# Patient Record
Sex: Male | Born: 1960 | Race: Black or African American | Hispanic: No | Marital: Single | Smoking: Never smoker
Health system: Southern US, Community
[De-identification: ages and names within clinical notes are randomized; demographics above are authoritative.]

## PROBLEM LIST (undated history)

## (undated) DIAGNOSIS — F209 Schizophrenia, unspecified: Secondary | ICD-10-CM

## (undated) DIAGNOSIS — E119 Type 2 diabetes mellitus without complications: Secondary | ICD-10-CM

## (undated) DIAGNOSIS — I10 Essential (primary) hypertension: Secondary | ICD-10-CM

## (undated) DIAGNOSIS — F79 Unspecified intellectual disabilities: Secondary | ICD-10-CM

---

## 2003-10-08 ENCOUNTER — Other Ambulatory Visit: Payer: Self-pay

## 2004-06-18 ENCOUNTER — Inpatient Hospital Stay: Payer: Self-pay | Admitting: Internal Medicine

## 2004-06-18 ENCOUNTER — Other Ambulatory Visit: Payer: Self-pay

## 2005-10-27 ENCOUNTER — Emergency Department: Payer: Self-pay | Admitting: Emergency Medicine

## 2005-10-27 ENCOUNTER — Other Ambulatory Visit: Payer: Self-pay

## 2006-01-13 ENCOUNTER — Inpatient Hospital Stay: Payer: Self-pay | Admitting: Psychiatry

## 2006-09-28 ENCOUNTER — Emergency Department: Payer: Self-pay | Admitting: General Practice

## 2006-09-28 ENCOUNTER — Other Ambulatory Visit: Payer: Self-pay

## 2006-10-13 ENCOUNTER — Other Ambulatory Visit: Payer: Self-pay

## 2006-10-13 ENCOUNTER — Emergency Department: Payer: Self-pay | Admitting: Internal Medicine

## 2007-06-23 ENCOUNTER — Other Ambulatory Visit: Payer: Self-pay

## 2007-06-23 ENCOUNTER — Emergency Department: Payer: Self-pay | Admitting: Emergency Medicine

## 2008-01-04 ENCOUNTER — Emergency Department: Payer: Self-pay | Admitting: Emergency Medicine

## 2008-01-04 ENCOUNTER — Other Ambulatory Visit: Payer: Self-pay

## 2008-01-18 ENCOUNTER — Other Ambulatory Visit: Payer: Self-pay

## 2008-01-18 ENCOUNTER — Emergency Department: Payer: Self-pay | Admitting: Emergency Medicine

## 2008-01-19 ENCOUNTER — Other Ambulatory Visit: Payer: Self-pay

## 2008-01-19 ENCOUNTER — Inpatient Hospital Stay: Payer: Self-pay | Admitting: Internal Medicine

## 2009-06-27 ENCOUNTER — Emergency Department: Payer: Self-pay | Admitting: Emergency Medicine

## 2010-01-26 ENCOUNTER — Emergency Department: Payer: Self-pay | Admitting: Emergency Medicine

## 2010-09-10 ENCOUNTER — Emergency Department: Payer: Self-pay | Admitting: Emergency Medicine

## 2011-10-03 ENCOUNTER — Ambulatory Visit: Payer: Self-pay | Admitting: Pain Medicine

## 2011-12-05 ENCOUNTER — Ambulatory Visit: Payer: Self-pay | Admitting: Pain Medicine

## 2012-01-29 ENCOUNTER — Ambulatory Visit: Payer: Self-pay | Admitting: Pain Medicine

## 2012-05-07 ENCOUNTER — Ambulatory Visit: Payer: Self-pay | Admitting: Pain Medicine

## 2012-07-29 ENCOUNTER — Ambulatory Visit: Payer: Self-pay | Admitting: Pain Medicine

## 2012-12-09 ENCOUNTER — Ambulatory Visit: Payer: Self-pay | Admitting: Pain Medicine

## 2012-12-31 ENCOUNTER — Ambulatory Visit: Payer: Self-pay | Admitting: Pain Medicine

## 2014-06-01 ENCOUNTER — Emergency Department: Payer: Self-pay | Admitting: Emergency Medicine

## 2014-06-01 LAB — COMPREHENSIVE METABOLIC PANEL
Albumin: 3.4 g/dL (ref 3.4–5.0)
Alkaline Phosphatase: 111 U/L
Anion Gap: 4 — ABNORMAL LOW (ref 7–16)
BILIRUBIN TOTAL: 0.4 mg/dL (ref 0.2–1.0)
BUN: 22 mg/dL — ABNORMAL HIGH (ref 7–18)
CALCIUM: 8.9 mg/dL (ref 8.5–10.1)
Chloride: 100 mmol/L (ref 98–107)
Co2: 35 mmol/L — ABNORMAL HIGH (ref 21–32)
Creatinine: 1.46 mg/dL — ABNORMAL HIGH (ref 0.60–1.30)
EGFR (African American): >60
GFR CALC NON AF AMER: 54 — AB
Glucose: 188 mg/dL — ABNORMAL HIGH (ref 65–99)
Osmolality: 286 (ref 275–301)
Potassium: 3.6 mmol/L (ref 3.5–5.1)
SGOT(AST): 23 U/L (ref 15–37)
SGPT (ALT): 19 U/L
Sodium: 139 mmol/L (ref 136–145)
Total Protein: 7.8 g/dL (ref 6.4–8.2)

## 2014-06-01 LAB — CBC WITH DIFFERENTIAL/PLATELET
Basophil #: 0 10*3/uL (ref 0.0–0.1)
Basophil %: 0.2 %
EOS PCT: 0.1 %
Eosinophil #: 0 10*3/uL (ref 0.0–0.7)
HCT: 39.2 % — ABNORMAL LOW (ref 40.0–52.0)
HGB: 12.6 g/dL — AB (ref 13.0–18.0)
Lymphocyte #: 1.5 10*3/uL (ref 1.0–3.6)
Lymphocyte %: 11.5 %
MCH: 29.8 pg (ref 26.0–34.0)
MCHC: 32 g/dL (ref 32.0–36.0)
MCV: 93 fL (ref 80–100)
Monocyte #: 1.1 x10 3/mm — ABNORMAL HIGH (ref 0.2–1.0)
Monocyte %: 8.4 %
NEUTROS ABS: 10.2 10*3/uL — AB (ref 1.4–6.5)
Neutrophil %: 79.8 %
Platelet: 191 10*3/uL (ref 150–440)
RBC: 4.22 10*6/uL — AB (ref 4.40–5.90)
RDW: 14.4 % (ref 11.5–14.5)
WBC: 12.8 10*3/uL — ABNORMAL HIGH (ref 3.8–10.6)

## 2014-06-01 LAB — URINALYSIS, COMPLETE
Bacteria: NONE SEEN
Bilirubin,UR: NEGATIVE
Ketone: NEGATIVE
Leukocyte Esterase: NEGATIVE
Nitrite: NEGATIVE
PH: 5 (ref 4.5–8.0)
PROTEIN: NEGATIVE
RBC,UR: <1 /HPF (ref 0–5)
SPECIFIC GRAVITY: 1.006 (ref 1.003–1.030)
SQUAMOUS EPITHELIAL: NONE SEEN
WBC UR: NONE SEEN /HPF (ref 0–5)

## 2017-02-06 ENCOUNTER — Encounter: Payer: Self-pay | Admitting: Emergency Medicine

## 2017-02-06 ENCOUNTER — Emergency Department
Admission: EM | Admit: 2017-02-06 | Discharge: 2017-02-06 | Disposition: A | Discharge status: Home / Self Care | Payer: Medicare Other | Attending: Emergency Medicine | Admitting: Emergency Medicine

## 2017-02-06 DIAGNOSIS — E119 Type 2 diabetes mellitus without complications: Secondary | ICD-10-CM | POA: Diagnosis not present

## 2017-02-06 DIAGNOSIS — Z7982 Long term (current) use of aspirin: Secondary | ICD-10-CM | POA: Insufficient documentation

## 2017-02-06 DIAGNOSIS — Z794 Long term (current) use of insulin: Secondary | ICD-10-CM | POA: Insufficient documentation

## 2017-02-06 DIAGNOSIS — I10 Essential (primary) hypertension: Secondary | ICD-10-CM | POA: Diagnosis not present

## 2017-02-06 DIAGNOSIS — Z79899 Other long term (current) drug therapy: Secondary | ICD-10-CM | POA: Diagnosis not present

## 2017-02-06 DIAGNOSIS — X30XXXA Exposure to excessive natural heat, initial encounter: Secondary | ICD-10-CM | POA: Diagnosis not present

## 2017-02-06 DIAGNOSIS — E860 Dehydration: Secondary | ICD-10-CM | POA: Diagnosis present

## 2017-02-06 DIAGNOSIS — F79 Unspecified intellectual disabilities: Secondary | ICD-10-CM | POA: Insufficient documentation

## 2017-02-06 DIAGNOSIS — T679XXA Effect of heat and light, unspecified, initial encounter: Secondary | ICD-10-CM

## 2017-02-06 HISTORY — DX: Type 2 diabetes mellitus without complications: E11.9

## 2017-02-06 HISTORY — DX: Essential (primary) hypertension: I10

## 2017-02-06 HISTORY — DX: Unspecified intellectual disabilities: F79

## 2017-02-06 LAB — CBC
HEMATOCRIT: 36.6 % — AB (ref 40.0–52.0)
HEMOGLOBIN: 12.3 g/dL — AB (ref 13.0–18.0)
MCH: 30.4 pg (ref 26.0–34.0)
MCHC: 33.4 g/dL (ref 32.0–36.0)
MCV: 90.9 fL (ref 80.0–100.0)
Platelets: 234 10*3/uL (ref 150–440)
RBC: 4.03 MIL/uL — ABNORMAL LOW (ref 4.40–5.90)
RDW: 14.9 % — AB (ref 11.5–14.5)
WBC: 5.3 10*3/uL (ref 3.8–10.6)

## 2017-02-06 LAB — BASIC METABOLIC PANEL
ANION GAP: 6 (ref 5–15)
BUN: 23 mg/dL — AB (ref 6–20)
CALCIUM: 9.4 mg/dL (ref 8.9–10.3)
CO2: 29 mmol/L (ref 22–32)
Chloride: 100 mmol/L — ABNORMAL LOW (ref 101–111)
Creatinine, Ser: 1.26 mg/dL — ABNORMAL HIGH (ref 0.61–1.24)
GFR calc Af Amer: >60 mL/min (ref >60)
Glucose, Bld: 228 mg/dL — ABNORMAL HIGH (ref 65–99)
Potassium: 4.3 mmol/L (ref 3.5–5.1)
Sodium: 135 mmol/L (ref 135–145)

## 2017-02-06 LAB — TROPONIN I: Troponin I: <0.03 ng/mL (ref <0.03)

## 2017-02-06 MED ORDER — ONDANSETRON HCL 4 MG/2ML IJ SOLN
4.0000 mg | Freq: Once | INTRAMUSCULAR | Status: AC
  Administered 2017-02-06: 4 mg via INTRAVENOUS
  Filled 2017-02-06: qty 2

## 2017-02-06 MED ORDER — SODIUM CHLORIDE 0.9 % IV BOLUS (SEPSIS)
1000.0000 mL | Freq: Once | INTRAVENOUS | Status: AC
  Administered 2017-02-06: 1000 mL via INTRAVENOUS

## 2017-02-06 NOTE — ED Notes (Signed)
Pt left with caregiver in wheelchair.

## 2017-02-06 NOTE — Discharge Instructions (Signed)
Stay well-hydrated be very careful in hot environments, and return to the emergency room for any new or worrisome symptoms

## 2017-02-06 NOTE — ED Provider Notes (Signed)
-----------------------------------------   4:27 PM on 02/06/2017 -----------------------------------------  Administration in no acute distress, he probably was outside in the heat. He had no complaints but family is worried that he might be dehydrated. The plan, according to prior provider, is a discharge patient if he is asymptomatic and his blood work is reassuring, blood work is reassuring, he is in no acute distress. He has not given us a urine sample. I have asked the family if they want to stay for one and they do not. They're requesting immediate discharge. Given that he is well-appearing with no further symptoms we will discharge at the family's request with close outpatient follow-up and instructions to continue hydration at home.   Jeanmarie PlantMcShane, Yosef Krogh A, MD 02/06/17 [phone]

## 2017-02-06 NOTE — ED Notes (Signed)
pts caregiver standing in doorway, states "we are ready to go home, he is better", Dr. Alphonzo LemmingsMcShane made aware

## 2017-02-06 NOTE — ED Notes (Signed)
EDP at bedside  

## 2017-02-06 NOTE — ED Provider Notes (Signed)
[facility] Emergency Department Provider Note  ____________________________________________  Time seen: Approximately 2:25 PM  I have reviewed the triage vital signs and the nursing notes.   HISTORY  Chief Complaint Dehydration  Level 5 caveat:  Portions of the history and physical were unable to be obtained due to mental retardation   HPI Larry Cook is a 56 y.o. male with a history of mental disability, diabetes and hypertension who presents with his caregiver/sister for concerns of dehydration. According to the sister patient snuck out of the house today and spent the whole day outside in the 90 heat. She noticed that patient was not acting like himself and seemed a little bit more confused than his baseline which prompted his visit to the emergency room. Patient denies any complaints at this time. He denies headache, chest pain, shortness of breath, abdominal pain, nausea, vomiting, dysuria hematuria. According to the caretaker patient had one episode of vomiting yesterday however that has resolved. He has had normal appetite and has eaten normally today.  Past Medical History:  Diagnosis Date  . Diabetes mellitus without complication (HCC)   . Hypertension   . Mentally disabled     There are no active problems to display for this patient.   History reviewed. No pertinent surgical history.  Prior to Admission medications   Medication Sig Start Date End Date Taking? Authorizing Provider  albuterol (PROVENTIL) (2.5 MG/3ML) 0.083% nebulizer solution Inhale 3 mLs into the lungs every 4 (four) hours as needed for wheezing. 12/12/16  Yes [provider]  ALPRAZolam (XANAX) 1 MG tablet Take 1 mg by mouth 3 (three) times daily as needed for sleep. 12/23/16  Yes [provider]  amLODipine (NORVASC) 5 MG tablet Take 5 mg by mouth daily. 01/30/17  Yes [provider]  aspirin EC 81 MG tablet Take 81 mg by mouth daily.   Yes  [provider]  gabapentin (NEURONTIN) 100 MG capsule Take 100 mg by mouth 2 (two) times daily. 12/12/16  Yes [provider]  HYDROcodone-acetaminophen (NORCO) 10-325 MG tablet Take 1 tablet by mouth every 6 (six) hours as needed for pain. 01/08/17  Yes [provider]  insulin glargine (LANTUS) 100 UNIT/ML injection Inject 20 Units into the skin at bedtime.   Yes [provider]  lovastatin (MEVACOR) 40 MG tablet Take 40 mg by mouth daily with supper. 01/03/17  Yes [provider]  meloxicam (MOBIC) 7.5 MG tablet Take 7.5 mg by mouth at bedtime. 01/30/17  Yes [provider]  metFORMIN (GLUCOPHAGE) 1000 MG tablet Take 1,000 mg by mouth 2 (two) times daily. 01/03/17  Yes [provider]  Olopatadine HCl 0.2 % SOLN Place 1 drop into both eyes daily. 01/28/17  Yes [provider]  omeprazole (PRILOSEC) 20 MG capsule Take 20 mg by mouth 2 (two) times daily. 01/30/17  Yes [provider]  pramipexole (MIRAPEX) 0.25 MG tablet Take 0.25 mg by mouth at bedtime. 05/06/15  Yes [provider]    Allergies Patient has no known allergies.  No family history on file.  Social History Social History  Substance Use Topics  . Smoking status: Never Smoker  . Smokeless tobacco: Never Used  . Alcohol use No    Review of Systems  Constitutional: Negative for fever. + confused Eyes: Negative for visual changes. ENT: Negative for sore throat. Neck: No neck pain  Cardiovascular: Negative for chest pain. Respiratory: Negative for shortness of breath. Gastrointestinal: Negative for abdominal pain,  diarrhea. + vomiting Genitourinary: Negative for dysuria. Musculoskeletal: Negative for back pain. Skin: Negative for rash. Neurological: Negative for headaches, weakness or numbness. Psych: No SI or HI  ____________________________________________   PHYSICAL EXAM:  VITAL SIGNS: ED Triage Vitals [02/06/17 1345]  Enc  Vitals Group     BP (!) 154/89     Pulse Rate 86     Resp 20     Temp 97.7 F (36.5 C)     Temp Source Oral     SpO2 97 %     Weight 206 lb (93.4 kg)     Height 4' (1.219 m)     Head Circumference      Peak Flow      Pain Score      Pain Loc      Pain Edu?      Excl. in GC?     Constitutional: Alert and oriented x2 which is baseline, when I asked him if he knew where he was he  Looked at me and said "yes, I am in the hospital". no apparent distress. HEENT:      Head: Normocephalic and atraumatic.         Eyes: Conjunctivae are normal. Sclera is non-icteric.       Mouth/Throat: Mucous membranes are moist.       Neck: Supple with no signs of meningismus. Cardiovascular: Regular rate and rhythm. No murmurs, gallops, or rubs. 2+ symmetrical distal pulses are present in all extremities. No JVD. Respiratory: Normal respiratory effort. Lungs are clear to auscultation bilaterally. No wheezes, crackles, or rhonchi.  Gastrointestinal: Soft, non tender, and non distended with positive bowel sounds. No rebound or guarding. Genitourinary: No CVA tenderness. Musculoskeletal: Nontender with normal range of motion in all extremities. No edema, cyanosis, or erythema of extremities. Neurologic: Normal speech and language. Face is symmetric. Moving all extremities. No gross focal neurologic deficits are appreciated. Skin: Skin is warm, dry and intact. No rash noted. Psychiatric: Mood and affect are normal. Speech and behavior are normal.  ____________________________________________   LABS (all labs ordered are listed, but only abnormal results are displayed)  Labs Reviewed  BASIC METABOLIC PANEL - Abnormal; Notable for the following:       Result Value   Chloride 100 (*)    Glucose, Bld 228 (*)    BUN 23 (*)    Creatinine, Ser 1.26 (*)    All other components within normal limits  CBC - Abnormal; Notable for the following:    RBC 4.03 (*)    Hemoglobin 12.3 (*)    HCT 36.6 (*)    RDW  14.9 (*)    All other components within normal limits  TROPONIN I   ____________________________________________  EKG  ED ECG REPORT I, Nita Sickle, the attending physician, personally viewed and interpreted this ECG.  Sinus rhythm, rate of 86, right bundle branch block, normal QTC, left axis deviation, no ST elevations or depressions. EKG is unchanged from prior from 2009 ____________________________________________  RADIOLOGY  none  ____________________________________________   PROCEDURES  Procedure(s) performed: None Procedures Critical Care performed:  None ____________________________________________   INITIAL IMPRESSION / ASSESSMENT AND PLAN / ED COURSE  56 y.o. male with a history of mental disability, diabetes and hypertension who presents with his caregiver/sister for concerns of dehydration. Patient is extremely well appearing, pleasant, smiling, has no complaints at this time, his vitals are within normal limits, his face symmetric, is moving all of his extremities and following commands. Abdomen is soft  and nontender, lungs are clear to auscultation. We'll give IV fluids and check basic blood work including urinalysis for any signs of dehydration or infection.    Care transferred to Dr. Alphonzo LemmingsMcShane at [facility] with labs pending     Pertinent labs & imaging results that were available during my care of the patient were reviewed by me and considered in my medical decision making (see chart for details).    ____________________________________________   FINAL CLINICAL IMPRESSION(S) / ED DIAGNOSES  Final diagnoses:  Heat exposure, initial encounter      NEW MEDICATIONS STARTED DURING THIS VISIT:  Discharge Medication List as of 02/06/2017  4:29 PM       Note:  This document was prepared using Dragon voice recognition software and may include unintentional dictation errors.    Don PerkingVeronese, [place], MD 02/09/17 1146

## 2017-02-06 NOTE — ED Triage Notes (Signed)
Caregiver states that pt may be dehydrated from being out in the sun too much. States he is not acting himself.  Pt with disability.

## 2017-02-06 NOTE — ED Notes (Signed)
Pt resting in bed, resp even and unlabored, denies any needs, caregiver states "he looks better, his color is coming back"

## 2017-02-06 NOTE — Progress Notes (Signed)
[facility] rounding unit visited pt. Pt was lying in the bed and sister was at the bedside. Pt's sister said she has been taking care of her brother for 15 years. Pt have difficulty hearing so CH was not able to communicate with him. Pt's sister talked about how she raised 2 adopted children; one is in college and the other one just graduated from high school recently. CH offered prayers for pt and for sister and then left.

## 2017-04-23 ENCOUNTER — Emergency Department
Admission: EM | Admit: 2017-04-23 | Discharge: 2017-04-23 | Disposition: A | Discharge status: Home / Self Care | Payer: Medicare Other | Attending: Emergency Medicine | Admitting: Emergency Medicine

## 2017-04-23 ENCOUNTER — Encounter: Payer: Self-pay | Admitting: Emergency Medicine

## 2017-04-23 DIAGNOSIS — R4182 Altered mental status, unspecified: Secondary | ICD-10-CM | POA: Diagnosis not present

## 2017-04-23 DIAGNOSIS — I10 Essential (primary) hypertension: Secondary | ICD-10-CM | POA: Diagnosis not present

## 2017-04-23 DIAGNOSIS — M5431 Sciatica, right side: Secondary | ICD-10-CM | POA: Insufficient documentation

## 2017-04-23 DIAGNOSIS — E119 Type 2 diabetes mellitus without complications: Secondary | ICD-10-CM | POA: Insufficient documentation

## 2017-04-23 LAB — URINALYSIS, COMPLETE (UACMP) WITH MICROSCOPIC
Bacteria, UA: NONE SEEN
Bilirubin Urine: NEGATIVE
GLUCOSE, UA: NEGATIVE mg/dL
HGB URINE DIPSTICK: NEGATIVE
KETONES UR: NEGATIVE mg/dL
Leukocytes, UA: NEGATIVE
NITRITE: NEGATIVE
PH: 5 (ref 5.0–8.0)
Protein, ur: 100 mg/dL — AB
Specific Gravity, Urine: 1.021 (ref 1.005–1.030)

## 2017-04-23 LAB — COMPREHENSIVE METABOLIC PANEL
ALT: 23 U/L (ref 17–63)
ANION GAP: 10 (ref 5–15)
AST: 31 U/L (ref 15–41)
Albumin: 4.1 g/dL (ref 3.5–5.0)
Alkaline Phosphatase: 77 U/L (ref 38–126)
BUN: 17 mg/dL (ref 6–20)
CHLORIDE: 100 mmol/L — AB (ref 101–111)
CO2: 29 mmol/L (ref 22–32)
Calcium: 9.5 mg/dL (ref 8.9–10.3)
Creatinine, Ser: 1.38 mg/dL — ABNORMAL HIGH (ref 0.61–1.24)
GFR, EST NON AFRICAN AMERICAN: 56 mL/min — AB (ref >60)
Glucose, Bld: 220 mg/dL — ABNORMAL HIGH (ref 65–99)
POTASSIUM: 4.1 mmol/L (ref 3.5–5.1)
Sodium: 139 mmol/L (ref 135–145)
Total Bilirubin: 0.5 mg/dL (ref 0.3–1.2)
Total Protein: 7.5 g/dL (ref 6.5–8.1)

## 2017-04-23 LAB — CBC
HEMATOCRIT: 37.8 % — AB (ref 40.0–52.0)
HEMOGLOBIN: 12.6 g/dL — AB (ref 13.0–18.0)
MCH: 30.5 pg (ref 26.0–34.0)
MCHC: 33.4 g/dL (ref 32.0–36.0)
MCV: 91.4 fL (ref 80.0–100.0)
Platelets: 223 10*3/uL (ref 150–440)
RBC: 4.14 MIL/uL — AB (ref 4.40–5.90)
RDW: 14.2 % (ref 11.5–14.5)
WBC: 5.4 10*3/uL (ref 3.8–10.6)

## 2017-04-23 LAB — GLUCOSE, CAPILLARY: Glucose-Capillary: 209 mg/dL — ABNORMAL HIGH (ref 65–99)

## 2017-04-23 LAB — TROPONIN I

## 2017-04-23 NOTE — ED Notes (Signed)
Pt caregiver states that he has been disoriented today and that he is having problems with his sciatic nerve today - she states that the pt has stood in the sun today and she thinks this may be making him disoriented - caregiver states that pt will not communicate with staff like he normally does

## 2017-04-23 NOTE — ED Provider Notes (Signed)
[facility] Emergency Department Provider Note  ____________________________________________   First MD Initiated Contact with Patient 04/23/17 2124     (approximate)  I have reviewed the triage vital signs and the nursing notes.   HISTORY  Chief Complaint Altered Mental Status   HPI Larry Cook is a 56 y.o. male with history of diabetes, hypertension and cognitive disability who is presenting to the emergency department today with altered mental status as well as suspicion of pain from right-sided sciatica. He is accompanied by his sister who is also his caretaker was concerned that he may be dehydrated from standing out in the sun for the past several days not drink enough water. She says that he has been fatigued from this in the past. She says that he is drinking and eating normally. No diarrhea. Today he was crying earlier today and she was suspecting this could be from right-sided sciatica for which he is using Vicodin intermittently.   Past Medical History:  Diagnosis Date  . Diabetes mellitus without complication (HCC)   . Hypertension   . Mentally disabled     There are no active problems to display for this patient.   History reviewed. No pertinent surgical history.  Prior to Admission medications   Medication Sig Start Date End Date Taking? Authorizing Provider  albuterol (PROVENTIL) (2.5 MG/3ML) 0.083% nebulizer solution Inhale 3 mLs into the lungs every 4 (four) hours as needed for wheezing. 12/12/16   [provider]  ALPRAZolam Prudy Feeler(XANAX) 1 MG tablet Take 1 mg by mouth 3 (three) times daily as needed for sleep. 12/23/16   [provider]  amLODipine (NORVASC) 5 MG tablet Take 5 mg by mouth daily. 01/30/17   [provider]  aspirin EC 81 MG tablet Take 81 mg by mouth daily.    [provider]  gabapentin (NEURONTIN) 100 MG capsule Take 100 mg by mouth 2 (two) times daily. 12/12/16   [provider]    HYDROcodone-acetaminophen (NORCO) 10-325 MG tablet Take 1 tablet by mouth every 6 (six) hours as needed for pain. 01/08/17   [provider]  insulin glargine (LANTUS) 100 UNIT/ML injection Inject 20 Units into the skin at bedtime.    [provider]  lovastatin (MEVACOR) 40 MG tablet Take 40 mg by mouth daily with supper. 01/03/17   [provider]  meloxicam (MOBIC) 7.5 MG tablet Take 7.5 mg by mouth at bedtime. 01/30/17   [provider]  metFORMIN (GLUCOPHAGE) 1000 MG tablet Take 1,000 mg by mouth 2 (two) times daily. 01/03/17   [provider]  Olopatadine HCl 0.2 % SOLN Place 1 drop into both eyes daily. 01/28/17   [provider]  omeprazole (PRILOSEC) 20 MG capsule Take 20 mg by mouth 2 (two) times daily. 01/30/17   [provider]  pramipexole (MIRAPEX) 0.25 MG tablet Take 0.25 mg by mouth at bedtime. 05/06/15   [provider]    Allergies Patient has no known allergies.  No family history on file.  Social History Social History  Substance Use Topics  . Smoking status: Never Smoker  . Smokeless tobacco: Never Used  . Alcohol use No    Review of Systems  Level V caveat secondary to patient minimally verbal and with mental disability.   ____________________________________________   PHYSICAL EXAM:  VITAL SIGNS: ED Triage Vitals  Enc Vitals Group     BP 04/23/17 1947 (!) 153/91     Pulse Rate 04/23/17 1947 90  Resp 04/23/17 1947 20     Temp 04/23/17 1947 99.3 F (37.4 C)     Temp Source 04/23/17 1947 Oral     SpO2 04/23/17 1947 97 %     Weight 04/23/17 1948 210 lb (95.3 kg)     Height 04/23/17 1948 4' (1.219 m)     Head Circumference --      Peak Flow --      Pain Score --      Pain Loc --      Pain Edu? --      Excl. in GC? --     Constitutional: Alert and in no acute distress. Eyes: Conjunctivae are normal.  Head: Atraumatic. Nose: No congestion/rhinnorhea. Mouth/Throat: Mucous  membranes are moist.  Neck: No stridor.   Cardiovascular: Normal rate, regular rhythm. Grossly normal heart sounds.  Respiratory: Normal respiratory effort.  No retractions. Lungs CTAB. Gastrointestinal: Soft and nontender. No distention.  Musculoskeletal: No lower extremity tenderness nor edema.  No joint effusions. Neurologic:   No gross focal neurologic deficits are appreciated. Skin:  Skin is warm, dry and intact. No rash noted. Psychiatric: Mood and affect are normal. Speech and behavior are normal.  ____________________________________________   LABS (all labs ordered are listed, but only abnormal results are displayed)  Labs Reviewed  COMPREHENSIVE METABOLIC PANEL - Abnormal; Notable for the following:       Result Value   Chloride 100 (*)    Glucose, Bld 220 (*)    Creatinine, Ser 1.38 (*)    GFR calc non Af Amer 56 (*)    All other components within normal limits  CBC - Abnormal; Notable for the following:    RBC 4.14 (*)    Hemoglobin 12.6 (*)    HCT 37.8 (*)    All other components within normal limits  URINALYSIS, COMPLETE (UACMP) WITH MICROSCOPIC - Abnormal; Notable for the following:    Color, Urine YELLOW (*)    APPearance CLEAR (*)    Protein, ur 100 (*)    Squamous Epithelial / LPF 0-5 (*)    All other components within normal limits  GLUCOSE, CAPILLARY - Abnormal; Notable for the following:    Glucose-Capillary 209 (*)    All other components within normal limits  TROPONIN I  CBG MONITORING, ED   ____________________________________________  EKG  ED ECG REPORT I, Arelia Longest, the attending physician, personally viewed and interpreted this ECG.   Date: 04/23/2017  EKG Time: 2026  Rate: 81  Rhythm: normal sinus rhythm  Axis: Normal  Intervals:Bifascicular block  ST&T Change: No ST segment elevation or depression. No abnormal T-wave inversion. No significant change from  previous.  ____________________________________________  RADIOLOGY   ____________________________________________   PROCEDURES  Procedure(s) performed:   Procedures  Critical Care performed:   ____________________________________________   INITIAL IMPRESSION / ASSESSMENT AND PLAN / ED COURSE  Pertinent labs & imaging results that were available during my care of the patient were reviewed by me and considered in my medical decision making (see chart for details).  Reassuring blood work. Patient with a negative straight leg raise bilaterally. No tenderness to the low lumbar region. Sister says that he is acting at his baseline at this time. Unclear etiology of the altered mental status. However, the condition appears to be resolved. We discussed keeping the patient out of the sauna make sure these drinking plenty of fluids as it will also be out the next several days. The patient has a follow-up appointment  scheduled with Dr. Judithann Sheen on September 19. However, the patient's sister says that she'll attempt to have it moved up closer. She is understanding of the plan and willing to comply with discharge. She understands to bring the patient back for any worsening or concerning symptoms.      ____________________________________________   FINAL CLINICAL IMPRESSION(S) / ED DIAGNOSES  Altered mental status, resolved   NEW MEDICATIONS STARTED DURING THIS VISIT:  New Prescriptions   No medications on file     Note:  This document was prepared using Dragon voice recognition software and may include unintentional dictation errors.     Myrna Blazer, MD 04/23/17 2218

## 2017-04-23 NOTE — ED Triage Notes (Signed)
Patient to ER with sister (caregiver) for c/o AMS. Patient has h/o mental disability, but "has not been acting right" last couple of days. Patient's sister states patient has seemed more lethargic than normal and BP was 181/104 with HR of 9 at home. Blood sugar was under 200 at home.

## 2018-01-04 ENCOUNTER — Emergency Department: Payer: Medicare Other

## 2018-01-04 ENCOUNTER — Emergency Department
Admission: EM | Admit: 2018-01-04 | Discharge: 2018-01-05 | Disposition: A | Discharge status: Home / Self Care | Payer: Medicare Other | Attending: Emergency Medicine | Admitting: Emergency Medicine

## 2018-01-04 DIAGNOSIS — I10 Essential (primary) hypertension: Secondary | ICD-10-CM | POA: Diagnosis not present

## 2018-01-04 DIAGNOSIS — F919 Conduct disorder, unspecified: Secondary | ICD-10-CM | POA: Diagnosis not present

## 2018-01-04 DIAGNOSIS — Z794 Long term (current) use of insulin: Secondary | ICD-10-CM | POA: Insufficient documentation

## 2018-01-04 DIAGNOSIS — E119 Type 2 diabetes mellitus without complications: Secondary | ICD-10-CM | POA: Insufficient documentation

## 2018-01-04 DIAGNOSIS — R410 Disorientation, unspecified: Secondary | ICD-10-CM | POA: Diagnosis not present

## 2018-01-04 DIAGNOSIS — R462 Strange and inexplicable behavior: Secondary | ICD-10-CM

## 2018-01-04 HISTORY — DX: Schizophrenia, unspecified: F20.9

## 2018-01-04 LAB — COMPREHENSIVE METABOLIC PANEL
ALT: 21 U/L (ref 17–63)
AST: 35 U/L (ref 15–41)
Albumin: 4.2 g/dL (ref 3.5–5.0)
Alkaline Phosphatase: 85 U/L (ref 38–126)
Anion gap: 10 (ref 5–15)
BUN: 21 mg/dL — AB (ref 6–20)
CHLORIDE: 102 mmol/L (ref 101–111)
CO2: 25 mmol/L (ref 22–32)
CREATININE: 1.39 mg/dL — AB (ref 0.61–1.24)
Calcium: 9.3 mg/dL (ref 8.9–10.3)
GFR calc Af Amer: >60 mL/min (ref >60)
GFR calc non Af Amer: 55 mL/min — ABNORMAL LOW (ref >60)
Glucose, Bld: 111 mg/dL — ABNORMAL HIGH (ref 65–99)
Potassium: 4.1 mmol/L (ref 3.5–5.1)
Sodium: 137 mmol/L (ref 135–145)
Total Bilirubin: 0.7 mg/dL (ref 0.3–1.2)
Total Protein: 7.8 g/dL (ref 6.5–8.1)

## 2018-01-04 LAB — URINALYSIS, ROUTINE W REFLEX MICROSCOPIC
Bacteria, UA: NONE SEEN
Bilirubin Urine: NEGATIVE
Glucose, UA: NEGATIVE mg/dL
Hgb urine dipstick: NEGATIVE
Ketones, ur: NEGATIVE mg/dL
Leukocytes, UA: NEGATIVE
Nitrite: NEGATIVE
PH: 5 (ref 5.0–8.0)
Protein, ur: 30 mg/dL — AB
SPECIFIC GRAVITY, URINE: 1.014 (ref 1.005–1.030)
SQUAMOUS EPITHELIAL / LPF: NONE SEEN (ref 0–5)
WBC, UA: NONE SEEN WBC/hpf (ref 0–5)

## 2018-01-04 LAB — CBC
HCT: 36.8 % — ABNORMAL LOW (ref 40.0–52.0)
Hemoglobin: 12.3 g/dL — ABNORMAL LOW (ref 13.0–18.0)
MCH: 30.8 pg (ref 26.0–34.0)
MCHC: 33.3 g/dL (ref 32.0–36.0)
MCV: 92.3 fL (ref 80.0–100.0)
PLATELETS: 246 10*3/uL (ref 150–440)
RBC: 3.98 MIL/uL — ABNORMAL LOW (ref 4.40–5.90)
RDW: 14.7 % — AB (ref 11.5–14.5)
WBC: 6.1 10*3/uL (ref 3.8–10.6)

## 2018-01-04 NOTE — ED Notes (Addendum)
Pt here with sister who says pt is confused over the last few days; yesterday she told him to change after being incontinent and pt put on 2 pair of underwear; she says today he'd go into the bathroom and she'd have to go in to check on him and see what he's doing-she'd find him just standing in the bathroom but had used the bathroom on the floor; pt ambulatory with steady gait;

## 2018-01-05 ENCOUNTER — Encounter: Payer: Self-pay | Admitting: Emergency Medicine

## 2018-01-05 LAB — BLOOD GAS, VENOUS
ACID-BASE EXCESS: 7.1 mmol/L — AB (ref 0.0–2.0)
Bicarbonate: 34.1 mmol/L — ABNORMAL HIGH (ref 20.0–28.0)
O2 SAT: 65.2 %
PATIENT TEMPERATURE: 37
pCO2, Ven: 59 mmHg (ref 44.0–60.0)
pH, Ven: 7.37 (ref 7.250–7.430)
pO2, Ven: 35 mmHg (ref 32.0–45.0)

## 2018-01-05 LAB — AMMONIA: AMMONIA: 24 umol/L (ref 9–35)

## 2018-01-05 NOTE — ED Provider Notes (Signed)
[facility] Emergency Department Provider Note   ____________________________________________   First MD Initiated Contact with Patient 01/04/18 2304     (approximate)  I have reviewed the triage vital signs and the nursing notes.   HISTORY  Chief Complaint Altered Mental Status  Patient with a history of mental disability, history obtained from family member.  HPI Thanos Cousineau is a 57 y.o. male who comes into the hospital today with some confusion.  The patient's family states that he has not been acting right for the past 4 or 5 days.  The family states that the patient has some mental disability but when she tries to feed him sometimes he seems like he wants to reach for food but is not eating as well as he normally does.  She also reports that he has been urinating on the floor and when she told him to change his underwear he put them on over his soiled underwear.  The patient also seems to be standing and staring at the wall more often.  He has not had any fevers but has complained of some intermittent headaches.  The patient's family states that he takes in [place] shots once a month and about a month ago they increased his dose.  She states that she is not sure if the medication has anything to do with his behavior but she wanted to bring him in for evaluation.  The family states that sometimes he is verbal but not fully.  She also reports that he does not hear very well.   Past Medical History:  Diagnosis Date  . Diabetes mellitus without complication (HCC)   . Hypertension   . Mentally disabled   . Schizophrenia (HCC)     There are no active problems to display for this patient.   History reviewed. No pertinent surgical history.  Prior to Admission medications   Medication Sig Start Date End Date Taking? Authorizing Provider  albuterol (PROVENTIL) (2.5 MG/3ML) 0.083% nebulizer solution Inhale 3 mLs into the lungs every 4 (four) hours as needed for  wheezing. 12/12/16   [provider]  ALPRAZolam Prudy Feeler) 1 MG tablet Take 1 mg by mouth 3 (three) times daily as needed for sleep. 12/23/16   [provider]  amLODipine (NORVASC) 5 MG tablet Take 5 mg by mouth daily. 01/30/17   [provider]  aspirin EC 81 MG tablet Take 81 mg by mouth daily.    [provider]  gabapentin (NEURONTIN) 100 MG capsule Take 100 mg by mouth 2 (two) times daily. 12/12/16   [provider]  HYDROcodone-acetaminophen (NORCO) 10-325 MG tablet Take 1 tablet by mouth every 6 (six) hours as needed for pain. 01/08/17   [provider]  insulin glargine (LANTUS) 100 UNIT/ML injection Inject 20 Units into the skin at bedtime.    [provider]  lovastatin (MEVACOR) 40 MG tablet Take 40 mg by mouth daily with supper. 01/03/17   [provider]  meloxicam (MOBIC) 7.5 MG tablet Take 7.5 mg by mouth at bedtime. 01/30/17   [provider]  metFORMIN (GLUCOPHAGE) 1000 MG tablet Take 1,000 mg by mouth 2 (two) times daily. 01/03/17   [provider]  Olopatadine HCl 0.2 % SOLN Place 1 drop into both eyes daily. 01/28/17   [provider]  omeprazole (PRILOSEC) 20 MG capsule Take 20 mg by mouth 2 (two) times daily. 01/30/17   [provider]  pramipexole (MIRAPEX) 0.25 MG tablet Take 0.25 mg  by mouth at bedtime. 05/06/15   [provider]    Allergies Patient has no known allergies.  No family history on file.  Social History Social History   Tobacco Use  . Smoking status: Never Smoker  . Smokeless tobacco: Never Used  Substance Use Topics  . Alcohol use: No  . Drug use: No    Review of Systems  Constitutional: No fever/chills Eyes: No visual changes. ENT: No sore throat. Cardiovascular: Denies chest pain. Respiratory: Denies shortness of breath. Gastrointestinal: No abdominal pain.  No nausea, no vomiting.  No diarrhea.  No constipation. Genitourinary:  Negative for dysuria. Musculoskeletal: Negative for back pain. Skin: Negative for rash. Neurological: Negative for headaches, focal weakness or numbness. Psych: confusion and bizarre behavior  ____________________________________________   PHYSICAL EXAM:  VITAL SIGNS: ED Triage Vitals [01/04/18 1937]  Enc Vitals Group     BP (!) 143/91     Pulse Rate 99     Resp 18     Temp 97.9 F (36.6 C)     Temp Source Oral     SpO2 96 %     Weight 210 lb (95.3 kg)     Height 4' (1.219 m)     Head Circumference      Peak Flow      Pain Score 0     Pain Loc      Pain Edu?      Excl. in GC?     Constitutional: Alert and oriented to self, Well appearing and in no acute distress. Eyes: Conjunctivae are normal. PERRL. EOMI. Head: Atraumatic. Nose: No congestion/rhinnorhea. Mouth/Throat: Mucous membranes are moist.  Oropharynx non-erythematous. Cardiovascular: Normal rate, regular rhythm. Grossly normal heart sounds.  Good peripheral circulation. Respiratory: Normal respiratory effort.  No retractions. Lungs CTAB. Gastrointestinal: Soft and nontender. No distention. Positive bowel sounds Musculoskeletal: No lower extremity tenderness nor edema.  Neurologic:  Normal speech for patient, mumbling and smiling.  Patient's face is symmetric, strength is equal bilaterally, extraocular muscles intact, facial sensation is intact, patient does have some difficulty following instructions. Skin:  Skin is warm, dry and intact.  Psychiatric: Mood and affect are normal.   ____________________________________________   LABS (all labs ordered are listed, but only abnormal results are displayed)  Labs Reviewed  COMPREHENSIVE METABOLIC PANEL - Abnormal; Notable for the following components:      Result Value   Glucose, Bld 111 (*)    BUN 21 (*)    Creatinine, Ser 1.39 (*)    GFR calc non Af Amer 55 (*)    All other components within normal limits  CBC - Abnormal; Notable for the following  components:   RBC 3.98 (*)    Hemoglobin 12.3 (*)    HCT 36.8 (*)    RDW 14.7 (*)    All other components within normal limits  URINALYSIS, ROUTINE W REFLEX MICROSCOPIC - Abnormal; Notable for the following components:   Color, Urine YELLOW (*)    APPearance CLEAR (*)    Protein, ur 30 (*)    All other components within normal limits  BLOOD GAS, VENOUS - Abnormal; Notable for the following components:   Bicarbonate 34.1 (*)    Acid-Base Excess 7.1 (*)    All other components within normal limits  AMMONIA   ____________________________________________  EKG  ED ECG REPORT I, Rebecka Apley, the attending physician, personally viewed and interpreted this ECG.   Date: 01/05/2018  EKG Time: 2238  Rate: 76  Rhythm:  normal sinus rhythm  Axis: left axis deviation  Intervals:right bundle branch block and left anterior fascicular block  ST&T Change: none  ____________________________________________  RADIOLOGY  ED MD interpretation: CT head: No acute intracranial pathology seen on CT, mild cortical volume loss and scattered small vessel ischemic microangiopathy.  Official radiology report(s): Ct Head Wo Contrast  Result Date: 01/05/2018 CLINICAL DATA:  Acute onset of confusion. Altered mental status. EXAM: CT HEAD WITHOUT CONTRAST TECHNIQUE: Contiguous axial images were obtained from the base of the skull through the vertex without intravenous contrast. COMPARISON:  CT of the head performed 01/19/2008, and MRI of the brain performed 01/20/2008 FINDINGS: Brain: No evidence of acute infarction, hemorrhage, hydrocephalus, extra-axial collection or mass lesion / mass effect. Prominence of the ventricles reflects mild cortical volume loss. Mild periventricular white matter change likely reflects small vessel ischemic microangiopathy. The brainstem and fourth ventricle are within normal limits. The basal ganglia are unremarkable in appearance. The cerebral hemispheres demonstrate  grossly normal gray-white differentiation. No mass effect or midline shift is seen. Vascular: No hyperdense vessel or unexpected calcification. Skull: There is no evidence of fracture; visualized osseous structures are unremarkable in appearance. Sinuses/Orbits: The orbits are within normal limits. The paranasal sinuses and mastoid air cells are well-aerated. Other: No significant soft tissue abnormalities are seen. IMPRESSION: 1. No acute intracranial pathology seen on CT. 2. Mild cortical volume loss and scattered small vessel ischemic microangiopathy. Electronically Signed   By: Roanna Raider M.D.   On: 01/05/2018 00:18    ____________________________________________   PROCEDURES  Procedure(s) performed: None  Procedures  Critical Care performed: No  ____________________________________________   INITIAL IMPRESSION / ASSESSMENT AND PLAN / ED COURSE  As part of my medical decision making, I reviewed the following data within the electronic MEDICAL RECORD NUMBER Notes from prior ED visits and [place] Controlled Substance Database   This is a 57 year old male who comes into the hospital today with some confusion and bizarre behavior over the last 4 to 5 days.  My differential diagnosis includes CVA, intracranial hemorrhage, urinary tract infection, electrolyte abnormalities.  The patient did have some blood work drawn to include a CBC, CMP, blood gas, ammonia, urinalysis the patient's blood work was unremarkable.  The patient had a CT scan that was also unremarkable.  The patient is sitting comfortably and not doing anything outside of his baseline according to family at this time.  It is always possible that his medication may have caused his symptoms but that is something that would need to be evaluated and investigated by the patient's primary care physician.  The patient will be discharged home to follow-up with his primary care physician.       ____________________________________________   FINAL CLINICAL IMPRESSION(S) / ED DIAGNOSES  Final diagnoses:  Confusion  Bizarre behavior     ED Discharge Orders    None       Note:  This document was prepared using Dragon voice recognition software and may include unintentional dictation errors.    Rebecka Apley, MD 01/05/18 [phone]

## 2018-01-05 NOTE — ED Notes (Signed)
Pt up to toilet and able to ambulate without assistance

## 2018-01-05 NOTE — Discharge Instructions (Addendum)
Your work-up this evening is unremarkable.  Please follow-up with your primary care physician as well as her psychiatrist to determine if your increase in medication is causing this change in behavior.  Please return with any worsening conditions or any other concerns.

## 2019-06-23 DIAGNOSIS — N1831 Chronic kidney disease, stage 3a: Secondary | ICD-10-CM | POA: Insufficient documentation

## 2019-06-23 DIAGNOSIS — N183 Chronic kidney disease, stage 3 unspecified: Secondary | ICD-10-CM | POA: Diagnosis present

## 2019-06-23 DIAGNOSIS — E119 Type 2 diabetes mellitus without complications: Secondary | ICD-10-CM

## 2019-12-31 DIAGNOSIS — Z6841 Body Mass Index (BMI) 40.0 and over, adult: Secondary | ICD-10-CM | POA: Insufficient documentation

## 2021-05-16 ENCOUNTER — Other Ambulatory Visit: Payer: Self-pay

## 2021-05-16 ENCOUNTER — Emergency Department (EMERGENCY_DEPARTMENT_HOSPITAL)
Admission: EM | Admit: 2021-05-16 | Discharge: 2021-05-16 | Disposition: A | Discharge status: Other Acute Inpatient Hospital | Payer: Medicare Other | Source: Home / Self Care | Attending: Emergency Medicine | Admitting: Emergency Medicine

## 2021-05-16 ENCOUNTER — Inpatient Hospital Stay
Admission: AD | Admit: 2021-05-16 | Discharge: 2021-05-23 | DRG: 885 | Disposition: A | Discharge status: Home / Self Care | Payer: Medicare Other | Source: Intra-hospital | Attending: Behavioral Health | Admitting: Behavioral Health

## 2021-05-16 ENCOUNTER — Encounter: Payer: Self-pay | Admitting: Psychiatry

## 2021-05-16 DIAGNOSIS — Z20822 Contact with and (suspected) exposure to covid-19: Secondary | ICD-10-CM | POA: Diagnosis present

## 2021-05-16 DIAGNOSIS — N1832 Chronic kidney disease, stage 3b: Secondary | ICD-10-CM | POA: Diagnosis present

## 2021-05-16 DIAGNOSIS — E785 Hyperlipidemia, unspecified: Secondary | ICD-10-CM | POA: Diagnosis present

## 2021-05-16 DIAGNOSIS — E119 Type 2 diabetes mellitus without complications: Secondary | ICD-10-CM | POA: Insufficient documentation

## 2021-05-16 DIAGNOSIS — J449 Chronic obstructive pulmonary disease, unspecified: Secondary | ICD-10-CM | POA: Diagnosis present

## 2021-05-16 DIAGNOSIS — Z794 Long term (current) use of insulin: Secondary | ICD-10-CM

## 2021-05-16 DIAGNOSIS — I129 Hypertensive chronic kidney disease with stage 1 through stage 4 chronic kidney disease, or unspecified chronic kidney disease: Secondary | ICD-10-CM | POA: Diagnosis present

## 2021-05-16 DIAGNOSIS — Z046 Encounter for general psychiatric examination, requested by authority: Secondary | ICD-10-CM | POA: Insufficient documentation

## 2021-05-16 DIAGNOSIS — R4689 Other symptoms and signs involving appearance and behavior: Secondary | ICD-10-CM

## 2021-05-16 DIAGNOSIS — E1122 Type 2 diabetes mellitus with diabetic chronic kidney disease: Secondary | ICD-10-CM | POA: Diagnosis present

## 2021-05-16 DIAGNOSIS — Z7984 Long term (current) use of oral hypoglycemic drugs: Secondary | ICD-10-CM | POA: Insufficient documentation

## 2021-05-16 DIAGNOSIS — G2581 Restless legs syndrome: Secondary | ICD-10-CM | POA: Diagnosis present

## 2021-05-16 DIAGNOSIS — R4182 Altered mental status, unspecified: Secondary | ICD-10-CM | POA: Diagnosis present

## 2021-05-16 DIAGNOSIS — Z7982 Long term (current) use of aspirin: Secondary | ICD-10-CM | POA: Insufficient documentation

## 2021-05-16 DIAGNOSIS — I10 Essential (primary) hypertension: Secondary | ICD-10-CM

## 2021-05-16 DIAGNOSIS — N183 Chronic kidney disease, stage 3 unspecified: Secondary | ICD-10-CM | POA: Diagnosis present

## 2021-05-16 DIAGNOSIS — F200 Paranoid schizophrenia: Principal | ICD-10-CM | POA: Diagnosis present

## 2021-05-16 DIAGNOSIS — F32A Depression, unspecified: Secondary | ICD-10-CM | POA: Diagnosis not present

## 2021-05-16 DIAGNOSIS — R400 Somnolence: Secondary | ICD-10-CM | POA: Diagnosis not present

## 2021-05-16 DIAGNOSIS — F209 Schizophrenia, unspecified: Secondary | ICD-10-CM

## 2021-05-16 DIAGNOSIS — Z6841 Body Mass Index (BMI) 40.0 and over, adult: Secondary | ICD-10-CM | POA: Diagnosis not present

## 2021-05-16 DIAGNOSIS — R451 Restlessness and agitation: Secondary | ICD-10-CM

## 2021-05-16 DIAGNOSIS — R0602 Shortness of breath: Secondary | ICD-10-CM

## 2021-05-16 DIAGNOSIS — F203 Undifferentiated schizophrenia: Secondary | ICD-10-CM | POA: Diagnosis not present

## 2021-05-16 DIAGNOSIS — K219 Gastro-esophageal reflux disease without esophagitis: Secondary | ICD-10-CM | POA: Diagnosis present

## 2021-05-16 DIAGNOSIS — Y900 Blood alcohol level of less than 20 mg/100 ml: Secondary | ICD-10-CM | POA: Insufficient documentation

## 2021-05-16 DIAGNOSIS — Z79899 Other long term (current) drug therapy: Secondary | ICD-10-CM | POA: Insufficient documentation

## 2021-05-16 DIAGNOSIS — J45909 Unspecified asthma, uncomplicated: Secondary | ICD-10-CM

## 2021-05-16 LAB — COMPREHENSIVE METABOLIC PANEL
ALT: 19 U/L (ref 0–44)
AST: 29 U/L (ref 15–41)
Albumin: 3.6 g/dL (ref 3.5–5.0)
Alkaline Phosphatase: 114 U/L (ref 38–126)
Anion gap: 13 (ref 5–15)
BUN: 22 mg/dL — ABNORMAL HIGH (ref 6–20)
CO2: 27 mmol/L (ref 22–32)
Calcium: 9.1 mg/dL (ref 8.9–10.3)
Chloride: 96 mmol/L — ABNORMAL LOW (ref 98–111)
Creatinine, Ser: 1.63 mg/dL — ABNORMAL HIGH (ref 0.61–1.24)
GFR, Estimated: 48 mL/min — ABNORMAL LOW (ref >60)
Glucose, Bld: 226 mg/dL — ABNORMAL HIGH (ref 70–99)
Potassium: 4.1 mmol/L (ref 3.5–5.1)
Sodium: 136 mmol/L (ref 135–145)
Total Bilirubin: 0.5 mg/dL (ref 0.3–1.2)
Total Protein: 7.9 g/dL (ref 6.5–8.1)

## 2021-05-16 LAB — SALICYLATE LEVEL: Salicylate Lvl: <7 mg/dL — ABNORMAL LOW (ref 7.0–30.0)

## 2021-05-16 LAB — LIPID PANEL
Cholesterol: 194 mg/dL (ref 0–200)
HDL: 80 mg/dL (ref >40)
LDL Cholesterol: 77 mg/dL (ref 0–99)
Total CHOL/HDL Ratio: 2.4 RATIO
Triglycerides: 183 mg/dL — ABNORMAL HIGH (ref <150)
VLDL: 37 mg/dL (ref 0–40)

## 2021-05-16 LAB — URINE DRUG SCREEN, QUALITATIVE (ARMC ONLY)
Amphetamines, Ur Screen: NOT DETECTED
Barbiturates, Ur Screen: NOT DETECTED
Benzodiazepine, Ur Scrn: NOT DETECTED
Cannabinoid 50 Ng, Ur ~~LOC~~: NOT DETECTED
Cocaine Metabolite,Ur ~~LOC~~: NOT DETECTED
MDMA (Ecstasy)Ur Screen: NOT DETECTED
Methadone Scn, Ur: NOT DETECTED
Opiate, Ur Screen: POSITIVE — AB
Phencyclidine (PCP) Ur S: NOT DETECTED
Tricyclic, Ur Screen: NOT DETECTED

## 2021-05-16 LAB — CBC
HCT: 36.5 % — ABNORMAL LOW (ref 39.0–52.0)
Hemoglobin: 11.9 g/dL — ABNORMAL LOW (ref 13.0–17.0)
MCH: 29 pg (ref 26.0–34.0)
MCHC: 32.6 g/dL (ref 30.0–36.0)
MCV: 89 fL (ref 80.0–100.0)
Platelets: 291 10*3/uL (ref 150–400)
RBC: 4.1 MIL/uL — ABNORMAL LOW (ref 4.22–5.81)
RDW: 14.7 % (ref 11.5–15.5)
WBC: 8.6 10*3/uL (ref 4.0–10.5)
nRBC: 0 % (ref 0.0–0.2)

## 2021-05-16 LAB — ETHANOL: Alcohol, Ethyl (B): <10 mg/dL (ref <10)

## 2021-05-16 LAB — RESP PANEL BY RT-PCR (FLU A&B, COVID) ARPGX2
Influenza A by PCR: NEGATIVE
Influenza B by PCR: NEGATIVE
SARS Coronavirus 2 by RT PCR: NEGATIVE

## 2021-05-16 LAB — ACETAMINOPHEN LEVEL: Acetaminophen (Tylenol), Serum: <10 ug/mL — ABNORMAL LOW (ref 10–30)

## 2021-05-16 LAB — HEMOGLOBIN A1C
Hgb A1c MFr Bld: 10.9 % — ABNORMAL HIGH (ref 4.8–5.6)
Mean Plasma Glucose: 266 mg/dL

## 2021-05-16 MED ORDER — DROPERIDOL 2.5 MG/ML IJ SOLN
5.0000 mg | Freq: Once | INTRAMUSCULAR | Status: AC
  Administered 2021-05-16: 5 mg via INTRAMUSCULAR

## 2021-05-16 MED ORDER — PRAVASTATIN SODIUM 20 MG PO TABS: 20.0000 mg | ORAL_TABLET | Freq: Every day | ORAL | Status: DC

## 2021-05-16 MED ORDER — PANTOPRAZOLE SODIUM 40 MG PO TBEC
40.0000 mg | DELAYED_RELEASE_TABLET | Freq: Every day | ORAL | Status: DC
  Administered 2021-05-16: 40 mg via ORAL
  Filled 2021-05-16: qty 1

## 2021-05-16 MED ORDER — ASPIRIN EC 81 MG PO TBEC
81.0000 mg | DELAYED_RELEASE_TABLET | Freq: Every day | ORAL | Status: DC
  Administered 2021-05-16: 81 mg via ORAL
  Filled 2021-05-16: qty 1

## 2021-05-16 MED ORDER — ALUM & MAG HYDROXIDE-SIMETH 200-200-20 MG/5ML PO SUSP: 30.0000 mL | ORAL | Status: DC | PRN

## 2021-05-16 MED ORDER — GABAPENTIN 100 MG PO CAPS
100.0000 mg | ORAL_CAPSULE | Freq: Two times a day (BID) | ORAL | Status: DC
  Administered 2021-05-16 – 2021-05-18 (×5): 100 mg via ORAL
  Filled 2021-05-16 (×6): qty 1

## 2021-05-16 MED ORDER — MAGNESIUM HYDROXIDE 400 MG/5ML PO SUSP: 30.0000 mL | Freq: Every day | ORAL | Status: DC | PRN

## 2021-05-16 MED ORDER — INSULIN GLARGINE-YFGN 100 UNIT/ML ~~LOC~~ SOLN
20.0000 [IU] | Freq: Every day | SUBCUTANEOUS | Status: DC
  Filled 2021-05-16: qty 0.2

## 2021-05-16 MED ORDER — HYDROXYZINE HCL 50 MG PO TABS: 50.0000 mg | ORAL_TABLET | Freq: Four times a day (QID) | ORAL | Status: DC | PRN

## 2021-05-16 MED ORDER — AMLODIPINE BESYLATE 5 MG PO TABS
5.0000 mg | ORAL_TABLET | Freq: Every day | ORAL | Status: DC
  Administered 2021-05-16: 5 mg via ORAL
  Filled 2021-05-16: qty 1

## 2021-05-16 MED ORDER — AMLODIPINE BESYLATE 5 MG PO TABS
5.0000 mg | ORAL_TABLET | Freq: Every day | ORAL | Status: DC
  Administered 2021-05-17 – 2021-05-23 (×6): 5 mg via ORAL
  Filled 2021-05-16 (×6): qty 1

## 2021-05-16 MED ORDER — METFORMIN HCL 500 MG PO TABS
1000.0000 mg | ORAL_TABLET | Freq: Two times a day (BID) | ORAL | Status: DC
  Administered 2021-05-17 – 2021-05-18 (×4): 1000 mg via ORAL
  Filled 2021-05-16 (×5): qty 2

## 2021-05-16 MED ORDER — ASPIRIN EC 81 MG PO TBEC
81.0000 mg | DELAYED_RELEASE_TABLET | Freq: Every day | ORAL | Status: DC
  Administered 2021-05-17 – 2021-05-23 (×6): 81 mg via ORAL
  Filled 2021-05-16 (×6): qty 1

## 2021-05-16 MED ORDER — INSULIN GLARGINE-YFGN 100 UNIT/ML ~~LOC~~ SOLN
20.0000 [IU] | Freq: Every day | SUBCUTANEOUS | Status: DC
  Filled 2021-05-16 (×2): qty 0.2

## 2021-05-16 MED ORDER — PALIPERIDONE ER 3 MG PO TB24
3.0000 mg | ORAL_TABLET | Freq: Every day | ORAL | Status: DC
  Filled 2021-05-16: qty 1

## 2021-05-16 MED ORDER — ALPRAZOLAM 0.5 MG PO TABS: 1.0000 mg | ORAL_TABLET | Freq: Three times a day (TID) | ORAL | Status: DC | PRN

## 2021-05-16 MED ORDER — PRAMIPEXOLE DIHYDROCHLORIDE 0.25 MG PO TABS
0.2500 mg | ORAL_TABLET | Freq: Every day | ORAL | Status: DC
  Administered 2021-05-16 – 2021-05-22 (×7): 0.25 mg via ORAL
  Filled 2021-05-16 (×9): qty 1

## 2021-05-16 MED ORDER — HYDROCODONE-ACETAMINOPHEN 10-325 MG PO TABS: 1.0000 | ORAL_TABLET | Freq: Four times a day (QID) | ORAL | Status: DC | PRN

## 2021-05-16 MED ORDER — PALIPERIDONE ER 3 MG PO TB24
3.0000 mg | ORAL_TABLET | Freq: Every day | ORAL | Status: DC
  Administered 2021-05-16 – 2021-05-22 (×7): 3 mg via ORAL
  Filled 2021-05-16 (×8): qty 1

## 2021-05-16 MED ORDER — ALBUTEROL SULFATE (2.5 MG/3ML) 0.083% IN NEBU
2.5000 mg | INHALATION_SOLUTION | Freq: Four times a day (QID) | RESPIRATORY_TRACT | Status: DC | PRN
  Administered 2021-05-18 – 2021-05-19 (×2): 2.5 mg via RESPIRATORY_TRACT
  Filled 2021-05-16 (×6): qty 3

## 2021-05-16 MED ORDER — PRAMIPEXOLE DIHYDROCHLORIDE 0.25 MG PO TABS
0.2500 mg | ORAL_TABLET | Freq: Every day | ORAL | Status: DC
  Filled 2021-05-16: qty 1

## 2021-05-16 MED ORDER — PANTOPRAZOLE SODIUM 40 MG PO TBEC
40.0000 mg | DELAYED_RELEASE_TABLET | Freq: Every day | ORAL | Status: DC
  Administered 2021-05-17 – 2021-05-23 (×6): 40 mg via ORAL
  Filled 2021-05-16 (×6): qty 1

## 2021-05-16 MED ORDER — METFORMIN HCL 500 MG PO TABS
1000.0000 mg | ORAL_TABLET | Freq: Two times a day (BID) | ORAL | Status: DC
  Filled 2021-05-16: qty 2

## 2021-05-16 MED ORDER — ALBUTEROL SULFATE (2.5 MG/3ML) 0.083% IN NEBU
2.5000 mg | INHALATION_SOLUTION | Freq: Four times a day (QID) | RESPIRATORY_TRACT | Status: DC | PRN
Start: 2021-05-16 — End: 2021-05-16

## 2021-05-16 MED ORDER — HYDROCODONE-ACETAMINOPHEN 10-325 MG PO TABS
1.0000 | ORAL_TABLET | Freq: Four times a day (QID) | ORAL | Status: DC | PRN
Start: 2021-05-16 — End: 2021-05-16
  Administered 2021-05-16: 1 via ORAL
  Filled 2021-05-16: qty 1

## 2021-05-16 MED ORDER — ACETAMINOPHEN 325 MG PO TABS: 650.0000 mg | ORAL_TABLET | Freq: Four times a day (QID) | ORAL | Status: DC | PRN

## 2021-05-16 MED ORDER — ALPRAZOLAM 0.5 MG PO TABS: 1.0000 mg | ORAL_TABLET | Freq: Three times a day (TID) | ORAL | Status: DC

## 2021-05-16 MED ORDER — GABAPENTIN 100 MG PO CAPS
100.0000 mg | ORAL_CAPSULE | Freq: Two times a day (BID) | ORAL | Status: DC
  Administered 2021-05-16: 100 mg via ORAL
  Filled 2021-05-16: qty 1

## 2021-05-16 MED ORDER — PRAVASTATIN SODIUM 20 MG PO TABS
20.0000 mg | ORAL_TABLET | Freq: Every day | ORAL | Status: DC
  Administered 2021-05-16 – 2021-05-22 (×7): 20 mg via ORAL
  Filled 2021-05-16 (×9): qty 1

## 2021-05-16 NOTE — BH Assessment (Signed)
Psychiatry brief note: TTS counselor Ms. Faulcon and this Clinical research associate came to see the patient in the emergency room to be assessed, but he refused to answer any questions asked. He stated, "get out now; I want you to leave," and he would stare without words.

## 2021-05-16 NOTE — ED Notes (Signed)
Pt standing in room naked. Continues to attempt to try and take clothes off overnight per report.

## 2021-05-16 NOTE — ED Provider Notes (Signed)
[facility] Emergency Department Provider Note  ____________________________________________   Event Date/Time   First MD Initiated Contact with Patient 05/16/21 0215     (approximate)  I have reviewed the triage vital signs and the nursing notes.   HISTORY  Chief Complaint Psychiatric Evaluation  Level 5 caveat:  history/ROS limited by active psychosis / mental illness / altered mental status and/or his chronic intellectual disability.   HPI Larry Cook is a 60 y.o. male with medical and psychiatric history as listed below who presents under involuntary commitment.  He was placed under IVC for gradually worsening symptoms over the last week or so which include not caring for himself, likely not taking his medications, increasingly erratic behavior, culminating in threats of violence towards other people (allegedly he is stating that he is going to rape and kill people).  He is not able to be controlled or redirected where he is living.  The patient will not speak with [place] and will minimally follow instructions.  He is not willing or able to provide any history.     Past Medical History:  Diagnosis Date   Diabetes mellitus without complication (HCC)    Hypertension    Mentally disabled    Schizophrenia (HCC)     There are no problems to display for this patient.   No past surgical history on file.  Prior to Admission medications   Medication Sig Start Date End Date Taking? Authorizing Provider  albuterol (PROVENTIL) (2.5 MG/3ML) 0.083% nebulizer solution Inhale 3 mLs into the lungs every 4 (four) hours as needed for wheezing. 12/12/16   [provider]  ALPRAZolam Prudy Feeler) 1 MG tablet Take 1 mg by mouth 3 (three) times daily as needed for sleep. 12/23/16   [provider]  amLODipine (NORVASC) 5 MG tablet Take 5 mg by mouth daily. 01/30/17   [provider]  aspirin EC 81 MG tablet Take 81 mg by mouth daily.    [provider]  gabapentin (NEURONTIN) 100 MG capsule Take 100 mg by mouth 2 (two) times daily. 12/12/16   [provider]  HYDROcodone-acetaminophen (NORCO) 10-325 MG tablet Take 1 tablet by mouth every 6 (six) hours as needed for pain. 01/08/17   [provider]  insulin glargine (LANTUS) 100 UNIT/ML injection Inject 20 Units into the skin at bedtime.    [provider]  lovastatin (MEVACOR) 40 MG tablet Take 40 mg by mouth daily with supper. 01/03/17   [provider]  meloxicam (MOBIC) 7.5 MG tablet Take 7.5 mg by mouth at bedtime. 01/30/17   [provider]  metFORMIN (GLUCOPHAGE) 1000 MG tablet Take 1,000 mg by mouth 2 (two) times daily. 01/03/17   [provider]  Olopatadine HCl 0.2 % SOLN Place 1 drop into both eyes daily. 01/28/17   [provider]  omeprazole (PRILOSEC) 20 MG capsule Take 20 mg by mouth 2 (two) times daily. 01/30/17   [provider]  pramipexole (MIRAPEX) 0.25 MG tablet Take 0.25 mg by mouth at bedtime. 05/06/15   [provider]    Allergies Patient has no known allergies.  No family history on file.  Social History Social History   Tobacco Use   Smoking status: Never   Smokeless tobacco: Never  Substance Use Topics   Alcohol use: No   Drug use: No    Review of Systems Level 5 caveat:  history/ROS limited by active psychosis / mental illness / altered mental status and/or his  chronic intellectual disability.   ____________________________________________   PHYSICAL EXAM:  VITAL SIGNS: ED Triage Vitals  Enc Vitals Group     BP 05/16/21 0147 (!) 142/94     Pulse Rate 05/16/21 0147 (!) 105     Resp 05/16/21 0147 20     Temp 05/16/21 0147 98 F (36.7 C)     Temp Source 05/16/21 0147 Axillary     SpO2 05/16/21 0147 95 %     Weight 05/16/21 0148 95.3 kg (210 lb)     Height 05/16/21 0148 1.219 m (4')     Head Circumference --      Peak Flow --      Pain Score 05/16/21  0148 0     Pain Loc --      Pain Edu? --      Excl. in GC? --     Constitutional: Awake and agitated. Eyes: Conjunctivae are normal.  Head: Atraumatic. Nose: No congestion/rhinnorhea. Mouth/Throat: Patient is wearing a mask. Neck: No stridor.  No meningeal signs.   Cardiovascular: Tachycardia, regular rhythm. Good peripheral circulation. Respiratory: Increased respiratory rate, but he did allow me to auscultate and he has clear breath sounds throughout with no wheezing, rales, nor rhonchi. Gastrointestinal: Nondistended, no indication of abdominal pain. Musculoskeletal: Small stature but obese for height.  No obvious injuries or nor gross deformities. Neurologic: Ambulating rapidly and without difficulty.  No obvious coordination difficulties.  Unable to participate in neurological exam. Skin:  Skin is warm, dry and intact. Psychiatric: Mood and affect are anxious and agitated.  ____________________________________________   LABS (all labs ordered are listed, but only abnormal results are displayed)  Labs Reviewed  COMPREHENSIVE METABOLIC PANEL - Abnormal; Notable for the following components:      Result Value   Chloride 96 (*)    Glucose, Bld 226 (*)    BUN 22 (*)    Creatinine, Ser 1.63 (*)    GFR, Estimated 48 (*)    All other components within normal limits  CBC - Abnormal; Notable for the following components:   RBC 4.10 (*)    Hemoglobin 11.9 (*)    HCT 36.5 (*)    All other components within normal limits  SALICYLATE LEVEL - Abnormal; Notable for the following components:   Salicylate Lvl <7.0 (*)    All other components within normal limits  ACETAMINOPHEN LEVEL - Abnormal; Notable for the following components:   Acetaminophen (Tylenol), Serum <10 (*)    All other components within normal limits  RESP PANEL BY RT-PCR (FLU A&B, COVID) ARPGX2  ETHANOL  URINE DRUG SCREEN, QUALITATIVE ([facility] ONLY)   ____________________________________________   INITIAL  IMPRESSION / MDM / ASSESSMENT AND PLAN / ED COURSE  As part of my medical decision making, I reviewed the following data within the electronic MEDICAL RECORD NUMBER Nursing notes reviewed and incorporated, Labs reviewed , Old chart reviewed, A consult was requested and obtained from this/these consultant(s) Psychiatry, and Notes from prior ED visits   Differential diagnosis includes, but is not limited to, schizophrenia, medication noncompliance, medication or drug side effect, acute infection.  Patient is quite agitated and having difficulty following commands.  He keeps stomping around his room and taking off his pants and underwear for no clear reason.  Although he was reportedly verbal and making threats of violence prior to arrival, he will not speak with [place].  He is mildly tachycardic but I suspect that is due to agitation.  He has no signs or symptoms  in his history nor physical exam that he will allow of acute infection.  His lungs are clear to auscultation.  I am giving him a dose of droperidol 5 mg intramuscular as a calming agent.  He will need psychiatric assessment.  COVID test is negative, ethanol negative, acetaminophen and salicylate levels are negative, CBC is normal, comprehensive metabolic panel is normal other than a slight bump in his creatinine and we will encourage p.o. fluids.  Psych consult pending. The patient has been placed in psychiatric observation due to the need to provide a safe environment for the patient while obtaining psychiatric consultation and evaluation, as well as ongoing medical and medication management to treat the patient's condition.  The patient has been placed under full IVC at this time.            ____________________________________________  FINAL CLINICAL IMPRESSION(S) / ED DIAGNOSES  Final diagnoses:  Agitation  Aggressive behavior     MEDICATIONS GIVEN DURING THIS VISIT:  Medications  droperidol (INAPSINE) 2.5 MG/ML injection 5 mg (5  mg Intramuscular Given 05/16/21 0316)     ED Discharge Orders     None        Note:  This document was prepared using Dragon voice recognition software and may include unintentional dictation errors.   Loleta Rose, MD 05/16/21 0800

## 2021-05-16 NOTE — ED Notes (Signed)
IVC  CONSULT  DONE  PENDING  GOING TO  [facility]  TONIGHT °

## 2021-05-16 NOTE — ED Notes (Signed)
Unable to assess patient due to patient not communicating with Clinical research associate.

## 2021-05-16 NOTE — Tx Team (Signed)
Initial Treatment Plan 05/16/2021 10:54 PM Tierre Netto DXA:[id number]    PATIENT STRESSORS: Marital or family conflict   Medication change or noncompliance     PATIENT STRENGTHS: Physical Health  Supportive family/friends    PATIENT IDENTIFIED PROBLEMS: Medication noncompliance                      DISCHARGE CRITERIA:  Motivation to continue treatment in a less acute level of care Verbal commitment to aftercare and medication compliance  PRELIMINARY DISCHARGE PLAN: Outpatient therapy Return to previous living arrangement  PATIENT/FAMILY INVOLVEMENT: This treatment plan has been presented to and reviewed with the patient, Larry Cook. The patient has been given the opportunity to ask questions and make suggestions.  Elmyra Ricks, RN 05/16/2021, 10:54 PM

## 2021-05-16 NOTE — ED Notes (Signed)
Resumed care from [facility].  Pt sleeping.

## 2021-05-16 NOTE — ED Notes (Signed)
Lying on the floor, naked. Declines needs.

## 2021-05-16 NOTE — ED Notes (Signed)
Gave patient food tray with drink. Patient is sleeping at this time.

## 2021-05-16 NOTE — Plan of Care (Signed)
Patient new to the unit tonight, hasn't had time to progress  Problem: Education: Goal: Knowledge of [place] [facility] Education information/materials will improve Outcome: Not Progressing Goal: Emotional status will improve Outcome: Not Progressing Goal: Mental status will improve Outcome: Not Progressing Goal: Verbalization of understanding the information provided will improve Outcome: Not Progressing   Problem: Safety: Goal: Periods of time without injury will increase Outcome: Not Progressing   Problem: Education: Goal: Will be free of psychotic symptoms Outcome: Not Progressing Goal: Knowledge of the prescribed therapeutic regimen will improve Outcome: Not Progressing   Problem: Safety: Goal: Ability to redirect hostility and anger into socially appropriate behaviors will improve Outcome: Not Progressing Goal: Ability to remain free from injury will improve Outcome: Not Progressing   

## 2021-05-16 NOTE — Progress Notes (Signed)
Patient admitted from [facility] - ED, report received from Amy, RN. Patient calm but refused to answer any questions with this RN or the other RN working with me tonight. Patient also refused to let us do a skin check. Patient oriented to his room and the unit. Patient did take his scheduled medications except for his inulin shot, pt refused but was given education and still refused. Patient being monitored Q 15 minutes for safety per unit protocol. Pt remains safe on the unit.

## 2021-05-16 NOTE — ED Triage Notes (Signed)
Pt brought in by [employer].  Pt is IVC from home.  Pt not talking in triage.   Hx schizo.  Per ivc papers pt may not be taking medications, agitated.

## 2021-05-16 NOTE — Consult Note (Signed)
[facility] Face-to-Face Psychiatry Consult   Reason for Consult: Consult for this 60 year old man with a history of mental illness brought to the emergency room by his sister Referring Physician: Synechiae Patient Identification: Larry Cook MRN:  [id number] Principal Diagnosis: Schizophrenia [facility]) Diagnosis:  Principal Problem:   Schizophrenia (HCC) Active Problems:   Diabetes (HCC)   Asthma   Hypertension   Dyslipidemia   Total Time spent with patient: 1 hour  Subjective:   Larry Cook is a 60 y.o. male patient admitted with patient is currently nonverbal.  HPI: Patient seen chart reviewed.  Also spoke with his sister by phone.  2 year old man with a history of chronic mental health problems.  Sister reports that for the past 2 weeks his behavior has changed.  He has become more aggressive at home.  He swung a chair at her in the kitchen.  He has been walking past younger members of the household making inappropriate sexual type comments.  He appears to be more disorganized and is not taking care of himself refusing to bathe and resisting normal treatment.  Sister reports there has been no change in any of his prescribed medications and no known change in his stress level.  Denies that he uses alcohol or drugs.  Patient does not currently see a psychiatrist.  Right now it looks like his only mental health treatment is to continue to receive Xanax from Dr. Judithann Sheen.  He also is on chronic pain medication from Dr. Judithann Sheen.  Patient himself provides no information in the interview.  He made little eye contact and seemed to be whispering to himself.  Sister reports this is not his baseline  Past Psychiatric History: Somewhat unclear although if we look back in the chart he has a prior diagnosis of schizophrenia.  Sister says that many years ago he had been in hospitals but cannot remember when.  It looks like in the past he used to receive Risperdal Consta 50 mg every 2 weeks but at some point that  was discontinued.  Sister says he just does not get it anymore there is no particular reason why.  Only psychiatric medicine right now is Xanax 1 mg 3 times a day provided by Dr. Judithann Sheen.  Risk to Self:   Risk to Others:   Prior Inpatient Therapy:   Prior Outpatient Therapy:    Past Medical History:  Past Medical History:  Diagnosis Date   Diabetes mellitus without complication (HCC)    Hypertension    Mentally disabled    Schizophrenia (HCC)    No past surgical history on file. Family History: No family history on file. Family Psychiatric  History: None reported Social History:  Social History   Substance and Sexual Activity  Alcohol Use No     Social History   Substance and Sexual Activity  Drug Use No    Social History   Socioeconomic History   Marital status: Single    Spouse name: Not on file   Number of children: Not on file   Years of education: Not on file   Highest education level: Not on file  Occupational History   Not on file  Tobacco Use   Smoking status: Never   Smokeless tobacco: Never  Substance and Sexual Activity   Alcohol use: No   Drug use: No   Sexual activity: Not on file  Other Topics Concern   Not on file  Social History Narrative   Not on file   Social Determinants of Health  Financial Resource Strain: Not on file  Food Insecurity: Not on file  Transportation Needs: Not on file  Physical Activity: Not on file  Stress: Not on file  Social Connections: Not on file   Additional Social History:    Allergies:  No Known Allergies  Labs:  Results for orders placed or performed during the hospital encounter of 05/16/21 (from the past 48 hour(s))  Comprehensive metabolic panel     Status: Abnormal   Collection Time: 05/16/21  1:51 AM  Result Value Ref Range   Sodium 136 135 - 145 mmol/L   Potassium 4.1 3.5 - 5.1 mmol/L   Chloride 96 (L) 98 - 111 mmol/L   CO2 27 22 - 32 mmol/L   Glucose, Bld 226 (H) 70 - 99 mg/dL    Comment:  Glucose reference range applies only to samples taken after fasting for at least 8 hours.   BUN 22 (H) 6 - 20 mg/dL   Creatinine, Ser 8.78 (H) 0.61 - 1.24 mg/dL   Calcium 9.1 8.9 - 67.6 mg/dL   Total Protein 7.9 6.5 - 8.1 g/dL   Albumin 3.6 3.5 - 5.0 g/dL   AST 29 15 - 41 U/L   ALT 19 0 - 44 U/L   Alkaline Phosphatase 114 38 - 126 U/L   Total Bilirubin 0.5 0.3 - 1.2 mg/dL   GFR, Estimated 48 (L) >60 mL/min    Comment: (NOTE) Calculated using the CKD-EPI Creatinine Equation (2021)    Anion gap 13 5 - 15    Comment: Performed at [facility], [address] [place], [place] [address]  Ethanol     Status: None   Collection Time: 05/16/21  1:51 AM  Result Value Ref Range   Alcohol, Ethyl (B) <10 <10 mg/dL    Comment: (NOTE) Lowest detectable limit for serum alcohol is 10 mg/dL.  For medical purposes only. Performed at [facility], [address] [place], [place] [address]   cbc     Status: Abnormal   Collection Time: 05/16/21  1:51 AM  Result Value Ref Range   WBC 8.6 4.0 - 10.5 K/uL   RBC 4.10 (L) 4.22 - 5.81 MIL/uL   Hemoglobin 11.9 (L) 13.0 - 17.0 g/dL   HCT 83.6 (L) 62.9 - 47.6 %   MCV 89.0 80.0 - 100.0 fL   MCH 29.0 26.0 - 34.0 pg   MCHC 32.6 30.0 - 36.0 g/dL   RDW 54.6 50.3 - 54.6 %   Platelets 291 150 - 400 K/uL   nRBC 0.0 0.0 - 0.2 %    Comment: Performed at [facility], [address] [place], [place] [address]  Salicylate level     Status: Abnormal   Collection Time: 05/16/21  1:51 AM  Result Value Ref Range   Salicylate Lvl <7.0 (L) 7.0 - 30.0 mg/dL    Comment: Performed at [facility], [address] [place], [place] [address]  Acetaminophen level     Status: Abnormal   Collection Time: 05/16/21  1:51 AM  Result Value Ref Range   Acetaminophen (Tylenol), Serum <10 (L) 10 - 30 ug/mL    Comment: (NOTE) Therapeutic concentrations vary significantly. A range of 10-30 ug/mL  may be an effective concentration for  many patients. However, some  are best treated at concentrations outside of this range. Acetaminophen concentrations >150 ug/mL at 4 hours after ingestion  and >50 ug/mL at 12 hours after ingestion are often associated with  toxic reactions.  Performed at [facility], [address] [place], [place] [address]   Resp Panel by RT-PCR (Flu A&B, Covid) Nasopharyngeal Swab     Status: None   Collection Time: 05/16/21  6:12 AM   Specimen: Nasopharyngeal Swab; Nasopharyngeal(NP) swabs in vial transport medium  Result Value Ref Range   SARS Coronavirus 2 by RT PCR NEGATIVE NEGATIVE    Comment: (NOTE) SARS-CoV-2 target nucleic acids are NOT DETECTED.  The SARS-CoV-2 RNA is generally detectable in upper respiratory specimens during the acute phase of infection. The lowest concentration of SARS-CoV-2 viral copies this assay can detect is 138 copies/mL. A negative result does not preclude SARS-Cov-2 infection and should not be used as the sole basis for treatment or other patient management decisions. A negative result may occur with  improper specimen collection/handling, submission of specimen other than nasopharyngeal swab, presence of viral mutation(s) within the areas targeted by this assay, and inadequate number of viral copies(<138 copies/mL). A negative result must be combined with clinical observations, patient history, and epidemiological information. The expected result is Negative.  Fact Sheet for Patients:  BloggerCourse.com  Fact Sheet for Healthcare Providers:  SeriousBroker.it  This test is no t yet approved or cleared by the [place] FDA and  has been authorized for detection and/or diagnosis of SARS-CoV-2 by FDA under an Emergency Use Authorization (EUA). This EUA will remain  in effect (meaning this test can be used) for the duration of the COVID-19 declaration under Section 564(b)(1) of the Act,  21 U.S.C.section 360bbb-3(b)(1), unless the authorization is terminated  or revoked sooner.       Influenza A by PCR NEGATIVE NEGATIVE   Influenza B by PCR NEGATIVE NEGATIVE    Comment: (NOTE) The Xpert Xpress SARS-CoV-2/FLU/RSV plus assay is intended as an aid in the diagnosis of influenza from Nasopharyngeal swab specimens and should not be used as a sole basis for treatment. Nasal washings and aspirates are unacceptable for Xpert Xpress SARS-CoV-2/FLU/RSV testing.  Fact Sheet for Patients: BloggerCourse.com  Fact Sheet for Healthcare Providers: SeriousBroker.it  This test is not yet approved or cleared by the [place] FDA and has been authorized for detection and/or diagnosis of SARS-CoV-2 by FDA under an Emergency Use Authorization (EUA). This EUA will remain in effect (meaning this test can be used) for the duration of the COVID-19 declaration under Section 564(b)(1) of the Act, 21 U.S.C. section 360bbb-3(b)(1), unless the authorization is terminated or revoked.  Performed at [facility], [address] [place], [place] [address]     Current Facility-Administered Medications  Medication Dose Route Frequency Provider Last Rate Last Admin   albuterol (VENTOLIN HFA) 108 (90 Base) MCG/ACT inhaler 2 puff  2 puff Inhalation Q6H PRN Ashwini Jago, Jackquline Denmark, MD       ALPRAZolam Prudy Feeler) tablet 1 mg  1 mg Oral TID Yaretzi Ernandez T, MD       amLODipine (NORVASC) tablet 5 mg  5 mg Oral Daily Audris Speaker T, MD       aspirin EC tablet 81 mg  81 mg Oral Daily Jayla Mackie T, MD       gabapentin (NEURONTIN) capsule 100 mg  100 mg Oral BID Alvin Diffee T, MD       HYDROcodone-acetaminophen (NORCO) 10-325 MG per tablet 1 tablet  1 tablet Oral Q6H Kevonte Vanecek T, MD       insulin glargine-yfgn (SEMGLEE) injection 20 Units  20 Units Subcutaneous QHS Cecillia Menees, Jackquline Denmark, MD  metFORMIN (GLUCOPHAGE) tablet 1,000 mg  1,000 mg Oral  BID WC Nancy Arvin T, MD       paliperidone (INVEGA) 24 hr tablet 3 mg  3 mg Oral QHS Nickolas Chalfin T, MD       pantoprazole (PROTONIX) EC tablet 40 mg  40 mg Oral Daily Laressa Bolinger T, MD       pramipexole (MIRAPEX) tablet 0.25 mg  0.25 mg Oral QHS Sriyan Cutting T, MD       pravastatin (PRAVACHOL) tablet 20 mg  20 mg Oral q1800 Kynzee Devinney, Jackquline Denmark, MD       Current Outpatient Medications  Medication Sig Dispense Refill   albuterol (PROVENTIL) (2.5 MG/3ML) 0.083% nebulizer solution Inhale 3 mLs into the lungs every 4 (four) hours as needed for wheezing.  1   ALPRAZolam (XANAX) 1 MG tablet Take 1 mg by mouth 3 (three) times daily as needed for sleep.  1   amLODipine (NORVASC) 5 MG tablet Take 5 mg by mouth daily.  2   aspirin EC 81 MG tablet Take 81 mg by mouth daily.     gabapentin (NEURONTIN) 100 MG capsule Take 100 mg by mouth 2 (two) times daily.  3   HYDROcodone-acetaminophen (NORCO) 10-325 MG tablet Take 1 tablet by mouth every 6 (six) hours as needed for pain.  0   insulin glargine (LANTUS) 100 UNIT/ML injection Inject 20 Units into the skin at bedtime.     lovastatin (MEVACOR) 40 MG tablet Take 40 mg by mouth daily with supper.  3   meloxicam (MOBIC) 7.5 MG tablet Take 7.5 mg by mouth at bedtime.  3   metFORMIN (GLUCOPHAGE) 1000 MG tablet Take 1,000 mg by mouth 2 (two) times daily.  3   Olopatadine HCl 0.2 % SOLN Place 1 drop into both eyes daily.  5   omeprazole (PRILOSEC) 20 MG capsule Take 20 mg by mouth 2 (two) times daily.  3   pramipexole (MIRAPEX) 0.25 MG tablet Take 0.25 mg by mouth at bedtime.      Musculoskeletal: Strength & Muscle Tone: within normal limits Gait & Station: normal Patient leans: N/A            Psychiatric Specialty Exam:  Presentation  General Appearance:  No data recorded Eye Contact: No data recorded Speech: No data recorded Speech Volume: No data recorded Handedness: No data recorded  Mood and Affect  Mood: No data  recorded Affect: No data recorded  Thought Process  Thought Processes: No data recorded Descriptions of Associations:No data recorded Orientation:No data recorded Thought Content:No data recorded History of Schizophrenia/Schizoaffective disorder:No data recorded Duration of Psychotic Symptoms:No data recorded Hallucinations:No data recorded Ideas of Reference:No data recorded Suicidal Thoughts:No data recorded Homicidal Thoughts:No data recorded  Sensorium  Memory: No data recorded Judgment: No data recorded Insight: No data recorded  Executive Functions  Concentration: No data recorded Attention Span: No data recorded Recall: No data recorded Fund of Knowledge: No data recorded Language: No data recorded  Psychomotor Activity  Psychomotor Activity: No data recorded  Assets  Assets: No data recorded  Sleep  Sleep: No data recorded  Physical Exam: Physical Exam Vitals and nursing note reviewed.  Constitutional:      Appearance: Normal appearance.  HENT:     Head: Normocephalic and atraumatic.     Mouth/Throat:     Pharynx: Oropharynx is clear.  Eyes:     Pupils: Pupils are equal, round, and reactive to light.  Cardiovascular:  Rate and Rhythm: Normal rate and regular rhythm.  Pulmonary:     Effort: Pulmonary effort is normal.     Breath sounds: Normal breath sounds.  Abdominal:     General: Abdomen is flat.     Palpations: Abdomen is soft.  Musculoskeletal:        General: Normal range of motion.  Skin:    General: Skin is warm and dry.  Neurological:     General: No focal deficit present.     Mental Status: He is alert. Mental status is at baseline.  Psychiatric:        Attention and Perception: He is inattentive.        Mood and Affect: Affect is blunt and inappropriate.        Speech: He is noncommunicative.        Behavior: Behavior is slowed.        Cognition and Memory: Cognition is impaired.        Judgment: Judgment is  impulsive.   Review of Systems  Unable to perform ROS: Psychiatric disorder  Blood pressure (!) 164/88, pulse (!) 107, temperature 98 F (36.7 C), temperature source Axillary, resp. rate 16, height 4' (1.219 m), weight 95.3 kg, SpO2 96 %. Body mass index is 64.08 kg/m.  Treatment Plan Summary: Medication management and Plan 60 year old man who appears to have chronic mental health problems.  Decompensated.  Used to be on antipsychotic but is not any longer.  Has been aggressive at home.  Labs reviewed.  His blood sugars elevated but not to a dramatic level.  Seems appropriate for admission.  Orders written to restart outpatient medicines including the Xanax and pain medications to avoid withdrawal.  Started Invega 3 mg at nighttime instead of Risperdal as it generally is a better long-acting injectable I think.  Case will be reviewed with inpatient treatment team.  Continue IVC.  Sister agreeable to plan.  Disposition: Patient does not meet criteria for psychiatric inpatient admission.  Mordecai Rasmussen, MD 05/16/2021 11:56 AM

## 2021-05-16 NOTE — BH Assessment (Signed)
This Clinical research associate, along with psych NP Madaline Brilliant.,  was unable to assess pt due to pt refusing to respond to any assessment questions. Pt was irritable and noted to be responding to internal stimuli.

## 2021-05-16 NOTE — BH Assessment (Signed)
Patient is to be admitted to [facility]U tonight 05/16/21 after 7:30pm by Psychiatric Nurse Practitioner and Dr. Toni Amend.  Attending Physician will be Dr. Neale Burly.   Patient has been assigned to room 302, by [facility] Charge Nurse,Gigi.    ER staff is aware of the admission: Luann,ER Secretary   Dr. Larinda Buttery, ER MD  Connye Burkitt, Patient's Nurse  Sue Lush, Patient Access.

## 2021-05-16 NOTE — ED Notes (Signed)
White tee shirt [employer] sneakers White socks [employer] Black watch

## 2021-05-16 NOTE — ED Notes (Signed)
Meal tray given to patient. Psych NP at bedside. Pt able to put clothes on himself with prompting.

## 2021-05-16 NOTE — BH Assessment (Signed)
TTS and Dr. Weber Cooks met with patient for reassessment. During interview, patient was moving his mouth with no sound. When asked a question by the provider, patient would stare and whisper to himself.   Per Dr. Weber Cooks, patient is recommended for inpatient psychiatric admission.

## 2021-05-16 NOTE — ED Notes (Signed)
Pt urinated in floor despite being aware that staff needed a urine sample. Cleaned up.  Hospital meal provided.  100% consumed, pt tolerated w/o complaints.  Waste discarded appropriately.

## 2021-05-16 NOTE — ED Notes (Signed)
Report called to [facility]

## 2021-05-17 DIAGNOSIS — E785 Hyperlipidemia, unspecified: Secondary | ICD-10-CM | POA: Insufficient documentation

## 2021-05-17 DIAGNOSIS — K219 Gastro-esophageal reflux disease without esophagitis: Secondary | ICD-10-CM | POA: Diagnosis present

## 2021-05-17 DIAGNOSIS — F203 Undifferentiated schizophrenia: Secondary | ICD-10-CM

## 2021-05-17 MED ORDER — GLIPIZIDE 5 MG PO TABS
5.0000 mg | ORAL_TABLET | Freq: Every day | ORAL | Status: DC
  Administered 2021-05-18 – 2021-05-23 (×5): 5 mg via ORAL
  Filled 2021-05-17 (×5): qty 1

## 2021-05-17 NOTE — Group Note (Signed)
LCSW Group Therapy Note  Patient on unit tested positive for COVID, group not held as precaution. Awaiting guidance from infection prevention. Situation ongoing, CSW will continue to monitor and update note as more information becomes available.   Corky Crafts, LCSWA 05/17/2021  1:30 PM

## 2021-05-17 NOTE — BH IP Treatment Plan (Signed)
Interdisciplinary Treatment and Diagnostic Plan Update  05/17/2021 Time of Session: 0900 Larry Cook MRN: [id number]  Principal Diagnosis: <principal problem not specified>  Secondary Diagnoses: Active Problems:   Schizophrenia ([place])   Current Medications:  Current Facility-Administered Medications  Medication Dose Route Frequency Provider Last Rate Last Admin   acetaminophen (TYLENOL) tablet 650 mg  650 mg Oral Q6H PRN [name], [name], MD       albuterol (PROVENTIL) (2.5 MG/3ML) 0.083% nebulizer solution 2.5 mg  2.5 mg Inhalation Q6H PRN [name], [name], MD       ALPRAZolam [name]) tablet 1 mg  1 mg Oral TID PRN [name], [name], MD       alum & mag hydroxide-simeth (MAALOX/MYLANTA) 200-200-20 MG/5ML suspension 30 mL  30 mL Oral Q4H PRN [name], [name], MD       amLODipine (NORVASC) tablet 5 mg  5 mg Oral Daily [name], [name], MD   5 mg at 05/17/21 2263   aspirin EC tablet 81 mg  81 mg Oral Daily [name], [name], MD   81 mg at 05/17/21 3354   gabapentin (NEURONTIN) capsule 100 mg  100 mg Oral BID [name], [name], MD   100 mg at 05/17/21 5625   HYDROcodone-acetaminophen (NORCO) 10-325 MG per tablet 1 tablet  1 tablet Oral Q6H PRN [name], [name], MD       hydrOXYzine (ATARAX/VISTARIL) tablet 50 mg  50 mg Oral Q6H PRN [name], [name], MD       insulin glargine-yfgn (SEMGLEE) injection 20 Units  20 Units Subcutaneous QHS [name], [name], MD       magnesium hydroxide (MILK OF MAGNESIA) suspension 30 mL  30 mL Oral Daily PRN [name], [name], MD       metFORMIN (GLUCOPHAGE) tablet 1,000 mg  1,000 mg Oral BID WC [name], [name], MD   1,000 mg at 05/17/21 6389   paliperidone (INVEGA) 24 hr tablet 3 mg  3 mg Oral QHS [name], [name], MD   3 mg at 05/16/21 2143   pantoprazole (PROTONIX) EC tablet 40 mg  40 mg Oral Daily [name], [name], MD   40 mg at 05/17/21 3734   pramipexole (MIRAPEX) tablet 0.25 mg  0.25 mg Oral QHS [name], [name], MD   0.25 mg at 05/16/21 2143   pravastatin (PRAVACHOL)  tablet 20 mg  20 mg Oral q1800 [name], [name], MD   20 mg at 05/16/21 2143   PTA Medications: Medications Prior to Admission  Medication Sig Dispense Refill Last Dose   ALPRAZolam (XANAX) 1 MG tablet Take 1 mg by mouth 3 (three) times daily as needed for sleep.  1    amLODipine (NORVASC) 5 MG tablet Take 5 mg by mouth daily.  2    aspirin EC 81 MG tablet Take 81 mg by mouth daily.      gabapentin (NEURONTIN) 100 MG capsule Take 100 mg by mouth 2 (two) times daily.  3    HYDROcodone-acetaminophen (NORCO) 10-325 MG tablet Take 1 tablet by mouth every 6 (six) hours as needed for pain.  0    insulin glargine (LANTUS) 100 UNIT/ML injection Inject 20 Units into the skin at bedtime.      lovastatin (MEVACOR) 40 MG tablet Take 40 mg by mouth daily with supper.  3    metFORMIN (GLUCOPHAGE) 1000 MG tablet Take 1,000 mg by mouth 2 (two) times daily.  3    omeprazole (PRILOSEC) 20 MG capsule Take 20 mg  by mouth 2 (two) times daily.  3    pramipexole (MIRAPEX) 0.5 MG tablet Take 0.5 mg by mouth 3 (three) times daily.      SYMBICORT 160-4.5 MCG/ACT inhaler Inhale 2 puffs into the lungs 2 (two) times daily.       Patient Stressors: Marital or family conflict   Medication change or noncompliance    Patient Strengths: Physical Health  Supportive family/friends   Treatment Modalities: Medication Management, Group therapy, Case management,  1 to 1 session with clinician, Psychoeducation, Recreational therapy.   Physician Treatment Plan for Primary Diagnosis: <principal problem not specified> Long Term Goal(s):     Short Term Goals:    Medication Management: Evaluate patient's response, side effects, and tolerance of medication regimen.  Therapeutic Interventions: 1 to 1 sessions, Unit Group sessions and Medication administration.  Evaluation of Outcomes: Not Met  Physician Treatment Plan for Secondary Diagnosis: Active Problems:   Schizophrenia ([place])  Long Term Goal(s):     Short Term  Goals:       Medication Management: Evaluate patient's response, side effects, and tolerance of medication regimen.  Therapeutic Interventions: 1 to 1 sessions, Unit Group sessions and Medication administration.  Evaluation of Outcomes: Not Met   RN Treatment Plan for Primary Diagnosis: <principal problem not specified> Long Term Goal(s): Knowledge of disease and therapeutic regimen to maintain health will improve  Short Term Goals: Ability to remain free from injury will improve, Ability to verbalize frustration and anger appropriately will improve, Ability to demonstrate self-control, Ability to participate in decision making will improve, Ability to verbalize feelings will improve, Ability to disclose and discuss suicidal ideas, Ability to identify and develop effective coping behaviors will improve, and Compliance with prescribed medications will improve  Medication Management: RN will administer medications as ordered by provider, will assess and evaluate patient's response and provide education to patient for prescribed medication. RN will report any adverse and/or side effects to prescribing provider.  Therapeutic Interventions: 1 on 1 counseling sessions, Psychoeducation, Medication administration, Evaluate responses to treatment, Monitor vital signs and CBGs as ordered, Perform/monitor CIWA, COWS, AIMS and Fall Risk screenings as ordered, Perform wound care treatments as ordered.  Evaluation of Outcomes: Not Met   LCSW Treatment Plan for Primary Diagnosis: <principal problem not specified> Long Term Goal(s): Safe transition to appropriate next level of care at discharge, Engage patient in therapeutic group addressing interpersonal concerns.  Short Term Goals: Engage patient in aftercare planning with referrals and resources, Increase social support, Increase ability to appropriately verbalize feelings, Increase emotional regulation, Facilitate acceptance of mental health diagnosis  and concerns, Facilitate patient progression through stages of change regarding substance use diagnoses and concerns, Identify triggers associated with mental health/substance abuse issues, and Increase skills for wellness and recovery  Therapeutic Interventions: Assess for all discharge needs, 1 to 1 time with Social worker, Explore available resources and support systems, Assess for adequacy in community support network, Educate family and significant other(s) on suicide prevention, Complete Psychosocial Assessment, Interpersonal group therapy.  Evaluation of Outcomes: Not Met   Progress in Treatment: Attending groups: No. Participating in groups: No. Taking medication as prescribed: Yes. Toleration medication: Yes. Family/Significant other contact made: Yes, individual(s) contacted:  SPE completed with patient, CSW will obtain consent to reach collateral.  Patient understands diagnosis: No. Discussing patient identified problems/goals with staff: No. Medical problems stabilized or resolved: Yes. Denies suicidal/homicidal ideation: Yes. Issues/concerns per patient self-inventory: Yes. Other: none  New problem(s) identified: No, Describe:  Patient currently exhibiting  psychotic features, largely disorganized. Unable to participate in treatment team meeting at this time. CSW will discuss goals with patient once progress has been made.   New Short Term/Long Term Goal(s): Patient to work toward detox, elimination of symptoms of psychosis, medication management for mood stabilization; elimination of SI thoughts; development of comprehensive mental wellness/sobriety plan.  Patient Goals:  No, Describe:  Patient currently exhibiting psychotic features, largely disorganized. Unable to participate in treatment team meeting at this time. CSW will discuss goals with patient once progress has been made.   Discharge Plan or Barriers: No barriers identified at this time.   Reason for Continuation of  Hospitalization: Hallucinations Other; describe Psychotic features.   Estimated Length of Stay: 1-7 days    Scribe for Treatment Team: Larose Kells 05/17/2021 9:44 AM

## 2021-05-17 NOTE — [facility] Counselor (Signed)
CSW attempted to meet with the patient to complete PSA.  Patient was observed to be awake and lying in his bed kicking his foot.  CSW notes that patient was moving his mouth as if speaking, though, CSW was unable to hear or understand what patient was saying.    CSW will attempt again at a later time.  Penni Homans, MSW, LCSW 05/17/2021 10:40 AM

## 2021-05-17 NOTE — Progress Notes (Signed)
Inpatient Diabetes Program Recommendations  AACE/ADA: New Consensus Statement on Inpatient Glycemic Control   Target Ranges:  Prepandial:   less than 140 mg/dL      Peak postprandial:   less than 180 mg/dL (1-2 hours)      Critically ill patients:  140 - 180 mg/dL   Results for GILMAR, BUA (MRN [id number]) as of 05/17/2021 09:33  Ref. Range 05/16/2021 01:51  Glucose Latest Ref Range: 70 - 99 mg/dL 254 (H)  Results for KENZO, OZMENT (MRN [id number]) as of 05/17/2021 09:33  Ref. Range 05/16/2021 01:51  Hemoglobin A1C Latest Ref Range: 4.8 - 5.6 % 10.9 (H)   Review of Glycemic Control  Diabetes history: DM2 Outpatient Diabetes medications: Lantus 20 units QHS, Metformin 1000 mg BID Current orders for Inpatient glycemic control: Semglee 20 units QHS, Metformin 1000 mg BID  Inpatient Diabetes Program Recommendations:    Inpatient DM medications: Per MAR, patient refused Semglee last night.   If patient agreeable to take insulin, please consider ordering Novolog 0-9 units TID with meals and Novolog 0-5 units QHS.  If patient continues to refuse insulin, may want to consider adding additional oral DM medication such as Glipizide 5 mg daily.  Thanks, Orlando Penner, RN, MSN, CDE Diabetes Coordinator Inpatient Diabetes Program [phone] (Team Pager from 8am to 5pm)

## 2021-05-17 NOTE — H&P (Signed)
Psychiatric Admission Assessment Adult  Patient Identification: Larry Cook MRN:  [id number] Date of Evaluation:  05/17/2021 Chief Complaint:  Schizophrenia (HCC) [F20.9] Principal Diagnosis: Schizophrenia (HCC) Diagnosis:  Principal Problem:   Schizophrenia (HCC) Active Problems:   Asthma   Hypertension   Dyslipidemia   GERD (gastroesophageal reflux disease)   Diabetes mellitus type 2, uncomplicated (HCC)  CC Admission assessment for 60 year old male with history of schizophrenia  History of Present Illness: 60 year old male brought to emergency room by his sister due to worsening mental health, increased aggression, and poor self care. Patient declined to attend treatment team. One-on-one assessment he does say something to this provider. However, voice was too quiet to understand what was said, and patient refused to repeat. He also remained mute for the entire interview. Does not respond when asked about SI/HI/AH/VH. Does not participate in review of symptoms. Patient appears to be responding to internal stimuli in the room, and he is also noted to have tardive dyskinesia most notable in the perioral region as well as left lower extremity.   Per chart review, patient has chronic mental health history of schizophrenia.Sister reported that his behavior acutely worsened in the last two weeks where he became aggressive, swung a chair at sister in kitchen, and has been making inappropriate sexual comments to younger members of the household. No recent illness or change to medications. He was previously on risperdal consta, but was discontinued due to unknown reasons. Currently only on Xanax 1 mg TID via Dr. Judithann Sheen.   Associated Signs/Symptoms: Depression Symptoms:  psychomotor agitation, Duration of Depression Symptoms: No data recorded (Hypo) Manic Symptoms:  Hallucinations, Irritable Mood, Sexually Inapproprite Behavior, Anxiety Symptoms:   Denies Psychotic Symptoms:  Hallucinations:  Auditory Paranoia, PTSD Symptoms: Negative Total Time spent with patient: 45 minutes  Past Psychiatric History: History of schizophrenia. Sister says that many years ago he had been in hospitals but cannot remember when.  It looks like in the past he used to receive Risperdal Consta 50 mg every 2 weeks but at some point that was discontinued.  Sister says he just does not get it anymore there is no particular reason why.  Only psychiatric medicine right now is Xanax 1 mg 3 times a day provided by Dr. Judithann Sheen.    Is the patient at risk to self? Yes.    Has the patient been a risk to self in the past 6 months? No.  Has the patient been a risk to self within the distant past? Yes.    Is the patient a risk to others? Yes.    Has the patient been a risk to others in the past 6 months? No.  Has the patient been a risk to others within the distant past? No.   Prior Inpatient Therapy:   Prior Outpatient Therapy:    Alcohol Screening: Patient refused Alcohol Screening Tool: Yes 1. How often do you have a drink containing alcohol?: Never 2. How many drinks containing alcohol do you have on a typical day when you are drinking?: 1 or 2 3. How often do you have six or more drinks on one occasion?: Never AUDIT-C Score: 0 4. How often during the last year have you found that you were not able to stop drinking once you had started?: Never 5. How often during the last year have you failed to do what was normally expected from you because of drinking?: Never 6. How often during the last year have you needed a first drink in the  morning to get yourself going after a heavy drinking session?: Never 7. How often during the last year have you had a feeling of guilt of remorse after drinking?: Never 8. How often during the last year have you been unable to remember what happened the night before because you had been drinking?: Never 9. Have you or someone else been injured as a result of your drinking?: No 10.  Has a relative or friend or a doctor or another health worker been concerned about your drinking or suggested you cut down?: No Alcohol Use Disorder Identification Test Final Score (AUDIT): 0 Substance Abuse History in the last 12 months:  No. Consequences of Substance Abuse: Negative Previous Psychotropic Medications: Yes  Psychological Evaluations: Yes  Past Medical History:  Past Medical History:  Diagnosis Date   Diabetes mellitus without complication (HCC)    Hypertension    Mentally disabled    Schizophrenia (HCC)    History reviewed. No pertinent surgical history. Family History: History reviewed. No pertinent family history. Family Psychiatric  History: None reported Tobacco Screening:   Social History:  Social History   Substance and Sexual Activity  Alcohol Use No     Social History   Substance and Sexual Activity  Drug Use No    Additional Social History:                           Allergies:   Allergies  Allergen Reactions   Acetaminophen-Codeine     Other reaction(s): Unknown   Lab Results:  Results for orders placed or performed during the hospital encounter of 05/16/21 (from the past 48 hour(s))  Comprehensive metabolic panel     Status: Abnormal   Collection Time: 05/16/21  1:51 AM  Result Value Ref Range   Sodium 136 135 - 145 mmol/L   Potassium 4.1 3.5 - 5.1 mmol/L   Chloride 96 (L) 98 - 111 mmol/L   CO2 27 22 - 32 mmol/L   Glucose, Bld 226 (H) 70 - 99 mg/dL    Comment: Glucose reference range applies only to samples taken after fasting for at least 8 hours.   BUN 22 (H) 6 - 20 mg/dL   Creatinine, Ser 2.13 (H) 0.61 - 1.24 mg/dL   Calcium 9.1 8.9 - 08.6 mg/dL   Total Protein 7.9 6.5 - 8.1 g/dL   Albumin 3.6 3.5 - 5.0 g/dL   AST 29 15 - 41 U/L   ALT 19 0 - 44 U/L   Alkaline Phosphatase 114 38 - 126 U/L   Total Bilirubin 0.5 0.3 - 1.2 mg/dL   GFR, Estimated 48 (L) >60 mL/min    Comment: (NOTE) Calculated using the CKD-EPI Creatinine  Equation (2021)    Anion gap 13 5 - 15    Comment: Performed at [facility], [address] [place], [place] [address]  Ethanol     Status: None   Collection Time: 05/16/21  1:51 AM  Result Value Ref Range   Alcohol, Ethyl (B) <10 <10 mg/dL    Comment: (NOTE) Lowest detectable limit for serum alcohol is 10 mg/dL.  For medical purposes only. Performed at [facility], [address] [place], [place] [address]   cbc     Status: Abnormal   Collection Time: 05/16/21  1:51 AM  Result Value Ref Range   WBC 8.6 4.0 - 10.5 K/uL   RBC 4.10 (L) 4.22 - 5.81 MIL/uL   Hemoglobin 11.9 (L)  13.0 - 17.0 g/dL   HCT 16.1 (L) 09.6 - 04.5 %   MCV 89.0 80.0 - 100.0 fL   MCH 29.0 26.0 - 34.0 pg   MCHC 32.6 30.0 - 36.0 g/dL   RDW 40.9 81.1 - 91.4 %   Platelets 291 150 - 400 K/uL   nRBC 0.0 0.0 - 0.2 %    Comment: Performed at [facility], [address] [place], [place] [address]  Salicylate level     Status: Abnormal   Collection Time: 05/16/21  1:51 AM  Result Value Ref Range   Salicylate Lvl <7.0 (L) 7.0 - 30.0 mg/dL    Comment: Performed at [facility], [address] [place], [place] [address]  Acetaminophen level     Status: Abnormal   Collection Time: 05/16/21  1:51 AM  Result Value Ref Range   Acetaminophen (Tylenol), Serum <10 (L) 10 - 30 ug/mL    Comment: (NOTE) Therapeutic concentrations vary significantly. A range of 10-30 ug/mL  may be an effective concentration for many patients. However, some  are best treated at concentrations outside of this range. Acetaminophen concentrations >150 ug/mL at 4 hours after ingestion  and >50 ug/mL at 12 hours after ingestion are often associated with  toxic reactions.  Performed at [facility], [address] [place], [place] [address]   Lipid panel     Status: Abnormal   Collection Time: 05/16/21  1:51 AM  Result Value Ref Range   Cholesterol 194 0 - 200 mg/dL   Triglycerides 469  (H) <150 mg/dL   HDL 80 >62 mg/dL   Total CHOL/HDL Ratio 2.4 RATIO   VLDL 37 0 - 40 mg/dL   LDL Cholesterol 77 0 - 99 mg/dL    Comment:        Total Cholesterol/HDL:CHD Risk Coronary Heart Disease Risk Table                     Men   Women  1/2 Average Risk   3.4   3.3  Average Risk       5.0   4.4  2 X Average Risk   9.6   7.1  3 X Average Risk  23.4   11.0        Use the calculated Patient Ratio above and the CHD Risk Table to determine the patient's CHD Risk.        ATP III CLASSIFICATION (LDL):  <100     mg/dL   Optimal  952-841  mg/dL   Near or Above                    Optimal  130-159  mg/dL   Borderline  324-401  mg/dL   High  >027     mg/dL   Very High Performed at [facility], [address] [place], [place] [address]   Hemoglobin A1c     Status: Abnormal   Collection Time: 05/16/21  1:51 AM  Result Value Ref Range   Hgb A1c MFr Bld 10.9 (H) 4.8 - 5.6 %    Comment: (NOTE)         Prediabetes: 5.7 - 6.4         Diabetes: >6.4         Glycemic control for adults with diabetes: <7.0    Mean Plasma Glucose 266 mg/dL    Comment: (NOTE) Performed At: [facility] [address] [place], [place] [id number] Clovis Riley  Claudie Fisherman MD FB:[phone]   Resp Panel by RT-PCR (Flu A&B, Covid) Nasopharyngeal Swab     Status: None   Collection Time: 05/16/21  6:12 AM   Specimen: Nasopharyngeal Swab; Nasopharyngeal(NP) swabs in vial transport medium  Result Value Ref Range   SARS Coronavirus 2 by RT PCR NEGATIVE NEGATIVE    Comment: (NOTE) SARS-CoV-2 target nucleic acids are NOT DETECTED.  The SARS-CoV-2 RNA is generally detectable in upper respiratory specimens during the acute phase of infection. The lowest concentration of SARS-CoV-2 viral copies this assay can detect is 138 copies/mL. A negative result does not preclude SARS-Cov-2 infection and should not be used as the sole basis for treatment or other patient management decisions. A negative result may  occur with  improper specimen collection/handling, submission of specimen other than nasopharyngeal swab, presence of viral mutation(s) within the areas targeted by this assay, and inadequate number of viral copies(<138 copies/mL). A negative result must be combined with clinical observations, patient history, and epidemiological information. The expected result is Negative.  Fact Sheet for Patients:  BloggerCourse.com  Fact Sheet for Healthcare Providers:  SeriousBroker.it  This test is no t yet approved or cleared by the [place] FDA and  has been authorized for detection and/or diagnosis of SARS-CoV-2 by FDA under an Emergency Use Authorization (EUA). This EUA will remain  in effect (meaning this test can be used) for the duration of the COVID-19 declaration under Section 564(b)(1) of the Act, 21 U.S.C.section 360bbb-3(b)(1), unless the authorization is terminated  or revoked sooner.       Influenza A by PCR NEGATIVE NEGATIVE   Influenza B by PCR NEGATIVE NEGATIVE    Comment: (NOTE) The Xpert Xpress SARS-CoV-2/FLU/RSV plus assay is intended as an aid in the diagnosis of influenza from Nasopharyngeal swab specimens and should not be used as a sole basis for treatment. Nasal washings and aspirates are unacceptable for Xpert Xpress SARS-CoV-2/FLU/RSV testing.  Fact Sheet for Patients: BloggerCourse.com  Fact Sheet for Healthcare Providers: SeriousBroker.it  This test is not yet approved or cleared by the [place] FDA and has been authorized for detection and/or diagnosis of SARS-CoV-2 by FDA under an Emergency Use Authorization (EUA). This EUA will remain in effect (meaning this test can be used) for the duration of the COVID-19 declaration under Section 564(b)(1) of the Act, 21 U.S.C. section 360bbb-3(b)(1), unless the authorization is terminated  or revoked.  Performed at [facility], [address] [place], [place] [address]   Urine Drug Screen, Qualitative     Status: Abnormal   Collection Time: 05/16/21  8:07 PM  Result Value Ref Range   Tricyclic, Ur Screen NONE DETECTED NONE DETECTED   Amphetamines, Ur Screen NONE DETECTED NONE DETECTED   MDMA (Ecstasy)Ur Screen NONE DETECTED NONE DETECTED   Cocaine Metabolite,Ur [place] NONE DETECTED NONE DETECTED   Opiate, Ur Screen POSITIVE (A) NONE DETECTED   Phencyclidine (PCP) Ur S NONE DETECTED NONE DETECTED   Cannabinoid 50 Ng, Ur [place] NONE DETECTED NONE DETECTED   Barbiturates, Ur Screen NONE DETECTED NONE DETECTED   Benzodiazepine, Ur Scrn NONE DETECTED NONE DETECTED   Methadone Scn, Ur NONE DETECTED NONE DETECTED    Comment: (NOTE) Tricyclics + metabolites, urine    Cutoff 1000 ng/mL Amphetamines + metabolites, urine  Cutoff 1000 ng/mL MDMA (Ecstasy), urine              Cutoff 500 ng/mL Cocaine Metabolite, urine          Cutoff 300 ng/mL  Opiate + metabolites, urine        Cutoff 300 ng/mL Phencyclidine (PCP), urine         Cutoff 25 ng/mL Cannabinoid, urine                 Cutoff 50 ng/mL Barbiturates + metabolites, urine  Cutoff 200 ng/mL Benzodiazepine, urine              Cutoff 200 ng/mL Methadone, urine                   Cutoff 300 ng/mL  The urine drug screen provides only a preliminary, unconfirmed analytical test result and should not be used for non-medical purposes. Clinical consideration and professional judgment should be applied to any positive drug screen result due to possible interfering substances. A more specific alternate chemical method must be used in order to obtain a confirmed analytical result. Gas chromatography / mass spectrometry (GC/MS) is the preferred confirm atory method. Performed at [facility], [address] [place], [place] [address]     Blood Alcohol level:  Lab Results  Component Value Date   [facility] <10  05/16/2021    Metabolic Disorder Labs:  Lab Results  Component Value Date   HGBA1C 10.9 (H) 05/16/2021   MPG 266 05/16/2021   No results found for: PROLACTIN Lab Results  Component Value Date   CHOL 194 05/16/2021   TRIG 183 (H) 05/16/2021   HDL 80 05/16/2021   CHOLHDL 2.4 05/16/2021   VLDL 37 05/16/2021   LDLCALC 77 05/16/2021    Current Medications: Current Facility-Administered Medications  Medication Dose Route Frequency Provider Last Rate Last Admin   acetaminophen (TYLENOL) tablet 650 mg  650 mg Oral Q6H PRN [name], [name], MD       albuterol (PROVENTIL) (2.5 MG/3ML) 0.083% nebulizer solution 2.5 mg  2.5 mg Inhalation Q6H PRN [name], Jackquline [place], MD       ALPRAZolam Prudy Feeler) tablet 1 mg  1 mg Oral TID PRN [name], Jackquline Denmark, MD       alum & mag hydroxide-simeth (MAALOX/MYLANTA) 200-200-20 MG/5ML suspension 30 mL  30 mL Oral Q4H PRN [name], Jackquline Denmark, MD       amLODipine (NORVASC) tablet 5 mg  5 mg Oral Daily [name], [name], MD   5 mg at 05/17/21 6045   aspirin EC tablet 81 mg  81 mg Oral Daily [name], Jackquline Denmark, MD   81 mg at 05/17/21 4098   gabapentin (NEURONTIN) capsule 100 mg  100 mg Oral BID [name], [name], MD   100 mg at 05/17/21 0821   [START ON 05/18/2021] glipiZIDE (GLUCOTROL) tablet 5 mg  5 mg Oral QAC breakfast Jesse Sans, MD       HYDROcodone-acetaminophen (NORCO) 10-325 MG per tablet 1 tablet  1 tablet Oral Q6H PRN [name], Jackquline Denmark, MD       hydrOXYzine (ATARAX/VISTARIL) tablet 50 mg  50 mg Oral Q6H PRN [name], [name], MD       magnesium hydroxide (MILK OF MAGNESIA) suspension 30 mL  30 mL Oral Daily PRN [name], [name], MD       metFORMIN (GLUCOPHAGE) tablet 1,000 mg  1,000 mg Oral BID WC [name], [name], MD   1,000 mg at 05/17/21 0821   paliperidone (INVEGA) 24 hr tablet 3 mg  3 mg Oral QHS [name], [name], MD   3 mg at 05/16/21 2143   pantoprazole (PROTONIX) EC tablet 40 mg  40 mg  Oral Daily [name], Jackquline Denmark, MD   40 mg at 05/17/21 4034   pramipexole (MIRAPEX)  tablet 0.25 mg  0.25 mg Oral QHS [name], [name], MD   0.25 mg at 05/16/21 2143   pravastatin (PRAVACHOL) tablet 20 mg  20 mg Oral q1800 [name], [name], MD   20 mg at 05/16/21 2143   PTA Medications: Medications Prior to Admission  Medication Sig Dispense Refill Last Dose   ALPRAZolam (XANAX) 1 MG tablet Take 1 mg by mouth 3 (three) times daily as needed for sleep.  1    amLODipine (NORVASC) 5 MG tablet Take 5 mg by mouth daily.  2    aspirin EC 81 MG tablet Take 81 mg by mouth daily.      gabapentin (NEURONTIN) 100 MG capsule Take 100 mg by mouth 2 (two) times daily.  3    HYDROcodone-acetaminophen (NORCO) 10-325 MG tablet Take 1 tablet by mouth every 6 (six) hours as needed for pain.  0    insulin glargine (LANTUS) 100 UNIT/ML injection Inject 20 Units into the skin at bedtime.      lovastatin (MEVACOR) 40 MG tablet Take 40 mg by mouth daily with supper.  3    metFORMIN (GLUCOPHAGE) 1000 MG tablet Take 1,000 mg by mouth 2 (two) times daily.  3    omeprazole (PRILOSEC) 20 MG capsule Take 20 mg by mouth 2 (two) times daily.  3    pramipexole (MIRAPEX) 0.5 MG tablet Take 0.5 mg by mouth 3 (three) times daily.      SYMBICORT 160-4.5 MCG/ACT inhaler Inhale 2 puffs into the lungs 2 (two) times daily.       Musculoskeletal: Strength & Muscle Tone: within normal limits Gait & Station: normal Patient leans: N/A            Psychiatric Specialty Exam:  Presentation  General Appearance: Casual  Eye Contact:Fair  Speech:Blocked  Speech Volume:Decreased  Handedness:Right   Mood and Affect  Mood:Irritable  Affect:Flat   Thought Process  Thought Processes:Disorganized  Duration of Psychotic Symptoms: Greater than six months  Past Diagnosis of Schizophrenia or Psychoactive disorder: Yes  Descriptions of Associations:Loose  Orientation:Full (Time, Place and Person)  Thought Content:Paranoid Ideation  Hallucinations:Hallucinations: Other (comment) (Patient mostly  nonverbal, but appears to be responding to internal stimuli)  Ideas of Reference:Paranoia  Suicidal Thoughts:Suicidal Thoughts: No  Homicidal Thoughts:Homicidal Thoughts: No   Sensorium  Memory:Immediate Poor; Recent Poor; Remote Poor  Judgment:Impaired  Insight:Poor   Executive Functions  Concentration:Poor  Attention Span:Poor  Recall:Poor  Fund of Knowledge:Poor  Language:Poor   Psychomotor Activity  Psychomotor Activity:Psychomotor Activity: Extrapyramidal Side Effects (EPS) Extrapyramidal Side Effects (EPS): Tardive Dyskinesia AIMS Completed?: Yes   Assets  Assets:Financial Resources/Insurance; Housing   Sleep  Sleep:Sleep: Fair Number of Hours of Sleep: 6.75    Physical Exam: Physical Exam Vitals and nursing note reviewed.  Constitutional:      General: He is not in acute distress. HENT:     Head: Normocephalic and atraumatic.     Right Ear: External ear normal.     Left Ear: External ear normal.     Nose: Nose normal.     Mouth/Throat:     Mouth: Mucous membranes are moist.     Pharynx: Oropharynx is clear.  Eyes:     Extraocular Movements: Extraocular movements intact.     Conjunctiva/sclera: Conjunctivae normal.     Pupils: Pupils are equal, round, and reactive to light.  Cardiovascular:  Rate and Rhythm: Normal rate.     Pulses: Normal pulses.  Pulmonary:     Effort: Pulmonary effort is normal.     Breath sounds: Normal breath sounds.  Abdominal:     General: Abdomen is flat.     Palpations: Abdomen is soft.  Musculoskeletal:        General: No swelling. Normal range of motion.     Cervical back: Normal range of motion and neck supple.  Skin:    General: Skin is warm and dry.  Neurological:     General: No focal deficit present.     Mental Status: He is alert.     Cranial Nerves: No cranial nerve deficit.  Psychiatric:        Attention and Perception: He is inattentive.        Mood and Affect: Affect is flat.         Speech: He is noncommunicative.        Behavior: Behavior is withdrawn.        Thought Content: Thought content is paranoid.        Cognition and Memory: Cognition is impaired. Memory is impaired.        Judgment: Judgment is inappropriate.   Review of Systems  Unable to perform ROS: Acuity of condition  Blood pressure (!) 131/114, pulse 81, temperature 97.8 F (36.6 C), temperature source Oral, resp. rate 17, height 4' (1.219 m), weight 95.3 kg, SpO2 97 %. Body mass index is 64.11 kg/m.  Treatment Plan Summary: Daily contact with patient to assess and evaluate symptoms and progress in treatment and Medication management  Schizophrenia - Continue Invega 3 mg daily, titrate to effect and plan to transition to [place]  Anxiety - Ativan 1 mg TID PRN  HTN - Amlodipine 5 mg daily, ASA 81 mg  Diabetes Mellitus Type 2 - Metformin 1000 mg BID with meals, glipizide 5 mg with breakfast, carb modified diet  Restless Leg Syndrome - Mirapex 0.25 mg daily  Chronic pain - Gabapentin 100 mg BID, Norco 10-325 Q6hr PRN for severe pain  GERD - Protonix 40 mg daily  HLD - Pravastatin 20 mg daily   Observation Level/Precautions:  15 minute checks  Laboratory: lipid panel  Psychotherapy:    Medications:    Consultations:    Discharge Concerns:    Estimated LOS:  Other:     Physician Treatment Plan for Primary Diagnosis: Schizophrenia (HCC) Long Term Goal(s): Improvement in symptoms so as ready for discharge  Short Term Goals: Ability to identify changes in lifestyle to reduce recurrence of condition will improve, Ability to verbalize feelings will improve, Ability to disclose and discuss suicidal ideas, Ability to demonstrate self-control will improve, Ability to identify and develop effective coping behaviors will improve, Ability to maintain clinical measurements within normal limits will improve, Compliance with prescribed medications will improve, and Ability to identify  triggers associated with substance abuse/mental health issues will improve  Physician Treatment Plan for Secondary Diagnosis: Principal Problem:   Schizophrenia (HCC) Active Problems:   Asthma   Hypertension   Dyslipidemia   GERD (gastroesophageal reflux disease)   Diabetes mellitus type 2, uncomplicated (HCC)  Long Term Goal(s): Improvement in symptoms so as ready for discharge  Short Term Goals: Ability to identify changes in lifestyle to reduce recurrence of condition will improve, Ability to verbalize feelings will improve, Ability to disclose and discuss suicidal ideas, Ability to demonstrate self-control will improve, Ability to identify and develop effective coping behaviors will  improve, Ability to maintain clinical measurements within normal limits will improve, Compliance with prescribed medications will improve, and Ability to identify triggers associated with substance abuse/mental health issues will improve  I certify that inpatient services furnished can reasonably be expected to improve the patient's condition.    Jesse Sans, MD 9/28/202212:28 PM

## 2021-05-17 NOTE — BHH Suicide Risk Assessment (Signed)
[facility] Admission Suicide Risk Assessment   Nursing information obtained from:  Patient Demographic factors:  NA Current Mental Status:  NA Loss Factors:  NA Historical Factors:  NA Risk Reduction Factors:  NA  Total Time spent with patient: 45 minutes Principal Problem: Schizophrenia (HCC) Diagnosis:  Principal Problem:   Schizophrenia (HCC) Active Problems:   Asthma   Hypertension   Dyslipidemia   GERD (gastroesophageal reflux disease)   Diabetes mellitus type 2, uncomplicated (HCC)  Subjective Data:  60 year old male brought to emergency room by his sister due to worsening mental health, increased aggression, and poor self care. Patient declined to attend treatment team. One-on-one assessment he does say something to this provider. However, voice was too quiet to understand what was said, and patient refused to repeat. He also remained mute for the entire interview. Does not respond when asked about SI/HI/AH/VH. Does not participate in review of symptoms. Patient appears to be responding to internal stimuli in the room, and he is also noted to have tardive dyskinesia most notable in the perioral region as well as left lower extremity.    Per chart review, patient has chronic mental health history of schizophrenia.Sister reported that his behavior acutely worsened in the last two weeks where he became aggressive, swung a chair at sister in kitchen, and has been making inappropriate sexual comments to younger members of the household. No recent illness or change to medications. He was previously on risperdal consta, but was discontinued due to unknown reasons. Currently only on Xanax 1 mg TID via Dr. Judithann Sheen.   Continued Clinical Symptoms:  Alcohol Use Disorder Identification Test Final Score (AUDIT): 0 The "Alcohol Use Disorders Identification Test", Guidelines for Use in Primary Care, Second Edition.  World Science writer [facility]). Score between 0-7:  no or low risk or alcohol related  problems. Score between 8-15:  moderate risk of alcohol related problems. Score between 16-19:  high risk of alcohol related problems. Score 20 or above:  warrants further diagnostic evaluation for alcohol dependence and treatment.   CLINICAL FACTORS:   Schizophrenia:   Paranoid or undifferentiated type Chronic Pain Currently Psychotic Unstable or Poor Therapeutic Relationship Previous Psychiatric Diagnoses and Treatments Medical Diagnoses and Treatments/Surgeries   Musculoskeletal: Strength & Muscle Tone: within normal limits Gait & Station: normal Patient leans: N/A  Psychiatric Specialty Exam:  Presentation  General Appearance: Casual  Eye Contact:Fair  Speech:Blocked  Speech Volume:Decreased  Handedness:Right   Mood and Affect  Mood:Irritable  Affect:Flat   Thought Process  Thought Processes:Disorganized  Descriptions of Associations:Loose  Orientation:Full (Time, Place and Person)  Thought Content:Paranoid Ideation  History of Schizophrenia/Schizoaffective disorder:Yes  Duration of Psychotic Symptoms:Greater than six months  Hallucinations:Hallucinations: Other (comment) (Patient mostly nonverbal, but appears to be responding to internal stimuli)  Ideas of Reference:Paranoia  Suicidal Thoughts:Suicidal Thoughts: No  Homicidal Thoughts:Homicidal Thoughts: No   Sensorium  Memory:Immediate Poor; Recent Poor; Remote Poor  Judgment:Impaired  Insight:Poor   Executive Functions  Concentration:Poor  Attention Span:Poor  Recall:Poor  Fund of Knowledge:Poor  Language:Poor   Psychomotor Activity  Psychomotor Activity:Psychomotor Activity: Extrapyramidal Side Effects (EPS) Extrapyramidal Side Effects (EPS): Tardive Dyskinesia AIMS Completed?: Yes   Assets  Assets:Financial Resources/Insurance; Housing   Sleep  Sleep:Sleep: Fair Number of Hours of Sleep: 6.75    Physical Exam: Physical Exam ROS Blood pressure (!) 131/114,  pulse 81, temperature 97.8 F (36.6 C), temperature source Oral, resp. rate 17, height 4' (1.219 m), weight 95.3 kg, SpO2 97 %. Body mass index is 64.11  kg/m.   COGNITIVE FEATURES THAT CONTRIBUTE TO RISK:  Loss of executive function    SUICIDE RISK:   Mild:  Suicidal ideation of limited frequency, intensity, duration, and specificity.  There are no identifiable plans, no associated intent, mild dysphoria and related symptoms, good self-control (both objective and subjective assessment), few other risk factors, and identifiable protective factors, including available and accessible social support.  PLAN OF CARE: Continue inpatient admission, see H&P for details.   I certify that inpatient services furnished can reasonably be expected to improve the patient's condition.   Jesse Sans, MD 05/17/2021, 12:39 PM

## 2021-05-17 NOTE — Progress Notes (Signed)
D: Pt alert and oriented. Pt does not confirm nor deny experiencing any anxiety/depression at this time.Pt does not confirm or deny experiencing any pain at this time. Pt does not confirm or deny experiencing any SI/HI, or AVH at this time.   Pt does not answer any questions. MHT is able to get pt to come and eat. Pt just throws hands up in the arm or laughs at time when presented with questions. Pt also at time with turn head to side and answer in a soft voice, this writer is unable to hear what pt said. Pt will not repeat self only shake head and/or laugh. Pt is complaint with taking medications.  A: Scheduled medications administered to pt, per MD orders. Support and encouragement provided. Frequent verbal contact made. Routine safety checks conducted q15 minutes.   R: No adverse drug reactions noted. Pt verbally contracts for safety at this time. Pt complaint with medications. Pt does no initiate interacts with others on the unit. Pt remains safe at this time. Will continue to monitor.

## 2021-05-17 NOTE — Progress Notes (Signed)
Recreation Therapy Notes  Date: 05/17/2021  Time: 10:00 am   Location: Craft room   Behavioral response: N/A   Intervention Topic: Time Management    Discussion/Intervention: Patient did not attend group.   Clinical Observations/Feedback:  Patient did not attend group.   Marita Burnsed LRT/CTRS         Maan Zarcone 05/17/2021 11:03 AM

## 2021-05-17 NOTE — Progress Notes (Signed)
The only words this pt has spoken to me all day after telling pt it's nice to meet you this morning and to have a good day was have a good morning. This even when asked if he was going to eat dinner, dinner is here pt stated why did you ask me that, then threw hands up in air.   Pt mostly does not speak. Pt often stares off to nowhere particular. Pt appear delayed. Pt is mostly seen sitting in chair in room.

## 2021-05-17 NOTE — Progress Notes (Signed)
Call received from his sister, [phone]. Update provided on progress in hospital and current medication regimen. She requests to be given updates on progress.

## 2021-05-18 ENCOUNTER — Inpatient Hospital Stay: Payer: Medicare Other

## 2021-05-18 LAB — GLUCOSE, CAPILLARY: Glucose-Capillary: 109 mg/dL — ABNORMAL HIGH (ref 70–99)

## 2021-05-18 LAB — CBC WITH DIFFERENTIAL/PLATELET
Abs Immature Granulocytes: 0.02 10*3/uL (ref 0.00–0.07)
Basophils Absolute: 0 10*3/uL (ref 0.0–0.1)
Basophils Relative: 0 %
Eosinophils Absolute: 0.2 10*3/uL (ref 0.0–0.5)
Eosinophils Relative: 2 %
HCT: 38.6 % — ABNORMAL LOW (ref 39.0–52.0)
Hemoglobin: 12.5 g/dL — ABNORMAL LOW (ref 13.0–17.0)
Immature Granulocytes: 0 %
Lymphocytes Relative: 26 %
Lymphs Abs: 1.8 10*3/uL (ref 0.7–4.0)
MCH: 29.6 pg (ref 26.0–34.0)
MCHC: 32.4 g/dL (ref 30.0–36.0)
MCV: 91.3 fL (ref 80.0–100.0)
Monocytes Absolute: 0.6 10*3/uL (ref 0.1–1.0)
Monocytes Relative: 9 %
Neutro Abs: 4.4 10*3/uL (ref 1.7–7.7)
Neutrophils Relative %: 63 %
Platelets: 308 10*3/uL (ref 150–400)
RBC: 4.23 MIL/uL (ref 4.22–5.81)
RDW: 14.9 % (ref 11.5–15.5)
WBC: 7 10*3/uL (ref 4.0–10.5)
nRBC: 0 % (ref 0.0–0.2)

## 2021-05-18 LAB — COMPREHENSIVE METABOLIC PANEL
ALT: 23 U/L (ref 0–44)
AST: 35 U/L (ref 15–41)
Albumin: 3.5 g/dL (ref 3.5–5.0)
Alkaline Phosphatase: 99 U/L (ref 38–126)
Anion gap: 11 (ref 5–15)
BUN: 29 mg/dL — ABNORMAL HIGH (ref 6–20)
CO2: 29 mmol/L (ref 22–32)
Calcium: 9.4 mg/dL (ref 8.9–10.3)
Chloride: 97 mmol/L — ABNORMAL LOW (ref 98–111)
Creatinine, Ser: 1.85 mg/dL — ABNORMAL HIGH (ref 0.61–1.24)
GFR, Estimated: 41 mL/min — ABNORMAL LOW (ref >60)
Glucose, Bld: 184 mg/dL — ABNORMAL HIGH (ref 70–99)
Potassium: 3.9 mmol/L (ref 3.5–5.1)
Sodium: 137 mmol/L (ref 135–145)
Total Bilirubin: 0.7 mg/dL (ref 0.3–1.2)
Total Protein: 7.8 g/dL (ref 6.5–8.1)

## 2021-05-18 LAB — LIPID PANEL
Cholesterol: 181 mg/dL (ref 0–200)
HDL: 68 mg/dL (ref >40)
LDL Cholesterol: 84 mg/dL (ref 0–99)
Total CHOL/HDL Ratio: 2.7 RATIO
Triglycerides: 146 mg/dL (ref <150)
VLDL: 29 mg/dL (ref 0–40)

## 2021-05-18 LAB — AMMONIA: Ammonia: 11 umol/L (ref 9–35)

## 2021-05-18 MED ORDER — ALBUTEROL SULFATE HFA 108 (90 BASE) MCG/ACT IN AERS
2.0000 | INHALATION_SPRAY | RESPIRATORY_TRACT | Status: DC | PRN
  Administered 2021-05-22: 2 via RESPIRATORY_TRACT
  Filled 2021-05-18: qty 6.7

## 2021-05-18 MED ORDER — ALPRAZOLAM 0.5 MG PO TABS
2.0000 mg | ORAL_TABLET | Freq: Every day | ORAL | Status: DC
  Administered 2021-05-18: 2 mg via ORAL
  Filled 2021-05-18: qty 4

## 2021-05-18 NOTE — Progress Notes (Signed)
Recreation Therapy Notes  Date: 05/18/2021  Time: 10:00 am   Location: Craft room   Behavioral response: N/A   Intervention Topic: Goals   Discussion/Intervention: Patient did not attend group.   Clinical Observations/Feedback:  Patient did not attend group.   Kamarius Buckbee LRT/CTRS        Abiola Behring 05/18/2021 12:02 PM

## 2021-05-18 NOTE — Progress Notes (Signed)
[facility] MD Progress Note  05/18/2021 12:12 PM Larry Cook  MRN:  [id number]  CC: Follow-up for schizophrenia  Subjective:  60 year old male brought to emergency room by his sister due to worsening mental health, increased aggression, and poor self care. Overnight only slept 1 hour and was observed responding to internal stimuli. Thus far, medication compliant without any behavioral outbursts. He is eating well. Patient seen one-on-one today. He is sleeping, but after shaking his leg he does open his eyes to look at me. He does not answer any questions again today. Once patient awake, will offer him phone to speak with his sister.   Principal Problem: Schizophrenia (HCC) Diagnosis: Principal Problem:   Schizophrenia (HCC) Active Problems:   Asthma   Hypertension   Dyslipidemia   GERD (gastroesophageal reflux disease)   Diabetes mellitus type 2, uncomplicated (HCC)  Total Time spent with patient: 20 minutes  Past Psychiatric History: See H&P  Past Medical History:  Past Medical History:  Diagnosis Date   Diabetes mellitus without complication (HCC)    Hypertension    Mentally disabled    Schizophrenia (HCC)    History reviewed. No pertinent surgical history. Family History: History reviewed. No pertinent family history. Family Psychiatric  History: See H&P Social History:  Social History   Substance and Sexual Activity  Alcohol Use No     Social History   Substance and Sexual Activity  Drug Use No    Social History   Socioeconomic History   Marital status: Single    Spouse name: Not on file   Number of children: Not on file   Years of education: Not on file   Highest education level: Not on file  Occupational History   Not on file  Tobacco Use   Smoking status: Never   Smokeless tobacco: Never  Substance and Sexual Activity   Alcohol use: No   Drug use: No   Sexual activity: Not on file  Other Topics Concern   Not on file  Social History Narrative   Not on  file   Social Determinants of Health   Financial Resource Strain: Not on file  Food Insecurity: Not on file  Transportation Needs: Not on file  Physical Activity: Not on file  Stress: Not on file  Social Connections: Not on file   Additional Social History:                         Sleep: Poor  Appetite:  Good  Current Medications: Current Facility-Administered Medications  Medication Dose Route Frequency Provider Last Rate Last Admin   acetaminophen (TYLENOL) tablet 650 mg  650 mg Oral Q6H PRN [name], [name], MD       albuterol (PROVENTIL) (2.5 MG/3ML) 0.083% nebulizer solution 2.5 mg  2.5 mg Inhalation Q6H PRN [name], Jackquline Denmark, MD       ALPRAZolam Prudy Feeler) tablet 2 mg  2 mg Oral QHS Jesse Sans, MD       alum & mag hydroxide-simeth (MAALOX/MYLANTA) 200-200-20 MG/5ML suspension 30 mL  30 mL Oral Q4H PRN [name], [name], MD       amLODipine (NORVASC) tablet 5 mg  5 mg Oral Daily [name], Jackquline Denmark, MD   5 mg at 05/18/21 5400   aspirin EC tablet 81 mg  81 mg Oral Daily [name], Jackquline Denmark, MD   81 mg at 05/18/21 0813   gabapentin (NEURONTIN) capsule 100 mg  100 mg Oral BID [name], Jackquline Denmark,  MD   100 mg at 05/18/21 0813   glipiZIDE (GLUCOTROL) tablet 5 mg  5 mg Oral QAC breakfast Jesse Sans, MD   5 mg at 05/18/21 0813   HYDROcodone-acetaminophen (NORCO) 10-325 MG per tablet 1 tablet  1 tablet Oral Q6H PRN [name], Jackquline Denmark, MD       hydrOXYzine (ATARAX/VISTARIL) tablet 50 mg  50 mg Oral Q6H PRN [name], Jackquline Denmark, MD       magnesium hydroxide (MILK OF MAGNESIA) suspension 30 mL  30 mL Oral Daily PRN [name], Jackquline Denmark, MD       metFORMIN (GLUCOPHAGE) tablet 1,000 mg  1,000 mg Oral BID WC [name], [name], MD   1,000 mg at 05/18/21 0813   paliperidone (INVEGA) 24 hr tablet 3 mg  3 mg Oral QHS [name], [name], MD   3 mg at 05/17/21 2126   pantoprazole (PROTONIX) EC tablet 40 mg  40 mg Oral Daily [name], Jackquline Denmark, MD   40 mg at 05/18/21 0813   pramipexole (MIRAPEX) tablet 0.25 mg  0.25  mg Oral QHS [name], [name], MD   0.25 mg at 05/17/21 2126   pravastatin (PRAVACHOL) tablet 20 mg  20 mg Oral q1800 [name], Jackquline Denmark, MD   20 mg at 05/17/21 2126    Lab Results:  Results for orders placed or performed during the hospital encounter of 05/16/21 (from the past 48 hour(s))  Lipid panel     Status: None   Collection Time: 05/18/21  7:03 AM  Result Value Ref Range   Cholesterol 181 0 - 200 mg/dL   Triglycerides 062 <694 mg/dL   HDL 68 >85 mg/dL   Total CHOL/HDL Ratio 2.7 RATIO   VLDL 29 0 - 40 mg/dL   LDL Cholesterol 84 0 - 99 mg/dL    Comment:        Total Cholesterol/HDL:CHD Risk Coronary Heart Disease Risk Table                     Men   Women  1/2 Average Risk   3.4   3.3  Average Risk       5.0   4.4  2 X Average Risk   9.6   7.1  3 X Average Risk  23.4   11.0        Use the calculated Patient Ratio above and the CHD Risk Table to determine the patient's CHD Risk.        ATP III CLASSIFICATION (LDL):  <100     mg/dL   Optimal  462-703  mg/dL   Near or Above                    Optimal  130-159  mg/dL   Borderline  500-938  mg/dL   High  >182     mg/dL   Very High Performed at [facility], [address] [place], [place] [address]     Blood Alcohol level:  Lab Results  Component Value Date   [facility] <10 05/16/2021    Metabolic Disorder Labs: Lab Results  Component Value Date   HGBA1C 10.9 (H) 05/16/2021   MPG 266 05/16/2021   No results found for: PROLACTIN Lab Results  Component Value Date   CHOL 181 05/18/2021   TRIG 146 05/18/2021   HDL 68 05/18/2021   CHOLHDL 2.7 05/18/2021   VLDL 29 05/18/2021   LDLCALC 84 05/18/2021   LDLCALC 77 05/16/2021  Physical Findings: AIMS: Facial and Oral Movements Muscles of Facial Expression: Minimal Lips and Perioral Area: Moderate Jaw: Moderate Tongue: Minimal,Extremity Movements Upper (arms, wrists, hands, fingers): None, normal Lower (legs, knees, ankles, toes): Mild, Trunk  Movements Neck, shoulders, hips: None, normal, Overall Severity Severity of abnormal movements (highest score from questions above): Moderate Incapacitation due to abnormal movements: None, normal Patient's awareness of abnormal movements (rate only patient's report): No Awareness, Dental Status Current problems with teeth and/or dentures?: No Does patient usually wear dentures?: No  CIWA:    COWS:     Musculoskeletal: Strength & Muscle Tone: within normal limits Gait & Station: normal Patient leans: N/A  Psychiatric Specialty Exam:  Presentation  General Appearance: Casual  Eye Contact:Fair  Speech:Blocked  Speech Volume:Decreased  Handedness:Right   Mood and Affect  Mood:Irritable  Affect:Flat   Thought Process  Thought Processes:Disorganized  Descriptions of Associations:Loose  Orientation:Full (Time, Place and Person)  Thought Content:Paranoid Ideation  History of Schizophrenia/Schizoaffective disorder:Yes  Duration of Psychotic Symptoms:Greater than six months  Hallucinations:Hallucinations: Other (comment) (Patient mostly nonverbal, but appears to be responding to internal stimuli)  Ideas of Reference:Paranoia  Suicidal Thoughts:Suicidal Thoughts: No  Homicidal Thoughts:Homicidal Thoughts: No   Sensorium  Memory:Immediate Poor; Recent Poor; Remote Poor  Judgment:Impaired  Insight:Poor   Executive Functions  Concentration:Poor  Attention Span:Poor  Recall:Poor  Fund of Knowledge:Poor  Language:Poor   Psychomotor Activity  Psychomotor Activity:Psychomotor Activity: Extrapyramidal Side Effects (EPS) Extrapyramidal Side Effects (EPS): Tardive Dyskinesia AIMS Completed?: Yes   Assets  Assets:Financial Resources/Insurance; Housing   Sleep  Sleep:Sleep: Fair Number of Hours of Sleep: 6.75    Physical Exam: Physical Exam ROS Blood pressure 136/65, pulse (!) 103, temperature 98.6 F (37 C), temperature source Oral, resp.  rate 16, height 4' (1.219 m), weight 95.3 kg, SpO2 95 %. Body mass index is 64.11 kg/m.   Treatment Plan Summary: Daily contact with patient to assess and evaluate symptoms and progress in treatment and Medication management   Schizophrenia - Continue Invega 3 mg daily, titrate to effect and plan to transition to [place]   Anxiety - Shift Ativan 2 mg QHS to assist with sleep and avoid daytime sedation   HTN-  controlled - Amlodipine 5 mg daily, ASA 81 mg   Diabetes Mellitus Type 2 - Metformin 1000 mg BID with meals, glipizide 5 mg with breakfast, carb modified diet - CBG with meals and bedtime to monitor    Restless Leg Syndrome - Mirapex 0.25 mg QHS   Chronic pain - Gabapentin 100 mg BID, Norco 10-325 Q6hr PRN for severe pain   GERD - Protonix 40 mg daily   HLD - Pravastatin 20 mg daily   Jesse Sans, MD 05/18/2021, 12:12 PM

## 2021-05-18 NOTE — Progress Notes (Signed)
Patient has been isolative to his room. Has been talking to himself since start of the shift. Clearly preoccupied he has to be prompted multiple times to get him to do anything. Takes time for him to comprehend what is being said to him. He is med compliant and no behavior issue. He is safe on the unit with q 15 minute safety checks. Will continue to monitor.    Cleo Butler-Nicholson, LPN

## 2021-05-18 NOTE — Plan of Care (Signed)
  Problem: Education: Goal: Knowledge of [facility] Education information/materials will improve Outcome: Not Progressing Goal: Emotional status will improve Outcome: Not Progressing Goal: Mental status will improve Outcome: Not Progressing Goal: Verbalization of understanding the information provided will improve Outcome: Not Progressing   Problem: Education: Goal: Will be free of psychotic symptoms Outcome: Not Progressing Goal: Knowledge of the prescribed therapeutic regimen will improve Outcome: Not Progressing

## 2021-05-18 NOTE — BHH Group Notes (Deleted)
LCSW Group Therapy Note  05/18/2021 1:08 PM  Type of Therapy/Topic:  Group Therapy:  Balance in Life  Participation Level:  Did Not Attend  Description of Group:    This group will address the concept of balance and how it feels and looks when one is unbalanced. Patients will be encouraged to process areas in their lives that are out of balance and identify reasons for remaining unbalanced. Facilitators will guide patients in utilizing problem-solving interventions to address and correct the stressor making their life unbalanced. Understanding and applying boundaries will be explored and addressed for obtaining and maintaining a balanced life. Patients will be encouraged to explore ways to assertively make their unbalanced needs known to significant others in their lives, using other group members and facilitator for support and feedback.  Therapeutic Goals: Patient will identify two or more emotions or situations they have that consume much of in their lives. Patient will identify signs/triggers that life has become out of balance:  Patient will identify two ways to set boundaries in order to achieve balance in their lives:  Patient will demonstrate ability to communicate their needs through discussion and/or role plays  Summary of Patient Progress:      Therapeutic Modalities:   Cognitive Behavioral Therapy Solution-Focused Therapy Assertiveness Training  Penni Homans MSW, LCSW 05/18/2021 1:08 PM

## 2021-05-18 NOTE — Progress Notes (Signed)
D: Pt alert and oriented. Pt does not confirm nor deny experiencing any anxiety/depression at this time.Pt does not confirm or deny experiencing any pain at this time. Pt does not confirm or deny experiencing any SI/HI, or AVH at this time.    Pt is very hard of hearing. Pt greeted this Clinical research associate with "I like you" during medication administration. Pt's sister called and was asked if she was the pt's legal guardian and if she had the documentation to fax to [place]. Sister replied that she was and would have to located the paperwork.   Pt's sister called back again later wanting to know when pt is discharging, will she be contacted and given discharge date, etc. Sister informed she would be contacted by CSW prior to discharge. MD made aware of sister's questions and concern. MD spoke with sister yesterday and discussed/address many of the questions and concerns already.  This evening pt was lethargic, unsteady on feet, and breathing heavily upon being woken for CBG and dinner. Pt vital signs taken and are WNL. CBG is also WNL. MD notified of concerns for pt, orders placed and being executed. Also respiratory therapy was contacted and a nebulizer treatment was given. Labs were drawn and xray preformed, results are pending.   A: Scheduled medications administered to pt, per MD orders. Support and encouragement provided. Frequent verbal contact made. Routine safety checks conducted q15 minutes.    R: No adverse drug reactions noted. Pt verbally contracts for safety at this time. Pt complaint with medication. Pt does not initiate interacts with others on the unit. Pt remains safe at this time. Will continue to monitor.

## 2021-05-18 NOTE — Group Note (Signed)
[facility] LCSW Group Therapy Note   Group Date: 05/18/2021 Start Time: 1300 End Time: 1400   Type of Therapy/Topic:  Group Therapy:  Balance in Life  Participation Level:  Did Not Attend   Description of Group:    This group will address the concept of balance and how it feels and looks when one is unbalanced. Patients will be encouraged to process areas in their lives that are out of balance, and identify reasons for remaining unbalanced. Facilitators will guide patients utilizing problem- solving interventions to address and correct the stressor making their life unbalanced. Understanding and applying boundaries will be explored and addressed for obtaining  and maintaining a balanced life. Patients will be encouraged to explore ways to assertively make their unbalanced needs known to significant others in their lives, using other group members and facilitator for support and feedback.  Therapeutic Goals: Patient will identify two or more emotions or situations they have that consume much of in their lives. Patient will identify signs/triggers that life has become out of balance:  Patient will identify two ways to set boundaries in order to achieve balance in their lives:  Patient will demonstrate ability to communicate their needs through discussion and/or role plays  Summary of Patient Progress:  X    Therapeutic Modalities:   Cognitive Behavioral Therapy Solution-Focused Therapy Assertiveness Training   Harden Mo, LCSW

## 2021-05-18 NOTE — BHH Counselor (Signed)
CSW attempted to complete the patient's PSA.  CSW observed the patient to be asleep.  CSW noted the rise and fall of his chest.  CSW attempted to wake the patient verbally, however, was unsuccessful.   CSW will attempt again at a later date.  Penni Homans, MSW, LCSW 05/18/2021 9:41 AM

## 2021-05-19 ENCOUNTER — Inpatient Hospital Stay: Payer: Medicare Other

## 2021-05-19 ENCOUNTER — Encounter: Payer: Self-pay | Admitting: Psychiatry

## 2021-05-19 DIAGNOSIS — R400 Somnolence: Secondary | ICD-10-CM

## 2021-05-19 DIAGNOSIS — R4182 Altered mental status, unspecified: Secondary | ICD-10-CM | POA: Diagnosis present

## 2021-05-19 DIAGNOSIS — E119 Type 2 diabetes mellitus without complications: Secondary | ICD-10-CM

## 2021-05-19 DIAGNOSIS — N1832 Chronic kidney disease, stage 3b: Secondary | ICD-10-CM

## 2021-05-19 LAB — GLUCOSE, CAPILLARY
Glucose-Capillary: 88 mg/dL (ref 70–99)
Glucose-Capillary: 149 mg/dL — ABNORMAL HIGH (ref 70–99)
Glucose-Capillary: 160 mg/dL — ABNORMAL HIGH (ref 70–99)
Glucose-Capillary: 162 mg/dL — ABNORMAL HIGH (ref 70–99)

## 2021-05-19 LAB — BLOOD GAS, ARTERIAL
Acid-Base Excess: 5.2 mmol/L — ABNORMAL HIGH (ref 0.0–2.0)
Bicarbonate: 31 mmol/L — ABNORMAL HIGH (ref 20.0–28.0)
FIO2: 0.21
O2 Saturation: 95.7 %
Patient temperature: 37
pCO2 arterial: 50 mmHg — ABNORMAL HIGH (ref 32.0–48.0)
pH, Arterial: 7.4 (ref 7.350–7.450)
pO2, Arterial: 80 mmHg — ABNORMAL LOW (ref 83.0–108.0)

## 2021-05-19 LAB — SARS CORONAVIRUS 2 (TAT 6-24 HRS): SARS Coronavirus 2: NEGATIVE

## 2021-05-19 MED ORDER — IPRATROPIUM-ALBUTEROL 20-100 MCG/ACT IN AERS
1.0000 | INHALATION_SPRAY | Freq: Four times a day (QID) | RESPIRATORY_TRACT | Status: DC
  Administered 2021-05-19 – 2021-05-23 (×12): 1 via RESPIRATORY_TRACT
  Filled 2021-05-19: qty 4

## 2021-05-19 MED ORDER — ALBUTEROL SULFATE (2.5 MG/3ML) 0.083% IN NEBU
2.5000 mg | INHALATION_SOLUTION | RESPIRATORY_TRACT | Status: DC | PRN
  Filled 2021-05-19: qty 3

## 2021-05-19 MED ORDER — INSULIN ASPART 100 UNIT/ML IJ SOLN
3.0000 [IU] | Freq: Once | INTRAMUSCULAR | Status: AC
  Administered 2021-05-19: 3 [IU] via SUBCUTANEOUS
  Filled 2021-05-19: qty 1

## 2021-05-19 MED ORDER — IPRATROPIUM-ALBUTEROL 0.5-2.5 (3) MG/3ML IN SOLN
3.0000 mL | Freq: Four times a day (QID) | RESPIRATORY_TRACT | Status: DC
  Filled 2021-05-19 (×4): qty 3

## 2021-05-19 NOTE — Progress Notes (Signed)
Delayed entry- patient reevaluated around 11:30 AM. He was able to open his eyes, and sit up in bed with assistance. He was able to use his fork and eat lunch while protecting his airway. Will continue to hold medications for today with exception of Invega 3 mg QHS.

## 2021-05-19 NOTE — Progress Notes (Signed)
Recreation Therapy Notes  INPATIENT RECREATION THERAPY ASSESSMENT  Patient Details Name: Larry Cook MRN: [id number] DOB: [birth year] Today's Date: 05/19/2021       Information Obtained From: Patient (Patient only looking at writer when questions are asked with no response.Patient unable to complete assessment at this time.)  Able to Participate in Assessment/Interview: No  Patient Presentation:    Reason for Admission (Per Patient):    Patient Stressors:    Coping Skills:      Leisure Interests (2+):     Frequency of Recreation/Participation:    Awareness of Community Resources:     [employer]:     Current Use:    If no, Barriers?:    Expressed Interest in [employer] Information:    [place] of Residence:     Patient Main Form of Transportation:    Patient Strengths:     Patient Identified Areas of Improvement:     Patient Goal for Hospitalization:     Current SI (including self-harm):     Current HI:     Current AVH:    Staff Intervention Plan:    Consent to Intern Participation:    Larry Cook 05/19/2021, 8:33 AM

## 2021-05-19 NOTE — Progress Notes (Signed)
Patient more awakened for lunch. Prompted to eat with meal placed in front of him. Completed his meal and drank 4oz of orange juice. Falling back asleep easily. Sitter at bedside. Will cont to monitor.

## 2021-05-19 NOTE — Consult Note (Addendum)
[facility] Medical Consultation  Larry Cook EXH:[id number] DOB: 12-Mar-1961 DOA: 05/16/2021 PCP: Marguarite Arbour, MD   Requesting physician: Dr Larey Brick Date of consultation: 05/19/21 Reason for consultation: Change in mental status  Impression/Recommendations Principal Problem:   AMS (altered mental status) Active Problems:   Schizophrenia (HCC)   Asthma   Hypertension   Dyslipidemia   GERD (gastroesophageal reflux disease)   CKD (chronic kidney disease), stage III (HCC)   Diabetes mellitus type 2, uncomplicated (HCC)    Change in mental status Patient is a 60 year old male who has a history of developmental delay and schizophrenia admitted to the behavioral health unit for management of acute worsening of his known schizophrenia. At baseline he is usually awake and alert and is said to be minimally verbal but today he is very lethargic and according to nursing staff has been very sleepy and weak requiring 2 person assist to get to the bathroom. Unclear etiology for his change in mental status at this time Will obtain a stat CT scan of the head without contrast Hold all sedatives and antipsychotic medications We will get an arterial blood gas Keep patient n.p.o. until more awake and alert    2.  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  History of asthma Continue as needed bronchodilator therapy    4. Morbid Obesity (BMI 64) Complicates overall prognosis and care   I will followup again tomorrow. Please contact me if I can be of assistance in the meanwhile. Thank you for this consultation.  Chief Complaint: Change in mental status Most of the history was obtained from nursing staff, psychiatrist and ER records  HPI:  Patient is a  60 year old male who is developmentally delayed and has a history of schizophrenia, diabetes mellitus with complications of stage IIIa chronic kidney disease, hypertension and dyslipidemia who was brought into the ER by his sister for evaluation of worsening mental health described as increased aggression and poor self-care.  According to his sister patient's behavior acutely worsened over the last 2 weeks where he was noted to become aggressive and had been making inappropriate sexual comments to younger members of the household. At his baseline he is usually minimally verbally responsive but was said to be responding to internal stimuli and able to get around his room as well as to the dining area for his meals. Over the last 24 hours he has been noted to be more sleepy/lethargic and now requires a two-person assist to get to the bathroom. Medical consult was requested for further evaluation. I am unable to do review of systems on this patient due to his mental status changes. Labs show sodium 137, potassium 3.9, chloride 97, bicarb 29, glucose 184, BUN 29, creatinine 1.85 compared to baseline of 1.63, calcium 9.4, alkaline phosphatase 99, albumin 3.5, AST 35, ALT 23, total protein 7.8, white count 7.0, hemoglobin 12.5, hematocrit 38.6, MCV 91.3, RDW 14.9, platelet count 308  Review of Systems:  As per HPI otherwise all other systems reviewed and negative.   Past Medical History:  Diagnosis Date   Diabetes mellitus without complication (HCC)    Hypertension    Mentally disabled    Schizophrenia (HCC)    History reviewed. No pertinent surgical history. Social History:  reports that he has never smoked.  He has never used smokeless tobacco. He reports that he does not drink alcohol and does not use drugs.  Allergies  Allergen Reactions   Acetaminophen-Codeine     Other reaction(s): Unknown   History reviewed. No pertinent family history.  Prior to Admission medications   Medication Sig  Start Date End Date Taking? Authorizing Provider  ALPRAZolam Prudy Feeler) 1 MG tablet Take 1 mg by mouth 3 (three) times daily as needed for sleep. 12/23/16   [provider]  amLODipine (NORVASC) 5 MG tablet Take 5 mg by mouth daily. 01/30/17   [provider]  aspirin EC 81 MG tablet Take 81 mg by mouth daily.    [provider]  gabapentin (NEURONTIN) 100 MG capsule Take 100 mg by mouth 2 (two) times daily. 12/12/16   [provider]  HYDROcodone-acetaminophen (NORCO) 10-325 MG tablet Take 1 tablet by mouth every 6 (six) hours as needed for pain. 01/08/17   [provider]  insulin glargine (LANTUS) 100 UNIT/ML injection Inject 20 Units into the skin at bedtime.    [provider]  lovastatin (MEVACOR) 40 MG tablet Take 40 mg by mouth daily with supper. 01/03/17   [provider]  metFORMIN (GLUCOPHAGE) 1000 MG tablet Take 1,000 mg by mouth 2 (two) times daily. 01/03/17   [provider]  omeprazole (PRILOSEC) 20 MG capsule Take 20 mg by mouth 2 (two) times daily. 01/30/17   [provider]  pramipexole (MIRAPEX) 0.5 MG tablet Take 0.5 mg by mouth 3 (three) times daily. 04/10/21   [provider]  SYMBICORT 160-4.5 MCG/ACT inhaler Inhale 2 puffs into the lungs 2 (two) times daily. 03/27/21   [provider]   Physical Exam: Blood pressure 128/75, pulse 85, temperature 98.6 F (37 C), temperature source Oral, resp. rate 17, height 4' (1.219 m), weight 95.3 kg, SpO2 97 %. Vitals:   05/19/21 0655 05/19/21 0742  BP: (!) 150/87 128/75  Pulse: 92 85  Resp: 17   Temp:    SpO2: 100% 97%    General: Morbidly obese.  Lying in bed.  Spontaneous movement of his extremities.  Spontaneous eye opening Eyes: No pallor Neck: No JVD Cardiovascular: RRR S1,S2 Respiratory: Audible wheezes, air entry in both lung fields Abdomen: Bowel sounds are present, soft, non tender, Skin: Warm and dry Musculoskeletal: Spontaneous  movement of his extremities Psychiatric: Unable to assess Neurologic: Lethargic  Labs on Admission:  Basic Metabolic Panel: Recent Labs  Lab 05/16/21 0151 05/18/21 1816  NA 136 137  K 4.1 3.9  CL 96* 97*  CO2 27 29  GLUCOSE 226* 184*  BUN 22* 29*  CREATININE 1.63* 1.85*  CALCIUM 9.1 9.4   Liver Function Tests: Recent Labs  Lab 05/16/21 0151 05/18/21 1816  AST 29 35  ALT 19 23  ALKPHOS 114 99  BILITOT 0.5 0.7  PROT 7.9 7.8  ALBUMIN 3.6 3.5   No results for input(s): LIPASE, AMYLASE in the last 168 hours. Recent Labs  Lab 05/18/21 1816  AMMONIA 11   CBC: Recent Labs  Lab 05/16/21 0151 05/18/21 1816  WBC 8.6 7.0  NEUTROABS  --  4.4  HGB 11.9* 12.5*  HCT 36.5* 38.6*  MCV 89.0 91.3  PLT 291 308   Cardiac Enzymes: No results for input(s): CKTOTAL, CKMB, CKMBINDEX, TROPONINI in the last 168 hours. BNP: Invalid input(s): POCBNP CBG: Recent Labs  Lab 05/18/21 1612 05/18/21 2229 05/19/21 0648 05/19/21 0743  GLUCAP 109* 88 149* 160*    Radiological  Exams on Admission: DG Chest 2 View  Result Date: 05/18/2021 CLINICAL DATA:  Short of breath, hypertension, diabetes EXAM: CHEST - 2 VIEW COMPARISON:  01/26/2010 FINDINGS: Frontal and lateral views of the chest demonstrate an unremarkable cardiac silhouette. No acute airspace disease, effusion, or pneumothorax. Mild central vascular congestion. No acute bony abnormalities. IMPRESSION: 1. Mild central vascular congestion.  No acute airspace disease. Electronically Signed   By: Sharlet Salina M.D.   On: 05/18/2021 19:09    EKG: Independently reviewed.   Time spent: 60  Larry Cook [facility] Pager [phone]  If 7PM-7AM, please contact night-coverage [email or web address] Password [email or web address] 05/19/2021, 10:14 AM

## 2021-05-19 NOTE — Progress Notes (Signed)
Patient lethargic and difficulty to arouse. Able to be awakened by staff and prompted to eat. VS taken- 147/72, 85, 17, 97%-RA. No s/s of pain noted. Patient attempting to get out of bed but extremely lethargic. Assisted to stand with staff x 2 and noted to be unsteady when standing. Unable to provide care for self. Noted audible wheezing. Respiratory therapy called for breathing treatment. Patient lying in bed with HOB elevated in 65 degrees. 1:1 sitter at bedside for safety. Will continue to monitor.

## 2021-05-19 NOTE — Progress Notes (Signed)
Patient has been asleep since the start of shift. Patient has been awakened several times. Once was to get evening snack and second was to get his blood sugar checked. Each time patient awoke briefly and went right back to bed. He is incoherent and does not appear to understand everything that is being said to him. Blood sugar level was 88 and no coverage was needed. Patient meds have been held for now as he is asleep and it is hard to get him to stay awake. He did not sleep but one hour the previous night and may be genuinely tired. He is for now in bed snoring under no distress. Will continue to monitor him for safety and status changes.   Cleo Butler-Nicholson, LPN

## 2021-05-19 NOTE — Plan of Care (Signed)
I was asked by Dr. Joylene Igo who had consulted on this patient earlier today to reassess due to nursing request.  Briefly, patient is a 60 year old male who is developmentally delayed and has a history of schizophrenia, type 2 diabetes, CKD stage IIIa, hypertension, and hyperlipidemia who was brought into the ER on 9/28 due to concern for worsening behavioral health symptoms.  He was reportedly becoming aggressive and making inappropriate sexual comments to younger members of the household.  Patient is minimally verbal at baseline per report.  Per nursing, yesterday he was very lethargic and slept throughout the day.  Today he has appeared to be generally weak and requiring 2 person assist to ambulate and get to the bathroom.  He was noted to have audible wheezing and was given an albuterol treatment at 1730.  All of his medications have been held since yesterday.  CT head without contrast obtained earlier today did not show any acute finding or change from previous in 2019.  Patient has not urinated today per nursing.  On exam, patient is lying in bed in the dorsal recumbent position.  He is obese.  He is not verbalizing but awake, alert, and intermittently following commands.  He has some expiratory wheezing bilaterally without any sign of respiratory distress or accessory muscle use.  Abdomen is obese and nontender.  Cardiac exam shows regular rate and rhythm without murmur.  He is moving all of his extremities equally and strength is intact throughout.  He is having difficulty getting out of bed due to positioning but is able to stand up with my assistance and he was walked to sit down in a chair a few feet away.  Assessment/plan: Patient appears to be more awake and interactive compared to her earlier report although seems deconditioned.  He does have wheezing on exam but does not appear to have a decompensated asthma exacerbation.  He has been hemodynamically stable and saturating well on room air.  Labs  are largely reassuring.  CT head negative.  Currently there is no indication for inpatient medical management. -Agree with scheduled DuoNebs as ordered by respiratory, continue albuterol as needed for wheezing/shortness of breath -Obtain urinalysis to assess for UTI -If unable to urinate on her own, obtain bladder scan and in and out cath if retaining urine -Repeat BMP in a.m. -Agree with holding gabapentin and Norco, low threshold to restart chronic home Xanax to avoid withdrawal -Hold metformin for now, can continue all other medications as ordered  Darreld Mclean, MD [facility]

## 2021-05-19 NOTE — Progress Notes (Signed)
1:1 Patient Hourly Rounding   10:00- Patient laying in bed sleeping and drowsy. Opens eyes occasionally and waves.  1:1 sitter present @ bedside. Cont to monitor.     14:00- Patient laying in bed sleeping and drowsy. Opens eyes occasionally spontaneously and responds to painful stimuli.  1:1 sitter present @ bedside. Cont to monitor.     18:00- Patient sitting in chair. MD assessing patient due to status change. 1:1 sitter present @ bedside. Cont to monitor.

## 2021-05-19 NOTE — [facility] Suicide Risk Assessment (Signed)
[facility] INPATIENT:  Family/Significant Other Suicide Prevention Education  Suicide Prevention Education:  Patient Refusal for Family/Significant Other Suicide Prevention Education: The patient Larry Cook has refused to provide written consent for family/significant other to be provided Family/Significant Other Suicide Prevention Education during admission and/or prior to discharge.  Physician notified.  Patient remains nonverbal at this time and unable to provide permission for CSW to contact family or collaterals at this time.  CSW will continue to attempt.  Harden Mo 05/19/2021, 11:45 AM

## 2021-05-19 NOTE — Progress Notes (Signed)
Recreation Therapy Notes   Date: 05/19/2021  Time: 10:00 am   Location: Craft room   Behavioral response: N/A   Intervention Topic: Anger Management    Discussion/Intervention: Patient did not attend group.   Clinical Observations/Feedback:  Patient did not attend group.   Avi Archuleta LRT/CTRS        Aarini Slee 05/19/2021 11:55 AM

## 2021-05-19 NOTE — Progress Notes (Addendum)
Patient alert and oriented x 2 no distress noted, affect is flat, he forwards very little, he was noted isolated in his room, lying in bed, on 1:1 for safety , he appears receptive to staff. Patient by nodding his head denies SI/HI/AVH,was offered emotional support. 15 minutes safety checks maintained will continue to monitor.

## 2021-05-19 NOTE — Progress Notes (Signed)
Recreation Therapy Notes  INPATIENT RECREATION TR PLAN  Patient Details Name: Larry Cook MRN: [id number] DOB: [birth year] Today's Date: 05/19/2021  Rec Therapy Plan Is patient appropriate for Therapeutic Recreation?: Yes Treatment times per week: at least 3 Estimated Length of Stay: 5-7 days TR Treatment/Interventions: Group participation (Comment)  Discharge Criteria Pt will be discharged from therapy if:: Discharged Treatment plan/goals/alternatives discussed and agreed upon by:: Patient/family  Discharge Summary     Adrijana Haros 05/19/2021, 8:34 AM

## 2021-05-19 NOTE — Group Note (Signed)
[facility] LCSW Group Therapy Note   Group Date: 05/19/2021 Start Time: 1314 End Time: 1420  Type of Therapy and Topic:  Group Therapy:  Feelings around Relapse and Recovery  Participation Level:  Did Not Attend   Description of Group:    Patients in this group will discuss emotions they experience before and after a relapse. They will process how experiencing these feelings, or avoidance of experiencing them, relates to having a relapse. Facilitator will guide patients to explore emotions they have related to recovery. Patients will be encouraged to process which emotions are more powerful. They will be guided to discuss the emotional reaction significant others in their lives may have to patients' relapse or recovery. Patients will be assisted in exploring ways to respond to the emotions of others without this contributing to a relapse.  Therapeutic Goals: Patient will identify two or more emotions that lead to relapse for them:  Patient will identify two emotions that result when they relapse:  Patient will identify two emotions related to recovery:  Patient will demonstrate ability to communicate their needs through discussion and/or role plays.   Summary of Patient Progress: X   Therapeutic Modalities:   Cognitive Behavioral Therapy Solution-Focused Therapy Assertiveness Training Relapse Prevention Therapy   Aubrynn Katona R Westlee Devita, LCSW 

## 2021-05-19 NOTE — Progress Notes (Signed)
[facility] MD Progress Note  05/19/2021 10:42 AM Larry Cook  MRN:  [id number]  CC: Follow-up for schizophrenia  Subjective:  60 year old male brought to emergency room by his sister due to worsening mental health, increased aggression, and poor self care. Yesterday afternoon patient with wheezing and received duoneb treatment. He has become increasingly lethargic with altered mental status. He is now requiring 2 person assist to get to the restroom, and is unsteady on his feet. Yesterday evening CMP drawn and grossly normal. Baseline CKD stage 3 with minal increase in BUN. CBC without white count, ammonia level within normal limits, covid and flu negative. Hospitalist team consulted. Will hold all medications, CT head without contrast, arterial blood gas, CBG Q4 hours. On my exam patient was able to be aroused after moving his arm. He briefly opened his eyes and waved. He immediately fell back asleep.   Principal Problem: AMS (altered mental status) Diagnosis: Principal Problem:   AMS (altered mental status) Active Problems:   Schizophrenia (HCC)   Asthma   Hypertension   Dyslipidemia   GERD (gastroesophageal reflux disease)   CKD (chronic kidney disease), stage III (HCC)   Diabetes mellitus type 2, uncomplicated (HCC)  Total Time spent with patient: 20 minutes  Past Psychiatric History: See H&P  Past Medical History:  Past Medical History:  Diagnosis Date   Diabetes mellitus without complication (HCC)    Hypertension    Mentally disabled    Schizophrenia (HCC)    History reviewed. No pertinent surgical history. Family History: History reviewed. No pertinent family history. Family Psychiatric  History: See H&P Social History:  Social History   Substance and Sexual Activity  Alcohol Use No     Social History   Substance and Sexual Activity  Drug Use No    Social History   Socioeconomic History   Marital status: Single    Spouse name: Not on file   Number of children: Not  on file   Years of education: Not on file   Highest education level: Not on file  Occupational History   Not on file  Tobacco Use   Smoking status: Never   Smokeless tobacco: Never  Substance and Sexual Activity   Alcohol use: No   Drug use: No   Sexual activity: Not on file  Other Topics Concern   Not on file  Social History Narrative   Not on file   Social Determinants of Health   Financial Resource Strain: Not on file  Food Insecurity: Not on file  Transportation Needs: Not on file  Physical Activity: Not on file  Stress: Not on file  Social Connections: Not on file   Additional Social History:                         Sleep: Poor  Appetite:  Good  Current Medications: Current Facility-Administered Medications  Medication Dose Route Frequency Provider Last Rate Last Admin   acetaminophen (TYLENOL) tablet 650 mg  650 mg Oral Q6H PRN [name], [name], MD       albuterol (PROVENTIL) (2.5 MG/3ML) 0.083% nebulizer solution 2.5 mg  2.5 mg Inhalation Q6H PRN [name], [name], MD   2.5 mg at 05/18/21 1731   albuterol (VENTOLIN HFA) 108 (90 Base) MCG/ACT inhaler 2 puff  2 puff Inhalation Q4H PRN Jesse Sans, MD       alum & mag hydroxide-simeth (MAALOX/MYLANTA) 200-200-20 MG/5ML suspension 30 mL  30 mL Oral Q4H PRN [name],  Jackquline Denmark, MD       amLODipine (NORVASC) tablet 5 mg  5 mg Oral Daily [name], Jackquline Denmark, MD   5 mg at 05/18/21 9518   aspirin EC tablet 81 mg  81 mg Oral Daily [name], Jackquline Denmark, MD   81 mg at 05/18/21 0813   glipiZIDE (GLUCOTROL) tablet 5 mg  5 mg Oral QAC breakfast Jesse Sans, MD   5 mg at 05/18/21 0813   hydrOXYzine (ATARAX/VISTARIL) tablet 50 mg  50 mg Oral Q6H PRN [name], Jackquline Denmark, MD       magnesium hydroxide (MILK OF MAGNESIA) suspension 30 mL  30 mL Oral Daily PRN [name], Jackquline Denmark, MD       metFORMIN (GLUCOPHAGE) tablet 1,000 mg  1,000 mg Oral BID WC [name], [name], MD   1,000 mg at 05/18/21 1631   paliperidone (INVEGA) 24 hr tablet 3 mg   3 mg Oral QHS [name], [name], MD   3 mg at 05/18/21 2205   pantoprazole (PROTONIX) EC tablet 40 mg  40 mg Oral Daily [name], Jackquline Denmark, MD   40 mg at 05/18/21 0813   pramipexole (MIRAPEX) tablet 0.25 mg  0.25 mg Oral QHS [name], [name], MD   0.25 mg at 05/18/21 2204   pravastatin (PRAVACHOL) tablet 20 mg  20 mg Oral q1800 [name], Jackquline Denmark, MD   20 mg at 05/18/21 2205    Lab Results:  Results for orders placed or performed during the hospital encounter of 05/16/21 (from the past 48 hour(s))  Lipid panel     Status: None   Collection Time: 05/18/21  7:03 AM  Result Value Ref Range   Cholesterol 181 0 - 200 mg/dL   Triglycerides 841 <660 mg/dL   HDL 68 >63 mg/dL   Total CHOL/HDL Ratio 2.7 RATIO   VLDL 29 0 - 40 mg/dL   LDL Cholesterol 84 0 - 99 mg/dL    Comment:        Total Cholesterol/HDL:CHD Risk Coronary Heart Disease Risk Table                     Men   Women  1/2 Average Risk   3.4   3.3  Average Risk       5.0   4.4  2 X Average Risk   9.6   7.1  3 X Average Risk  23.4   11.0        Use the calculated Patient Ratio above and the CHD Risk Table to determine the patient's CHD Risk.        ATP III CLASSIFICATION (LDL):  <100     mg/dL   Optimal  016-010  mg/dL   Near or Above                    Optimal  130-159  mg/dL   Borderline  932-355  mg/dL   High  >732     mg/dL   Very High Performed at [facility], [address] [place], [place] [address]   Glucose, capillary     Status: Abnormal   Collection Time: 05/18/21  4:12 PM  Result Value Ref Range   Glucose-Capillary 109 (H) 70 - 99 mg/dL    Comment: Glucose reference range applies only to samples taken after fasting for at least 8 hours.  CBC with Differential/Platelet     Status: Abnormal   Collection Time: 05/18/21  6:16 PM  Result  Value Ref Range   WBC 7.0 4.0 - 10.5 K/uL   RBC 4.23 4.22 - 5.81 MIL/uL   Hemoglobin 12.5 (L) 13.0 - 17.0 g/dL   HCT 16.1 (L) 09.6 - 04.5 %   MCV 91.3 80.0 - 100.0 fL    MCH 29.6 26.0 - 34.0 pg   MCHC 32.4 30.0 - 36.0 g/dL   RDW 40.9 81.1 - 91.4 %   Platelets 308 150 - 400 K/uL   nRBC 0.0 0.0 - 0.2 %   Neutrophils Relative % 63 %   Neutro Abs 4.4 1.7 - 7.7 K/uL   Lymphocytes Relative 26 %   Lymphs Abs 1.8 0.7 - 4.0 K/uL   Monocytes Relative 9 %   Monocytes Absolute 0.6 0.1 - 1.0 K/uL   Eosinophils Relative 2 %   Eosinophils Absolute 0.2 0.0 - 0.5 K/uL   Basophils Relative 0 %   Basophils Absolute 0.0 0.0 - 0.1 K/uL   Immature Granulocytes 0 %   Abs Immature Granulocytes 0.02 0.00 - 0.07 K/uL    Comment: Performed at [facility], [address] [place], [place] [address]  Comprehensive metabolic panel     Status: Abnormal   Collection Time: 05/18/21  6:16 PM  Result Value Ref Range   Sodium 137 135 - 145 mmol/L   Potassium 3.9 3.5 - 5.1 mmol/L   Chloride 97 (L) 98 - 111 mmol/L   CO2 29 22 - 32 mmol/L   Glucose, Bld 184 (H) 70 - 99 mg/dL    Comment: Glucose reference range applies only to samples taken after fasting for at least 8 hours.   BUN 29 (H) 6 - 20 mg/dL   Creatinine, Ser 6.21 (H) 0.61 - 1.24 mg/dL   Calcium 9.4 8.9 - 30.8 mg/dL   Total Protein 7.8 6.5 - 8.1 g/dL   Albumin 3.5 3.5 - 5.0 g/dL   AST 35 15 - 41 U/L   ALT 23 0 - 44 U/L   Alkaline Phosphatase 99 38 - 126 U/L   Total Bilirubin 0.7 0.3 - 1.2 mg/dL   GFR, Estimated 41 (L) >60 mL/min    Comment: (NOTE) Calculated using the CKD-EPI Creatinine Equation (2021)    Anion gap 11 5 - 15    Comment: Performed at [facility], [address] [place], [place] [address]  Ammonia     Status: None   Collection Time: 05/18/21  6:16 PM  Result Value Ref Range   Ammonia 11 9 - 35 umol/L    Comment: Performed at [facility], [address] [place], [place] [address]  SARS CORONAVIRUS 2 (TAT 6-24 HRS) Nasopharyngeal Nasopharyngeal Swab     Status: None   Collection Time: 05/18/21  6:44 PM   Specimen: Nasopharyngeal Swab  Result Value Ref Range   SARS  Coronavirus 2 NEGATIVE NEGATIVE    Comment: (NOTE) SARS-CoV-2 target nucleic acids are NOT DETECTED.  The SARS-CoV-2 RNA is generally detectable in upper and lower respiratory specimens during the acute phase of infection. Negative results do not preclude SARS-CoV-2 infection, do not rule out co-infections with other pathogens, and should not be used as the sole basis for treatment or other patient management decisions. Negative results must be combined with clinical observations, patient history, and epidemiological information. The expected result is Negative.  Fact Sheet for Patients: HairSlick.no  Fact Sheet for Healthcare Providers: quierodirigir.com  This test is not yet approved or cleared by the [place] FDA and  has been  authorized for detection and/or diagnosis of SARS-CoV-2 by FDA under an Emergency Use Authorization (EUA). This EUA will remain  in effect (meaning this test can be used) for the duration of the COVID-19 declaration under Se ction 564(b)(1) of the Act, 21 U.S.C. section 360bbb-3(b)(1), unless the authorization is terminated or revoked sooner.  Performed at [facility], [address], [place], [place] [address]   Glucose, capillary     Status: None   Collection Time: 05/18/21 10:29 PM  Result Value Ref Range   Glucose-Capillary 88 70 - 99 mg/dL    Comment: Glucose reference range applies only to samples taken after fasting for at least 8 hours.  Glucose, capillary     Status: Abnormal   Collection Time: 05/19/21  6:48 AM  Result Value Ref Range   Glucose-Capillary 149 (H) 70 - 99 mg/dL    Comment: Glucose reference range applies only to samples taken after fasting for at least 8 hours.  Glucose, capillary     Status: Abnormal   Collection Time: 05/19/21  7:43 AM  Result Value Ref Range   Glucose-Capillary 160 (H) 70 - 99 mg/dL    Comment: Glucose reference range applies only to  samples taken after fasting for at least 8 hours.    Blood Alcohol level:  Lab Results  Component Value Date   ETH <10 05/16/2021    Metabolic Disorder Labs: Lab Results  Component Value Date   HGBA1C 10.9 (H) 05/16/2021   MPG 266 05/16/2021   No results found for: PROLACTIN Lab Results  Component Value Date   CHOL 181 05/18/2021   TRIG 146 05/18/2021   HDL 68 05/18/2021   CHOLHDL 2.7 05/18/2021   VLDL 29 05/18/2021   LDLCALC 84 05/18/2021   LDLCALC 77 05/16/2021    Physical Findings: AIMS: Facial and Oral Movements Muscles of Facial Expression: Minimal Lips and Perioral Area: Moderate Jaw: Moderate Tongue: Minimal,Extremity Movements Upper (arms, wrists, hands, fingers): None, normal Lower (legs, knees, ankles, toes): Mild, Trunk Movements Neck, shoulders, hips: None, normal, Overall Severity Severity of abnormal movements (highest score from questions above): Moderate Incapacitation due to abnormal movements: None, normal Patient's awareness of abnormal movements (rate only patient's report): No Awareness, Dental Status Current problems with teeth and/or dentures?: No Does patient usually wear dentures?: No  CIWA:    COWS:     Musculoskeletal: Strength & Muscle Tone: within normal limits Gait & Station: normal Patient leans: N/A  Psychiatric Specialty Exam:  Presentation  General Appearance: Casual  Eye Contact:Poor  Speech:Mute Speech Volume:N/A Handedness:Right   Mood and Affect  Mood:Irritable  Affect:Flat   Thought Process  Thought Processes:Disorganized  Descriptions of Associations:Loose  Orientation:Full (Time, Place and Person)  Thought Content:Paranoid Ideation  History of Schizophrenia/Schizoaffective disorder:Yes  Duration of Psychotic Symptoms:Greater than six months  Hallucinations:Unknown  Ideas of Reference:Paranoia  Suicidal Thoughts:Unknown  Homicidal Thoughts:Unknown   Sensorium  Memory:Immediate Poor; Recent  Poor; Remote Poor  Judgment:Impaired  Insight:Poor   Executive Functions  Concentration:Poor  Attention Span:Poor  Recall:Poor  Fund of Knowledge:Poor  Language:Poor   Psychomotor Activity  Psychomotor Activity:decreased   Assets  Assets:Financial Resources/Insurance; Housing   Sleep  Sleep:No data recorded    Physical Exam: Physical Exam ROS Blood pressure 128/75, pulse 85, temperature 98.6 F (37 C), temperature source Oral, resp. rate 17, height 4' (1.219 m), weight 95.3 kg, SpO2 97 %. Body mass index is 64.11 kg/m.   Treatment Plan Summary: Daily contact with patient to assess and  evaluate symptoms and progress in treatment and Medication management   AMS- new problem - head CT, arterial blood gas, CBG q4 hours, NPO until more awake. Hold all medications.   Schizophrenia - Hold Invega 3 mg daily, titrate to effect and plan to transition to [place]   Anxiety - Hold Ativan, and decrease to 1 mg QHS once more alert   HTN-  controlled -  Hold Amlodipine 5 mg daily, ASA 81 mg   Diabetes Mellitus Type 2 - Hold Metformin 1000 mg BID with meals, glipizide 5 mg with breakfast, carb modified diet - CBG with meals and bedtime to monitor    Restless Leg Syndrome - Hold Mirapex 0.25 mg QHS   Chronic pain - Hold Gabapentin 100 mg BID, Norco 10-325 Q6hr PRN for severe pain   GERD - Hold Protonix 40 mg daily   HLD - Hold Pravastatin 20 mg daily   Jesse Sans, MD 05/19/2021, 10:42 AM

## 2021-05-19 NOTE — Progress Notes (Addendum)
Pt was found by MHT sitting on the side of the bed with pants down. RN's x2  to bedside. Assessment completed. Patient noted to be extremely lethargic with minimal response to painful stimuli . Assisted by 2 staff with ADLs (dressing and toileting) and he barely opened his eyes. Placed back in the bed x2 staff. Vital signs were 120/75, 85, 17, 97% on room air and blood sugar-160.  RN initiated Comptroller for safety purposes. MD notified. Verbal order for 1:1 Sitter for safety.

## 2021-05-19 NOTE — BHH Counselor (Signed)
Adult Comprehensive Assessment  Patient ID: Larry Cook, male   DOB: 1960/08/31, 60 y.o.   MRN: [id number]  Information Source: Information source: Patient (CSW attempted to complete the assessment with the patient.  Patient has not spoken to staff this admission.  Assessment completed with chart review.)  Current Stressors:  Patient states their primary concerns and needs for treatment are:: Pt declined or unable  to participate.  Consult note indicates that patient has decompensated in the last 2 weeks, "throwing a chair, saying inappropriate sexual comments to children, refusing bathe" Patient states their goals for this hospitilization and ongoing recovery are:: Pt declined or unable  to participate. Educational / Learning stressors: Pt declined or unable  to participate. Employment / Job issues: Pt declined or unable  to participate. Family Relationships: Pt declined or unable  to participate. Financial / Lack of resources (include bankruptcy): Pt declined or unable  to participate. Housing / Lack of housing: Pt declined or unable  to participate. Physical health (include injuries & life threatening diseases): Pt declined or unable  to participate. Social relationships: Pt declined or unable  to participate. Substance abuse: Pt declined or unable  to participate. Bereavement / Loss: Pt declined or unable  to participate.  Living/Environment/Situation:  Living Arrangements: Other relatives Living conditions (as described by patient or guardian): Pt declined or unable  to participate. Who else lives in the home?: Per chart review it appears patient lives with his sister. How long has patient lived in current situation?: Pt declined or unable  to participate.  Family History:  Marital status:  (Pt declined or unable  to participate.)  Childhood History:  By whom was/is the patient raised?:  (Pt declined or unable  to participate.) Additional childhood history information: Pt declined  or unable  to participate. Description of patient's relationship with caregiver when they were a child: Pt declined or unable  to participate. Patient's description of current relationship with people who raised him/her: Pt declined or unable  to participate. How were you disciplined when you got in trouble as a child/adolescent?: Pt declined or unable  to participate. Does patient have siblings?:  (Chart indicates that patient has a sister.) Did patient suffer any verbal/emotional/physical/sexual abuse as a child?:  (Pt declined or unable  to participate.) Did patient suffer from severe childhood neglect?:  (Pt declined or unable  to participate.) Has patient ever been sexually abused/assaulted/raped as an adolescent or adult?:  (Pt declined or unable  to participate.) Was the patient ever a victim of a crime or a disaster?:  (Pt declined or unable  to participate.) Witnessed domestic violence?:  (Pt declined or unable  to participate.) Has patient been affected by domestic violence as an adult?:  (Pt declined or unable  to participate.)  Education:  Highest grade of school patient has completed: Pt declined or unable  to participate. Currently a student?:  (Pt declined or unable  to participate.) Learning disability?:  (Pt declined or unable  to participate.)  Employment/Work Situation:   Employment Situation:  (Pt declined or unable  to participate.) What is the Longest Time Patient has Held a Job?: Pt declined or unable  to participate. Where was the Patient Employed at that Time?: Pt declined or unable  to participate. Has Patient ever Been in the Military?:  (Pt declined or unable  to participate.)  Financial Resources:   Financial resources:  (Pt declined or unable  to participate.) Does patient have a representative payee or guardian?:  (Pt  declined or unable  to participate.)  Alcohol/Substance Abuse:   What has been your use of drugs/alcohol within the last 12 months?:  (Pt  declined or unable  to participate.) Alcohol/Substance Abuse Treatment Hx:  (Pt declined or unable  to participate.) If yes, describe treatment:  (Pt declined or unable  to participate.) Has alcohol/substance abuse ever caused legal problems?:  (Pt declined or unable  to participate.)  Social Support System:   Patient's Community Support System:  (Pt declined or unable  to participate.) Describe Community Support System: Pt declined or unable  to participate. Type of faith/religion: Pt declined or unable  to participate. How does patient's faith help to cope with current illness?: Pt declined or unable  to participate.  Leisure/Recreation:   Do You Have Hobbies?:  (Pt declined or unable  to participate.)  Strengths/Needs:   What is the patient's perception of their strengths?: Pt declined or unable  to participate. Patient states they can use these personal strengths during their treatment to contribute to their recovery: Pt declined or unable  to participate. Patient states these barriers may affect/interfere with their treatment: Pt declined or unable  to participate. Patient states these barriers may affect their return to the community: Pt declined or unable  to participate.  Discharge Plan:   Currently receiving community mental health services:  (Pt declined or unable  to participate.) Patient states concerns and preferences for aftercare planning are: Pt declined or unable  to participate. Patient states they will know when they are safe and ready for discharge when: Pt declined or unable  to participate. Does patient have access to transportation?:  (Pt declined or unable  to participate.) Does patient have financial barriers related to discharge medications?:  (Pt declined or unable  to participate.) Patient description of barriers related to discharge medications: Pt declined or unable  to participate.  Summary/Recommendations:   Summary and Recommendations (to be completed by  the evaluator): Patient [facility] a 60 [facility] [place] [facility] [place] [place] [facility]  Patient has not participated in his admission at this time and not engaged in conversation with the staff.  Information has been gathered from collateral reports and chart review.  Chart indicates that the patient has decompensated in the home over the last two weeks.  Reports state that patient has "swung a chair, said inappropriate sexual comments to children, refusing to bathe".  Chart indicates that patient lives in the home with his sister.  Recommendations include: crisis stabilization, therapeutic milieu, encourage group attendance and participation, medication management for detox/mood stabilization and development of comprehensive mental wellness/sobriety plan.  Harden Mo. 05/19/2021

## 2021-05-20 DIAGNOSIS — F209 Schizophrenia, unspecified: Secondary | ICD-10-CM

## 2021-05-20 DIAGNOSIS — R4182 Altered mental status, unspecified: Secondary | ICD-10-CM

## 2021-05-20 DIAGNOSIS — F32A Depression, unspecified: Secondary | ICD-10-CM

## 2021-05-20 LAB — BASIC METABOLIC PANEL
Anion gap: 9 (ref 5–15)
BUN: 29 mg/dL — ABNORMAL HIGH (ref 6–20)
CO2: 32 mmol/L (ref 22–32)
Calcium: 9.6 mg/dL (ref 8.9–10.3)
Chloride: 96 mmol/L — ABNORMAL LOW (ref 98–111)
Creatinine, Ser: 1.61 mg/dL — ABNORMAL HIGH (ref 0.61–1.24)
GFR, Estimated: 49 mL/min — ABNORMAL LOW (ref >60)
Glucose, Bld: 145 mg/dL — ABNORMAL HIGH (ref 70–99)
Potassium: 4.3 mmol/L (ref 3.5–5.1)
Sodium: 137 mmol/L (ref 135–145)

## 2021-05-20 MED ORDER — INSULIN ASPART 100 UNIT/ML IJ SOLN
0.0000 [IU] | Freq: Three times a day (TID) | INTRAMUSCULAR | Status: DC
  Administered 2021-05-20: 15 [IU] via SUBCUTANEOUS
  Administered 2021-05-21: 8 [IU] via SUBCUTANEOUS
  Administered 2021-05-21: 2 [IU] via SUBCUTANEOUS
  Administered 2021-05-22: 3 [IU] via SUBCUTANEOUS
  Administered 2021-05-22: 5 [IU] via SUBCUTANEOUS
  Administered 2021-05-22: 8 [IU] via SUBCUTANEOUS
  Administered 2021-05-23: 3 [IU] via SUBCUTANEOUS
  Administered 2021-05-23: 5 [IU] via SUBCUTANEOUS
  Filled 2021-05-20 (×5): qty 1

## 2021-05-20 MED ORDER — INSULIN ASPART 100 UNIT/ML IJ SOLN: 0.0000 [IU] | Freq: Three times a day (TID) | INTRAMUSCULAR | Status: DC

## 2021-05-20 NOTE — Progress Notes (Signed)
Patient's blood sugar this evening was 274. MD on call notified d/t no coverage for night shift order received for 3 units novolog once. Will continue to monitor closely.

## 2021-05-20 NOTE — Progress Notes (Addendum)
[facility] MD Progress Note  05/20/2021 10:26 AM Larry Cook  MRN:  [id number] Subjective:  60 year old male brought to emergency room by his sister due to worsening mental health, increased aggression, and poor self care.  Nursing Report : Patient lethargic and difficulty to arouse. Able to be awakened by staff and prompted to eat. VS taken- 147/72, 85, 17, 97%-RA. No s/s of pain noted. Patient attempting to get out of bed but extremely lethargic. Assisted to stand with staff x 2 and noted to be unsteady when standing. Unable to provide care for self. Noted audible wheezing. Respiratory therapy called for breathing treatment. Patient lying in bed with HOB elevated in 65 degrees. 1:1 sitter at bedside for safety.  Patient was seen in his room this morning; He is more alert today, is eating well and smiling. He is pretty much non verbal and just smiles when asked questions. He slept well last night and has been taking his meds.   Principal Problem: AMS (altered mental status) Diagnosis: Principal Problem:   AMS (altered mental status) Active Problems:   Schizophrenia (HCC)   Asthma   Hypertension   Dyslipidemia   GERD (gastroesophageal reflux disease)   CKD (chronic kidney disease), stage III (HCC)   Diabetes mellitus type 2, uncomplicated (HCC)  Total Time spent with patient: 20 minutes  Past Psychiatric History: Schizophrenia Intellectual disability?  Past Medical History:  Past Medical History:  Diagnosis Date   Diabetes mellitus without complication (HCC)    Hypertension    Mentally disabled    Schizophrenia (HCC)    History reviewed. No pertinent surgical history. Family History: History reviewed. No pertinent family history. Family Psychiatric  History: None reported Social History:  Social History   Substance and Sexual Activity  Alcohol Use No     Social History   Substance and Sexual Activity  Drug Use No    Social History   Socioeconomic History   Marital status:  Single    Spouse name: Not on file   Number of children: Not on file   Years of education: Not on file   Highest education level: Not on file  Occupational History   Not on file  Tobacco Use   Smoking status: Never   Smokeless tobacco: Never  Substance and Sexual Activity   Alcohol use: No   Drug use: No   Sexual activity: Not on file  Other Topics Concern   Not on file  Social History Narrative   Not on file   Social Determinants of Health   Financial Resource Strain: Not on file  Food Insecurity: Not on file  Transportation Needs: Not on file  Physical Activity: Not on file  Stress: Not on file  Social Connections: Not on file   Additional Social History:                         Sleep: Fair  Appetite:  Fair  Current Medications: Current Facility-Administered Medications  Medication Dose Route Frequency Provider Last Rate Last Admin   acetaminophen (TYLENOL) tablet 650 mg  650 mg Oral Q6H PRN [name], [name], MD       albuterol (PROVENTIL) (2.5 MG/3ML) 0.083% nebulizer solution 2.5 mg  2.5 mg Inhalation Q2H PRN Charlsie Quest, MD       albuterol (VENTOLIN HFA) 108 (90 Base) MCG/ACT inhaler 2 puff  2 puff Inhalation Q4H PRN Jesse Sans, MD       alum & mag hydroxide-simeth (  MAALOX/MYLANTA) 200-200-20 MG/5ML suspension 30 mL  30 mL Oral Q4H PRN [name], [name], MD       amLODipine (NORVASC) tablet 5 mg  5 mg Oral Daily [name], [name], MD   5 mg at 05/20/21 1610   aspirin EC tablet 81 mg  81 mg Oral Daily [name], Jackquline Denmark, MD   81 mg at 05/20/21 0811   glipiZIDE (GLUCOTROL) tablet 5 mg  5 mg Oral QAC breakfast Jesse Sans, MD   5 mg at 05/20/21 9604   hydrOXYzine (ATARAX/VISTARIL) tablet 50 mg  50 mg Oral Q6H PRN [name], Jackquline Denmark, MD       Ipratropium-Albuterol (COMBIVENT) respimat 1 puff  1 puff Inhalation Q6H Jesse Sans, MD   1 puff at 05/19/21 2300   magnesium hydroxide (MILK OF MAGNESIA) suspension 30 mL  30 mL Oral Daily PRN [name], Jackquline Denmark,  MD       paliperidone (INVEGA) 24 hr tablet 3 mg  3 mg Oral QHS [name], [name], MD   3 mg at 05/19/21 2204   pantoprazole (PROTONIX) EC tablet 40 mg  40 mg Oral Daily [name], [name], MD   40 mg at 05/20/21 5409   pramipexole (MIRAPEX) tablet 0.25 mg  0.25 mg Oral QHS [name], [name], MD   0.25 mg at 05/19/21 2205   pravastatin (PRAVACHOL) tablet 20 mg  20 mg Oral q1800 [name], Jackquline Denmark, MD   20 mg at 05/19/21 2205    Lab Results:  Results for orders placed or performed during the hospital encounter of 05/16/21 (from the past 48 hour(s))  Glucose, capillary     Status: Abnormal   Collection Time: 05/18/21  4:12 PM  Result Value Ref Range   Glucose-Capillary 109 (H) 70 - 99 mg/dL    Comment: Glucose reference range applies only to samples taken after fasting for at least 8 hours.  CBC with Differential/Platelet     Status: Abnormal   Collection Time: 05/18/21  6:16 PM  Result Value Ref Range   WBC 7.0 4.0 - 10.5 K/uL   RBC 4.23 4.22 - 5.81 MIL/uL   Hemoglobin 12.5 (L) 13.0 - 17.0 g/dL   HCT 81.1 (L) 91.4 - 78.2 %   MCV 91.3 80.0 - 100.0 fL   MCH 29.6 26.0 - 34.0 pg   MCHC 32.4 30.0 - 36.0 g/dL   RDW 95.6 21.3 - 08.6 %   Platelets 308 150 - 400 K/uL   nRBC 0.0 0.0 - 0.2 %   Neutrophils Relative % 63 %   Neutro Abs 4.4 1.7 - 7.7 K/uL   Lymphocytes Relative 26 %   Lymphs Abs 1.8 0.7 - 4.0 K/uL   Monocytes Relative 9 %   Monocytes Absolute 0.6 0.1 - 1.0 K/uL   Eosinophils Relative 2 %   Eosinophils Absolute 0.2 0.0 - 0.5 K/uL   Basophils Relative 0 %   Basophils Absolute 0.0 0.0 - 0.1 K/uL   Immature Granulocytes 0 %   Abs Immature Granulocytes 0.02 0.00 - 0.07 K/uL    Comment: Performed at [facility], [address], [place], [place] [address]  Comprehensive metabolic panel     Status: Abnormal   Collection Time: 05/18/21  6:16 PM  Result Value Ref Range   Sodium 137 135 - 145 mmol/L   Potassium 3.9 3.5 - 5.1 mmol/L   Chloride 97 (L) 98 - 111 mmol/L   CO2 29 22 -  32 mmol/L   Glucose,  Bld 184 (H) 70 - 99 mg/dL    Comment: Glucose reference range applies only to samples taken after fasting for at least 8 hours.   BUN 29 (H) 6 - 20 mg/dL   Creatinine, Ser 4.09 (H) 0.61 - 1.24 mg/dL   Calcium 9.4 8.9 - 81.1 mg/dL   Total Protein 7.8 6.5 - 8.1 g/dL   Albumin 3.5 3.5 - 5.0 g/dL   AST 35 15 - 41 U/L   ALT 23 0 - 44 U/L   Alkaline Phosphatase 99 38 - 126 U/L   Total Bilirubin 0.7 0.3 - 1.2 mg/dL   GFR, Estimated 41 (L) >60 mL/min    Comment: (NOTE) Calculated using the CKD-EPI Creatinine Equation (2021)    Anion gap 11 5 - 15    Comment: Performed at [facility], [address] [place], [place] [address]  Ammonia     Status: None   Collection Time: 05/18/21  6:16 PM  Result Value Ref Range   Ammonia 11 9 - 35 umol/L    Comment: Performed at [facility], [address] [place], [place] [address]  SARS CORONAVIRUS 2 (TAT 6-24 HRS) Nasopharyngeal Nasopharyngeal Swab     Status: None   Collection Time: 05/18/21  6:44 PM   Specimen: Nasopharyngeal Swab  Result Value Ref Range   SARS Coronavirus 2 NEGATIVE NEGATIVE    Comment: (NOTE) SARS-CoV-2 target nucleic acids are NOT DETECTED.  The SARS-CoV-2 RNA is generally detectable in upper and lower respiratory specimens during the acute phase of infection. Negative results do not preclude SARS-CoV-2 infection, do not rule out co-infections with other pathogens, and should not be used as the sole basis for treatment or other patient management decisions. Negative results must be combined with clinical observations, patient history, and epidemiological information. The expected result is Negative.  Fact Sheet for Patients: HairSlick.no  Fact Sheet for Healthcare Providers: quierodirigir.com  This test is not yet approved or cleared by the [place] FDA and  has been authorized for detection and/or diagnosis of  SARS-CoV-2 by FDA under an Emergency Use Authorization (EUA). This EUA will remain  in effect (meaning this test can be used) for the duration of the COVID-19 declaration under Se ction 564(b)(1) of the Act, 21 U.S.C. section 360bbb-3(b)(1), unless the authorization is terminated or revoked sooner.  Performed at [facility], [address] [place], [place] [address]   Glucose, capillary     Status: None   Collection Time: 05/18/21 10:29 PM  Result Value Ref Range   Glucose-Capillary 88 70 - 99 mg/dL    Comment: Glucose reference range applies only to samples taken after fasting for at least 8 hours.  Glucose, capillary     Status: Abnormal   Collection Time: 05/19/21  6:48 AM  Result Value Ref Range   Glucose-Capillary 149 (H) 70 - 99 mg/dL    Comment: Glucose reference range applies only to samples taken after fasting for at least 8 hours.  Glucose, capillary     Status: Abnormal   Collection Time: 05/19/21  7:43 AM  Result Value Ref Range   Glucose-Capillary 160 (H) 70 - 99 mg/dL    Comment: Glucose reference range applies only to samples taken after fasting for at least 8 hours.  Blood gas, arterial     Status: Abnormal   Collection Time: 05/19/21 10:36 AM  Result Value Ref Range   FIO2 0.21    pH, Arterial 7.40 7.350 - 7.450  pCO2 arterial 50 (H) 32.0 - 48.0 mmHg   pO2, Arterial 80 (L) 83.0 - 108.0 mmHg   Bicarbonate 31.0 (H) 20.0 - 28.0 mmol/L   Acid-Base Excess 5.2 (H) 0.0 - 2.0 mmol/L   O2 Saturation 95.7 %   Patient temperature 37.0    Collection site LEFT RADIAL    Sample type ARTERIAL DRAW    Allens test (pass/fail) PASS PASS    Comment: Performed at [facility], [address] [place], [place] [address]  Glucose, capillary     Status: Abnormal   Collection Time: 05/19/21 12:07 PM  Result Value Ref Range   Glucose-Capillary 162 (H) 70 - 99 mg/dL    Comment: Glucose reference range applies only to samples taken after fasting for at least 8  hours.  Basic metabolic panel     Status: Abnormal   Collection Time: 05/20/21  7:02 AM  Result Value Ref Range   Sodium 137 135 - 145 mmol/L   Potassium 4.3 3.5 - 5.1 mmol/L   Chloride 96 (L) 98 - 111 mmol/L   CO2 32 22 - 32 mmol/L   Glucose, Bld 145 (H) 70 - 99 mg/dL    Comment: Glucose reference range applies only to samples taken after fasting for at least 8 hours.   BUN 29 (H) 6 - 20 mg/dL   Creatinine, Ser 1.95 (H) 0.61 - 1.24 mg/dL   Calcium 9.6 8.9 - 09.3 mg/dL   GFR, Estimated 49 (L) >60 mL/min    Comment: (NOTE) Calculated using the CKD-EPI Creatinine Equation (2021)    Anion gap 9 5 - 15    Comment: Performed at [facility], [address] [place], [place] [address]    Blood Alcohol level:  Lab Results  Component Value Date   [facility] <10 05/16/2021    Metabolic Disorder Labs: Lab Results  Component Value Date   HGBA1C 10.9 (H) 05/16/2021   MPG 266 05/16/2021   No results found for: PROLACTIN Lab Results  Component Value Date   CHOL 181 05/18/2021   TRIG 146 05/18/2021   HDL 68 05/18/2021   CHOLHDL 2.7 05/18/2021   VLDL 29 05/18/2021   LDLCALC 84 05/18/2021   LDLCALC 77 05/16/2021    Physical Findings: AIMS: Facial and Oral Movements Muscles of Facial Expression: Minimal Lips and Perioral Area: Moderate Jaw: Moderate Tongue: Minimal,Extremity Movements Upper (arms, wrists, hands, fingers): None, normal Lower (legs, knees, ankles, toes): Mild, Trunk Movements Neck, shoulders, hips: None, normal, Overall Severity Severity of abnormal movements (highest score from questions above): Moderate Incapacitation due to abnormal movements: None, normal Patient's awareness of abnormal movements (rate only patient's report): No Awareness, Dental Status Current problems with teeth and/or dentures?: No Does patient usually wear dentures?: No  CIWA:    COWS:     Musculoskeletal: Strength & Muscle Tone: decreased Gait & Station: unable to  stand Patient leans: Backward  Psychiatric Specialty Exam:  Presentation  General Appearance: Casual  Eye Contact:Fair  Speech:Blocked  Speech Volume:Decreased  Handedness:Right   Mood and Affect  Mood: Anxious  Affect:Smiley   Thought Process  Thought Processes:Disorganized  Descriptions of Associations:Loose  Orientation:Full (Time, Place and Person)  Thought Content:Poverty of Content  History of Schizophrenia/Schizoaffective disorder:Yes  Duration of Psychotic Symptoms:Greater than six months  Hallucinations:No data recorded Ideas of Reference:Paranoia  Suicidal Thoughts:No data recorded Homicidal Thoughts:No data recorded  Sensorium  Memory:Immediate Poor; Recent Poor; Remote Poor  Judgment:Impaired  Insight:Poor   Executive Functions  Concentration:Poor  Attention  Span:Poor  Recall:Poor  Fund of Knowledge:Poor  Language:Poor   Psychomotor Activity  Psychomotor Activity: No data recorded  Assets  Assets:Financial Resources/Insurance; Housing   Sleep  Sleep: No data recorded   Physical Exam: Physical Exam Constitutional:      Appearance: He is obese.  HENT:     Head: Normocephalic and atraumatic.     Nose: Nose normal.     Mouth/Throat:     Mouth: Mucous membranes are moist.  Eyes:     Extraocular Movements: Extraocular movements intact.     Conjunctiva/sclera: Conjunctivae normal.     Pupils: Pupils are equal, round, and reactive to light.  Cardiovascular:     Rate and Rhythm: Normal rate.  Pulmonary:     Effort: Pulmonary effort is normal.  Abdominal:     General: Abdomen is flat. Bowel sounds are normal.     Palpations: Abdomen is soft.  Musculoskeletal:     Cervical back: Normal range of motion.  Neurological:     Mental Status: He is alert. Mental status is at baseline.   Review of Systems  Constitutional: Negative.   HENT: Negative.    Eyes: Negative.   Respiratory: Negative.    Cardiovascular:  Negative.   Gastrointestinal: Negative.   Genitourinary: Negative.   Musculoskeletal:  Positive for back pain, falls and joint pain.  Skin: Negative.   Neurological: Negative.   Endo/Heme/Allergies: Negative.   Psychiatric/Behavioral:  The patient is nervous/anxious.   Blood pressure (!) 132/55, pulse 87, temperature 99.4 F (37.4 C), temperature source Oral, resp. rate 17, height 4' (1.219 m), weight 95.3 kg, SpO2 (!) 83 %. Body mass index is 64.11 kg/m.   Treatment Plan Summary: Daily contact with patient to assess and evaluate symptoms and progress in treatment and Medication management  Leo Rod 05/20/2021, 10:26 AM

## 2021-05-20 NOTE — Plan of Care (Signed)
  Problem: Education: Goal: Knowledge of [facility] Education information/materials will improve Outcome: Not Progressing Goal: Emotional status will improve Outcome: Not Progressing Goal: Mental status will improve Outcome: Not Progressing Goal: Verbalization of understanding the information provided will improve Outcome: Not Progressing   Problem: Safety: Goal: Periods of time without injury will increase Outcome: Not Progressing   Problem: Education: Goal: Will be free of psychotic symptoms Outcome: Not Progressing Goal: Knowledge of the prescribed therapeutic regimen will improve Outcome: Not Progressing   Problem: Education: Goal: Will be free of psychotic symptoms Outcome: Not Progressing Goal: Knowledge of the prescribed therapeutic regimen will improve Outcome: Not Progressing   Problem: Safety: Goal: Ability to redirect hostility and anger into socially appropriate behaviors will improve Outcome: Not Progressing Goal: Ability to remain free from injury will improve Outcome: Not Progressing

## 2021-05-20 NOTE — Progress Notes (Signed)
Update Patient  awake and alert this shift. Smiling more frequently and up for all meals. Cooperative with staff and peers. Received phone call from sister [facility]) who is alleged guardian requesting discharge update.  RN informed sister paperwork is needed for her to participated in discharge plan of care. Sister states, "I have find it.". Providers update to patient's medical background. Says patient is hard of hearing but does not wear hearing aids. Sister did not express any additional concerns. Cont to monitor for safety.

## 2021-05-20 NOTE — Group Note (Signed)
LCSW Group Therapy Note  Group Date: 05/20/2021 Start Time: 1300 End Time: 1400   Type of Therapy and Topic:  Group Therapy - Healthy vs Unhealthy Coping Skills  Participation Level:  Did Not Attend   Description of Group The focus of this group was to determine what unhealthy coping techniques typically are used by group members and what healthy coping techniques would be helpful in coping with various problems. Patients were guided in becoming aware of the differences between healthy and unhealthy coping techniques. Patients were asked to identify 2-3 healthy coping skills they would like to learn to use more effectively.  Therapeutic Goals Patients learned that coping is what human beings do all day long to deal with various situations in their lives Patients defined and discussed healthy vs unhealthy coping techniques Patients identified their preferred coping techniques and identified whether these were healthy or unhealthy Patients determined 2-3 healthy coping skills they would like to become more familiar with and use more often. Patients provided support and ideas to each other   Summary of Patient Progress:  Patient did not attend group despite encouraged participation.   Therapeutic Modalities Cognitive Behavioral Therapy Motivational Interviewing  Norberto Sorenson, Theresia Majors 05/20/2021  2:47 PM

## 2021-05-20 NOTE — Progress Notes (Addendum)
1:1 Patient Hourly Rounding     10:00- Patient  sitting up in chair. Awake and alert. 1:1 sitter present @ bedside. Cont to monitor for safety.        14:00- Patient sitting up in chair. Awake and alert.  1:1 sitter present @ bedside. Cont to monitor for safety.        18:00- Patient sitting in chair. Awake and alert. 1:1 sitter present @ bedside. Cont to monitor for safety.

## 2021-05-20 NOTE — Progress Notes (Signed)
PROGRESS NOTE    Larry Cook   HBZ:[id number]  DOB: 19-Jul-1961  DOA: 05/16/2021 PCP: Marguarite Arbour, MD   Brief Narrative:  Larry Cook is a 60 year old male who is developmentally delayed, has hearing loss, is minimally verbal and has a history of schizophrenia, diabetes mellitus with complications of stage IIIa chronic kidney disease, hypertension and dyslipidemia who was brought into the ER by his sister for evaluation of worsening mental health described as increased aggression and poor self-care.  According to his sister patient's behavior acutely worsened over the last 2 weeks where he was noted to become aggressive and had been making inappropriate sexual comments to younger members of the household. At his baseline he is usually minimally verbally responsive but was said to be responding to internal stimuli and able to get around his room as well as to the dining area for his meals. Over the last 24 hours he has been noted to be more sleepy/lethargic and now requires a two-person assist to get to the bathroom. Medical consult was requested for further evaluation.   Subjective: He does not answer my questions but does allow me to examine him and is able to wave at me.     Assessment & Plan:   Principal Problem:   AMS (altered mental status) - increased sedation due to medications- xanax 2 mg QHS and gabapentin 100 mg BID have been discontinued - he is awake, alert, ambulating and eating and drinking   Active Problems:   Schizophrenia (HCC) - per primary team  CKD (chronic kidney disease), stage III (HCC)  - Cr rose slightly to 1.85 yesterday but is down to 1.61 today - last lab work from 2019 reveals Cr was 1.38 and 1.39   Diabetes mellitus type 2, uncomplicated (HCC) - Lantus and Metformin on hold at this time - he is receiving Glipizide - would continue his insulin sliding scale to TID AC/HS and re-institute medications if sugars noted to be rising    Asthma -  no shortness of breath or wheezing noted on exam    Hypertension - receiving Amlodipine    GERD (gastroesophageal reflux disease) - on Protonix daily      Time spent in minutes: 35 DVT prophylaxis: ambulatory Code Status: Full code Family Communication:  Level of Care: Level of care:   Antimicrobials:  Anti-infectives (From admission, onward)    None        Objective: Vitals:   05/19/21 0655 05/19/21 0742 05/19/21 1732 05/20/21 0626  BP: (!) 150/87 128/75  (!) 132/55  Pulse: 92 85 84 87  Resp: 17  18 17   Temp:    99.4 F (37.4 C)  TempSrc:    Oral  SpO2: 100% 97% 96% (!) 83%  Weight:      Height:       No intake or output data in the 24 hours ending 05/20/21 1518 Filed Weights   05/16/21 2054  Weight: 95.3 kg    Examination: General exam: Appears comfortable  HEENT: PERRL, oral mucosa moist, no sclera icterus or thrush Respiratory system: Clear to auscultation. Respiratory effort normal. Cardiovascular system: S1 & S2 heard, RRR.   Gastrointestinal system: Abdomen soft, non-tender, nondistended. Normal bowel sounds. Central nervous system: Alert and sitting up in a chair- not verbally communicative- moves all extremities Extremities: No cyanosis, clubbing or edema Skin: No rashes or ulcers Psychiatry: appears calm, pleasant and cooperative    Data Reviewed: I have personally reviewed following labs and imaging studies  CBC:  Recent Labs  Lab 05/16/21 0151 05/18/21 1816  WBC 8.6 7.0  NEUTROABS  --  4.4  HGB 11.9* 12.5*  HCT 36.5* 38.6*  MCV 89.0 91.3  PLT 291 308   Basic Metabolic Panel: Recent Labs  Lab 05/16/21 0151 05/18/21 1816 05/20/21 0702  NA 136 137 137  K 4.1 3.9 4.3  CL 96* 97* 96*  CO2 27 29 32  GLUCOSE 226* 184* 145*  BUN 22* 29* 29*  CREATININE 1.63* 1.85* 1.61*  CALCIUM 9.1 9.4 9.6   GFR: Estimated Creatinine Clearance: 36.1 mL/min (A) (by C-G formula based on SCr of 1.61 mg/dL (H)). Liver Function Tests: Recent  Labs  Lab 05/16/21 0151 05/18/21 1816  AST 29 35  ALT 19 23  ALKPHOS 114 99  BILITOT 0.5 0.7  PROT 7.9 7.8  ALBUMIN 3.6 3.5   No results for input(s): LIPASE, AMYLASE in the last 168 hours. Recent Labs  Lab 05/18/21 1816  AMMONIA 11   Coagulation Profile: No results for input(s): INR, PROTIME in the last 168 hours. Cardiac Enzymes: No results for input(s): CKTOTAL, CKMB, CKMBINDEX, TROPONINI in the last 168 hours. BNP (last 3 results) No results for input(s): PROBNP in the last 8760 hours. HbA1C: No results for input(s): HGBA1C in the last 72 hours. CBG: Recent Labs  Lab 05/18/21 1612 05/18/21 2229 05/19/21 0648 05/19/21 0743 05/19/21 1207  GLUCAP 109* 88 149* 160* 162*   Lipid Profile: Recent Labs    05/18/21 0703  CHOL 181  HDL 68  LDLCALC 84  TRIG 146  CHOLHDL 2.7   Thyroid Function Tests: No results for input(s): TSH, T4TOTAL, FREET4, T3FREE, THYROIDAB in the last 72 hours. Anemia Panel: No results for input(s): VITAMINB12, FOLATE, FERRITIN, TIBC, IRON, RETICCTPCT in the last 72 hours. Urine analysis:    Component Value Date/Time   COLORURINE YELLOW (A) 01/04/2018 1941   APPEARANCEUR CLEAR (A) 01/04/2018 1941   APPEARANCEUR Clear 06/01/2014 1244   LABSPEC 1.014 01/04/2018 1941   LABSPEC 1.006 06/01/2014 1244   PHURINE 5.0 01/04/2018 1941   GLUCOSEU NEGATIVE 01/04/2018 1941   GLUCOSEU 50 mg/dL 56/21/3086 5784   HGBUR NEGATIVE 01/04/2018 1941   BILIRUBINUR NEGATIVE 01/04/2018 1941   BILIRUBINUR Negative 06/01/2014 1244   KETONESUR NEGATIVE 01/04/2018 1941   PROTEINUR 30 (A) 01/04/2018 1941   NITRITE NEGATIVE 01/04/2018 1941   LEUKOCYTESUR NEGATIVE 01/04/2018 1941   LEUKOCYTESUR Negative 06/01/2014 1244   Sepsis Labs: @LABRCNTIP (procalcitonin:4,lacticidven:4) ) Recent Results (from the past 240 hour(s))  Resp Panel by RT-PCR (Flu A&B, Covid) Nasopharyngeal Swab     Status: None   Collection Time: 05/16/21  6:12 AM   Specimen: Nasopharyngeal  Swab; Nasopharyngeal(NP) swabs in vial transport medium  Result Value Ref Range Status   SARS Coronavirus 2 by RT PCR NEGATIVE NEGATIVE Final    Comment: (NOTE) SARS-CoV-2 target nucleic acids are NOT DETECTED.  The SARS-CoV-2 RNA is generally detectable in upper respiratory specimens during the acute phase of infection. The lowest concentration of SARS-CoV-2 viral copies this assay can detect is 138 copies/mL. A negative result does not preclude SARS-Cov-2 infection and should not be used as the sole basis for treatment or other patient management decisions. A negative result may occur with  improper specimen collection/handling, submission of specimen other than nasopharyngeal swab, presence of viral mutation(s) within the areas targeted by this assay, and inadequate number of viral copies(<138 copies/mL). A negative result must be combined with clinical observations, patient history, and epidemiological information. The expected result is Negative.  Fact Sheet for Patients:  BloggerCourse.com  Fact Sheet for Healthcare Providers:  SeriousBroker.it  This test is no t yet approved or cleared by the [place] FDA and  has been authorized for detection and/or diagnosis of SARS-CoV-2 by FDA under an Emergency Use Authorization (EUA). This EUA will remain  in effect (meaning this test can be used) for the duration of the COVID-19 declaration under Section 564(b)(1) of the Act, 21 U.S.C.section 360bbb-3(b)(1), unless the authorization is terminated  or revoked sooner.       Influenza A by PCR NEGATIVE NEGATIVE Final   Influenza B by PCR NEGATIVE NEGATIVE Final    Comment: (NOTE) The Xpert Xpress SARS-CoV-2/FLU/RSV plus assay is intended as an aid in the diagnosis of influenza from Nasopharyngeal swab specimens and should not be used as a sole basis for treatment. Nasal washings and aspirates are unacceptable for Xpert Xpress  SARS-CoV-2/FLU/RSV testing.  Fact Sheet for Patients: BloggerCourse.com  Fact Sheet for Healthcare Providers: SeriousBroker.it  This test is not yet approved or cleared by the [place] FDA and has been authorized for detection and/or diagnosis of SARS-CoV-2 by FDA under an Emergency Use Authorization (EUA). This EUA will remain in effect (meaning this test can be used) for the duration of the COVID-19 declaration under Section 564(b)(1) of the Act, 21 U.S.C. section 360bbb-3(b)(1), unless the authorization is terminated or revoked.  Performed at [facility], [address] [place], [place] [address]   SARS CORONAVIRUS 2 (TAT 6-24 HRS) Nasopharyngeal Nasopharyngeal Swab     Status: None   Collection Time: 05/18/21  6:44 PM   Specimen: Nasopharyngeal Swab  Result Value Ref Range Status   SARS Coronavirus 2 NEGATIVE NEGATIVE Final    Comment: (NOTE) SARS-CoV-2 target nucleic acids are NOT DETECTED.  The SARS-CoV-2 RNA is generally detectable in upper and lower respiratory specimens during the acute phase of infection. Negative results do not preclude SARS-CoV-2 infection, do not rule out co-infections with other pathogens, and should not be used as the sole basis for treatment or other patient management decisions. Negative results must be combined with clinical observations, patient history, and epidemiological information. The expected result is Negative.  Fact Sheet for Patients: HairSlick.no  Fact Sheet for Healthcare Providers: quierodirigir.com  This test is not yet approved or cleared by the [place] FDA and  has been authorized for detection and/or diagnosis of SARS-CoV-2 by FDA under an Emergency Use Authorization (EUA). This EUA will remain  in effect (meaning this test can be used) for the duration of the COVID-19 declaration under Se ction  564(b)(1) of the Act, 21 U.S.C. section 360bbb-3(b)(1), unless the authorization is terminated or revoked sooner.  Performed at [facility], [address] [place], [place] [address]          Radiology Studies: DG Chest 2 View  Result Date: 05/18/2021 CLINICAL DATA:  Short of breath, hypertension, diabetes EXAM: CHEST - 2 VIEW COMPARISON:  01/26/2010 FINDINGS: Frontal and lateral views of the chest demonstrate an unremarkable cardiac silhouette. No acute airspace disease, effusion, or pneumothorax. Mild central vascular congestion. No acute bony abnormalities. IMPRESSION: 1. Mild central vascular congestion.  No acute airspace disease. Electronically Signed   By: Sharlet Salina M.D.   On: 05/18/2021 19:09   CT HEAD WO CONTRAST ( )  Result Date: 05/19/2021 CLINICAL DATA:  Mental status change with unknown cause EXAM: CT HEAD WITHOUT CONTRAST TECHNIQUE: Contiguous axial images were obtained from the base of the skull through  the vertex without intravenous contrast. COMPARISON:  01/04/2018 FINDINGS: Brain: No evidence of acute infarction, hemorrhage, hydrocephalus, extra-axial collection or mass lesion/mass effect. Right cerebellar ectopia. Central predominant volume loss with stable pattern since at least a 2009 brain MRI. Mild chronic white matter low-density attributed to small-vessel disease given medical history. Vascular: Atheromatous calcification. Skull: No acute finding Sinuses/Orbits: Negative IMPRESSION: No acute finding or change from 2019. Electronically Signed   By: Tiburcio Pea M.D.   On: 05/19/2021 11:15      Scheduled Meds:  amLODipine  5 mg Oral Daily   aspirin EC  81 mg Oral Daily   glipiZIDE  5 mg Oral QAC breakfast   insulin aspart  0-15 Units Subcutaneous TID WC   Ipratropium-Albuterol  1 puff Inhalation Q6H   paliperidone  3 mg Oral QHS   pantoprazole  40 mg Oral Daily   pramipexole  0.25 mg Oral QHS   pravastatin  20 mg Oral q1800   Continuous  Infusions:   LOS: 4 days      Calvert Cantor, MD [facility] Pager: [email or web address] 05/20/2021, 3:18 PM

## 2021-05-21 NOTE — Progress Notes (Signed)
D: Patient awake and alert. Standing up in room, at doorway and ambulating in hallway when prompted. Unable to verbalize needs do to his intellectual disability. Patient prompted for meals and medications and compliant with both. No s/s of distress noted.   A: Blood sugars monitored frequently and vital signs assessed per MD order. Patient provided ongoing emotional support by nursing staff.   R: No adverse reactions to medications noted. Will Cont Q15 minute checks for safety.

## 2021-05-21 NOTE — Progress Notes (Addendum)
[facility] MD Progress Note  05/21/2021 10:00 AM Larry Cook  MRN:  [id number] Subjective:   Nursing Report : Patient  awake and alert this shift. Smiling more frequently and up for all meals. Cooperative with staff and peers. Received phone call from sister [facility] Jac Canavan) who is alleged guardian requesting discharge update.  RN informed sister paperwork is needed for her to participated in discharge plan of care. Sister states, "I have find it.". Providers update to patient's medical background. Says patient is hard of hearing but does not wear hearing aids. Sister did not express any additional concerns.  Patient has been calm and co-operative on the floor, is presenting in a calm and pleasant manner and reports feeling well. He is trying to talk a bit but mostly answers with nods. He is compliant with his meds, slept well last night and has fair appetite.  Principal Problem: AMS (altered mental status) Diagnosis: Principal Problem:   AMS (altered mental status) Active Problems:   Schizophrenia (HCC)   Asthma   Hypertension   Dyslipidemia   GERD (gastroesophageal reflux disease)   CKD (chronic kidney disease), stage III (HCC)   Diabetes mellitus type 2, uncomplicated (HCC)  Total Time spent with patient: 20 minutes  Past Psychiatric History: Schizophrenia Intellectual disability  Past Medical History:  Past Medical History:  Diagnosis Date   Diabetes mellitus without complication (HCC)    Hypertension    Mentally disabled    Schizophrenia (HCC)    History reviewed. No pertinent surgical history. Family History: History reviewed. No pertinent family history. Family Psychiatric  History:  none pertinent Social History:  Social History   Substance and Sexual Activity  Alcohol Use No     Social History   Substance and Sexual Activity  Drug Use No    Social History   Socioeconomic History   Marital status: Single    Spouse name: Not on file   Number of children: Not on  file   Years of education: Not on file   Highest education level: Not on file  Occupational History   Not on file  Tobacco Use   Smoking status: Never   Smokeless tobacco: Never  Substance and Sexual Activity   Alcohol use: No   Drug use: No   Sexual activity: Not on file  Other Topics Concern   Not on file  Social History Narrative   Not on file   Social Determinants of Health   Financial Resource Strain: Not on file  Food Insecurity: Not on file  Transportation Needs: Not on file  Physical Activity: Not on file  Stress: Not on file  Social Connections: Not on file   Additional Social History:                         Sleep: Fair  Appetite:  Fair  Current Medications: Current Facility-Administered Medications  Medication Dose Route Frequency Provider Last Rate Last Admin   acetaminophen (TYLENOL) tablet 650 mg  650 mg Oral Q6H PRN [name], [name], MD       albuterol (PROVENTIL) (2.5 MG/3ML) 0.083% nebulizer solution 2.5 mg  2.5 mg Inhalation Q2H PRN Charlsie Quest, MD       albuterol (VENTOLIN HFA) 108 (90 Base) MCG/ACT inhaler 2 puff  2 puff Inhalation Q4H PRN Jesse Sans, MD       alum & mag hydroxide-simeth (MAALOX/MYLANTA) 200-200-20 MG/5ML suspension 30 mL  30 mL Oral Q4H PRN [name], [name],  MD       amLODipine (NORVASC) tablet 5 mg  5 mg Oral Daily [name], Jackquline Denmark, MD   5 mg at 05/21/21 0748   aspirin EC tablet 81 mg  81 mg Oral Daily [name], Jackquline Denmark, MD   81 mg at 05/21/21 0748   glipiZIDE (GLUCOTROL) tablet 5 mg  5 mg Oral QAC breakfast Jesse Sans, MD   5 mg at 05/21/21 0748   hydrOXYzine (ATARAX/VISTARIL) tablet 50 mg  50 mg Oral Q6H PRN [name], Jackquline Denmark, MD       insulin aspart (novoLOG) injection 0-15 Units  0-15 Units Subcutaneous TID WC Martyn Malay, RPH   15 Units at 05/20/21 1256   Ipratropium-Albuterol (COMBIVENT) respimat 1 puff  1 puff Inhalation Q6H Jesse Sans, MD   1 puff at 05/20/21 2214   magnesium hydroxide (MILK OF  MAGNESIA) suspension 30 mL  30 mL Oral Daily PRN [name], Jackquline Denmark, MD       paliperidone (INVEGA) 24 hr tablet 3 mg  3 mg Oral QHS [name], [name], MD   3 mg at 05/20/21 2214   pantoprazole (PROTONIX) EC tablet 40 mg  40 mg Oral Daily [name], Jackquline Denmark, MD   40 mg at 05/21/21 0748   pramipexole (MIRAPEX) tablet 0.25 mg  0.25 mg Oral QHS [name], [name], MD   0.25 mg at 05/20/21 2213   pravastatin (PRAVACHOL) tablet 20 mg  20 mg Oral q1800 [name], Jackquline Denmark, MD   20 mg at 05/20/21 2213    Lab Results:  Results for orders placed or performed during the hospital encounter of 05/16/21 (from the past 48 hour(s))  Blood gas, arterial     Status: Abnormal   Collection Time: 05/19/21 10:36 AM  Result Value Ref Range   FIO2 0.21    pH, Arterial 7.40 7.350 - 7.450   pCO2 arterial 50 (H) 32.0 - 48.0 mmHg   pO2, Arterial 80 (L) 83.0 - 108.0 mmHg   Bicarbonate 31.0 (H) 20.0 - 28.0 mmol/L   Acid-Base Excess 5.2 (H) 0.0 - 2.0 mmol/L   O2 Saturation 95.7 %   Patient temperature 37.0    Collection site LEFT RADIAL    Sample type ARTERIAL DRAW    Allens test (pass/fail) PASS PASS    Comment: Performed at [facility], [address] [place], [place] [address]  Glucose, capillary     Status: Abnormal   Collection Time: 05/19/21 12:07 PM  Result Value Ref Range   Glucose-Capillary 162 (H) 70 - 99 mg/dL    Comment: Glucose reference range applies only to samples taken after fasting for at least 8 hours.  Basic metabolic panel     Status: Abnormal   Collection Time: 05/20/21  7:02 AM  Result Value Ref Range   Sodium 137 135 - 145 mmol/L   Potassium 4.3 3.5 - 5.1 mmol/L   Chloride 96 (L) 98 - 111 mmol/L   CO2 32 22 - 32 mmol/L   Glucose, Bld 145 (H) 70 - 99 mg/dL    Comment: Glucose reference range applies only to samples taken after fasting for at least 8 hours.   BUN 29 (H) 6 - 20 mg/dL   Creatinine, Ser 7.40 (H) 0.61 - 1.24 mg/dL   Calcium 9.6 8.9 - 81.4 mg/dL   GFR, Estimated 49 (L) >60  mL/min    Comment: (NOTE) Calculated using the CKD-EPI Creatinine Equation (2021)    Anion gap 9 5 -  15    Comment: Performed at [facility], [address] [place], [place] [address]    Blood Alcohol level:  Lab Results  Component Value Date   [facility] <10 05/16/2021    Metabolic Disorder Labs: Lab Results  Component Value Date   HGBA1C 10.9 (H) 05/16/2021   MPG 266 05/16/2021   No results found for: PROLACTIN Lab Results  Component Value Date   CHOL 181 05/18/2021   TRIG 146 05/18/2021   HDL 68 05/18/2021   CHOLHDL 2.7 05/18/2021   VLDL 29 05/18/2021   LDLCALC 84 05/18/2021   LDLCALC 77 05/16/2021    Physical Findings: AIMS: Facial and Oral Movements Muscles of Facial Expression: Minimal Lips and Perioral Area: Moderate Jaw: Moderate Tongue: Minimal,Extremity Movements Upper (arms, wrists, hands, fingers): None, normal Lower (legs, knees, ankles, toes): Mild, Trunk Movements Neck, shoulders, hips: None, normal, Overall Severity Severity of abnormal movements (highest score from questions above): Moderate Incapacitation due to abnormal movements: None, normal Patient's awareness of abnormal movements (rate only patient's report): No Awareness, Dental Status Current problems with teeth and/or dentures?: No Does patient usually wear dentures?: No  CIWA:    COWS:     Musculoskeletal: Strength & Muscle Tone: within normal limits Gait & Station: broad based Patient leans: N/A  Psychiatric Specialty Exam:  Presentation  General Appearance: Casual  Eye Contact:Fair  Speech:Blocked  Speech Volume:Decreased  Handedness:Right   Mood and Affect  Mood: Pleasant  Affect: Congruent   Thought Process  Thought Processes:Disorganized  Descriptions of Associations:Loose  Orientation: Place and person  Thought Content:Paranoid Ideation  History of Schizophrenia/Schizoaffective disorder:Yes  Duration of Psychotic Symptoms:Greater than six  months  Hallucinations: Denies  Ideas of Reference:Paranoia  Suicidal Thoughts: Denies  Homicidal Thoughts: Denies  Sensorium  Memory:Immediate Poor; Recent Poor; Remote Poor  Judgment:Impaired  Insight:Poor   Executive Functions  Concentration:Poor  Attention Span:Poor  Recall:Poor  Fund of Knowledge:Poor  Language:Poor   Psychomotor Activity  Psychomotor Activity: No data recorded  Assets  Assets:Financial Resources/Insurance; Housing   Sleep  Sleep: No data recorded   Physical Exam: Physical Exam Constitutional:      Appearance: He is obese.  HENT:     Head: Normocephalic and atraumatic.     Right Ear: Tympanic membrane normal.     Left Ear: Tympanic membrane normal.     Nose: Nose normal.     Mouth/Throat:     Mouth: Mucous membranes are moist.     Pharynx: Oropharynx is clear.  Eyes:     Extraocular Movements: Extraocular movements intact.     Conjunctiva/sclera: Conjunctivae normal.     Pupils: Pupils are equal, round, and reactive to light.  Cardiovascular:     Rate and Rhythm: Normal rate and regular rhythm.     Pulses: Normal pulses.     Heart sounds: Normal heart sounds.  Pulmonary:     Effort: Pulmonary effort is normal.  Abdominal:     General: Bowel sounds are normal.  Musculoskeletal:        General: Normal range of motion.     Cervical back: Normal range of motion.  Skin:    General: Skin is warm.  Neurological:     General: No focal deficit present.     Mental Status: He is alert.   Review of Systems  Constitutional: Negative.   HENT: Negative.    Eyes: Negative.   Respiratory: Negative.    Cardiovascular: Negative.   Gastrointestinal: Negative.   Genitourinary: Negative.  Musculoskeletal: Negative.   Skin: Negative.   Neurological: Negative.   Endo/Heme/Allergies: Negative.   Psychiatric/Behavioral:  The patient is nervous/anxious.   Blood pressure (!) 121/57, pulse 82, temperature 99.4 F (37.4 C), temperature  source Oral, resp. rate 18, height 4' (1.219 m), weight 95.3 kg, SpO2 99 %. Body mass index is 64.11 kg/m.   Treatment Plan Summary: Daily contact with patient to assess and evaluate symptoms and progress in treatment, Medication management, and Plan : Continue current dose of Invega.   Naod Sweetland 05/21/2021, 10:00 AM

## 2021-05-21 NOTE — Group Note (Signed)
LCSW Group Therapy Note  Group Date: 05/21/2021 Start Time: 1305 End Time: 1405   Type of Therapy and Topic:  Group Therapy: Anger Cues and Responses  Participation Level:  Did Not Attend   Description of Group:   In this group, patients learned how to recognize the physical, cognitive, emotional, and behavioral responses they have to anger-provoking situations.  They identified a recent time they became angry and how they reacted.  They analyzed how their reaction was possibly beneficial and how it was possibly unhelpful.  The group discussed a variety of healthier coping skills that could help with such a situation in the future.  Focus was placed on how helpful it is to recognize the underlying emotions to our anger, because working on those can lead to a more permanent solution as well as our ability to focus on the important rather than the urgent.  Therapeutic Goals: Patients will remember their last incident of anger and how they felt emotionally and physically, what their thoughts were at the time, and how they behaved. Patients will identify how their behavior at that time worked for them, as well as how it worked against them. Patients will explore possible new behaviors to use in future anger situations. Patients will learn that anger itself is normal and cannot be eliminated, and that healthier reactions can assist with resolving conflict rather than worsening situations.  Summary of Patient Progress:  Patient did not attend group despite encouraged participation.   Therapeutic Modalities:   Cognitive Behavioral Therapy    Norberto Sorenson, LCSWA 05/21/2021  2:30 PM

## 2021-05-21 NOTE — Progress Notes (Signed)
Patient alert and oriented x 2 with periods of confusion to situation and time, he is noted HOH to both ears, he forwards very little information by nodding or shrugging shoulder, he is on 1:1 for safety, 15 minutes safety checks maintained will continue to monitor.

## 2021-05-21 NOTE — Progress Notes (Signed)
Patient is stable today. [facility] will sign off. Thank you for allowing [place] to assist with this patient. Please call again if needed.   Calvert Cantor, MD

## 2021-05-22 LAB — GLUCOSE, CAPILLARY
Glucose-Capillary: 213 mg/dL — ABNORMAL HIGH (ref 70–99)
Glucose-Capillary: 274 mg/dL — ABNORMAL HIGH (ref 70–99)
Glucose-Capillary: 140 mg/dL — ABNORMAL HIGH (ref 70–99)
Glucose-Capillary: 324 mg/dL — ABNORMAL HIGH (ref 70–99)
Glucose-Capillary: 83 mg/dL (ref 70–99)
Glucose-Capillary: 169 mg/dL — ABNORMAL HIGH (ref 70–99)
Glucose-Capillary: 305 mg/dL — ABNORMAL HIGH (ref 70–99)
Glucose-Capillary: 231 mg/dL — ABNORMAL HIGH (ref 70–99)
Glucose-Capillary: 151 mg/dL — ABNORMAL HIGH (ref 70–99)
Glucose-Capillary: 134 mg/dL — ABNORMAL HIGH (ref 70–99)
Glucose-Capillary: 276 mg/dL — ABNORMAL HIGH (ref 70–99)
Glucose-Capillary: 150 mg/dL — ABNORMAL HIGH (ref 70–99)
Glucose-Capillary: 206 mg/dL — ABNORMAL HIGH (ref 70–99)
Glucose-Capillary: 244 mg/dL — ABNORMAL HIGH (ref 70–99)
Glucose-Capillary: 286 mg/dL — ABNORMAL HIGH (ref 70–99)
Glucose-Capillary: 151 mg/dL — ABNORMAL HIGH (ref 70–99)

## 2021-05-22 MED ORDER — METFORMIN HCL 500 MG PO TABS
1000.0000 mg | ORAL_TABLET | Freq: Two times a day (BID) | ORAL | Status: DC
  Administered 2021-05-22 – 2021-05-23 (×2): 1000 mg via ORAL
  Filled 2021-05-22 (×2): qty 2

## 2021-05-22 MED ORDER — PALIPERIDONE PALMITATE ER 156 MG/ML IM SUSY
78.0000 mg | PREFILLED_SYRINGE | INTRAMUSCULAR | Status: DC
  Administered 2021-05-22: 78 mg via INTRAMUSCULAR
  Filled 2021-05-22 (×2): qty 1

## 2021-05-22 NOTE — BH IP Treatment Plan (Signed)
Interdisciplinary Treatment and Diagnostic Plan Update  05/22/2021 Time of Session: 8:30AM Larry Cook MRN: [id number]  Principal Diagnosis: AMS (altered mental status)  Secondary Diagnoses: Principal Problem:   AMS (altered mental status) Active Problems:   Schizophrenia (HCC)   Asthma   Hypertension   Dyslipidemia   GERD (gastroesophageal reflux disease)   CKD (chronic kidney disease), stage III (HCC)   Diabetes mellitus type 2, uncomplicated (HCC)   Current Medications:  Current Facility-Administered Medications  Medication Dose Route Frequency Provider Last Rate Last Admin   acetaminophen (TYLENOL) tablet 650 mg  650 mg Oral Q6H PRN [name], [name], MD       albuterol (PROVENTIL) (2.5 MG/3ML) 0.083% nebulizer solution 2.5 mg  2.5 mg Inhalation Q2H PRN Charlsie Quest, MD       albuterol (VENTOLIN HFA) 108 (90 Base) MCG/ACT inhaler 2 puff  2 puff Inhalation Q4H PRN Jesse Sans, MD   2 puff at 05/22/21 0745   alum & mag hydroxide-simeth (MAALOX/MYLANTA) 200-200-20 MG/5ML suspension 30 mL  30 mL Oral Q4H PRN [name], [name], MD       amLODipine (NORVASC) tablet 5 mg  5 mg Oral Daily [name], [name], MD   5 mg at 05/22/21 2297   aspirin EC tablet 81 mg  81 mg Oral Daily [name], [name], MD   81 mg at 05/22/21 0742   glipiZIDE (GLUCOTROL) tablet 5 mg  5 mg Oral QAC breakfast Jesse Sans, MD   5 mg at 05/22/21 9892   hydrOXYzine (ATARAX/VISTARIL) tablet 50 mg  50 mg Oral Q6H PRN [name], [name], MD       insulin aspart (novoLOG) injection 0-15 Units  0-15 Units Subcutaneous TID WC BeersSherlynn Carbon, RPH   5 Units at 05/22/21 1194   Ipratropium-Albuterol (COMBIVENT) respimat 1 puff  1 puff Inhalation Q6H Jesse Sans, MD   1 puff at 05/21/21 2135   magnesium hydroxide (MILK OF MAGNESIA) suspension 30 mL  30 mL Oral Daily PRN [name], Jackquline Denmark, MD       paliperidone (INVEGA) 24 hr tablet 3 mg  3 mg Oral QHS [name], [name], MD   3 mg at 05/21/21 2134   pantoprazole  (PROTONIX) EC tablet 40 mg  40 mg Oral Daily [name], [name], MD   40 mg at 05/22/21 1740   pramipexole (MIRAPEX) tablet 0.25 mg  0.25 mg Oral QHS [name], [name], MD   0.25 mg at 05/21/21 2134   pravastatin (PRAVACHOL) tablet 20 mg  20 mg Oral q1800 [name], [name], MD   20 mg at 05/21/21 2134   PTA Medications: Medications Prior to Admission  Medication Sig Dispense Refill Last Dose   ALPRAZolam (XANAX) 1 MG tablet Take 1 mg by mouth 3 (three) times daily as needed for sleep.  1    amLODipine (NORVASC) 5 MG tablet Take 5 mg by mouth daily.  2    aspirin EC 81 MG tablet Take 81 mg by mouth daily.      gabapentin (NEURONTIN) 100 MG capsule Take 100 mg by mouth 2 (two) times daily.  3    HYDROcodone-acetaminophen (NORCO) 10-325 MG tablet Take 1 tablet by mouth every 6 (six) hours as needed for pain.  0    insulin glargine (LANTUS) 100 UNIT/ML injection Inject 20 Units into the skin at bedtime.      lovastatin (MEVACOR) 40 MG tablet Take 40 mg by mouth daily with supper.  3  metFORMIN (GLUCOPHAGE) 1000 MG tablet Take 1,000 mg by mouth 2 (two) times daily.  3    omeprazole (PRILOSEC) 20 MG capsule Take 20 mg by mouth 2 (two) times daily.  3    pramipexole (MIRAPEX) 0.5 MG tablet Take 0.5 mg by mouth 3 (three) times daily.      SYMBICORT 160-4.5 MCG/ACT inhaler Inhale 2 puffs into the lungs 2 (two) times daily.       Patient Stressors: Marital or family conflict   Medication change or noncompliance    Patient Strengths: Physical Health  Supportive family/friends   Treatment Modalities: Medication Management, Group therapy, Case management,  1 to 1 session with clinician, Psychoeducation, Recreational therapy.   Physician Treatment Plan for Primary Diagnosis: AMS (altered mental status) Long Term Goal(s): Improvement in symptoms so as ready for discharge   Short Term Goals: Ability to identify changes in lifestyle to reduce recurrence of condition will improve Ability to verbalize  feelings will improve Ability to disclose and discuss suicidal ideas Ability to demonstrate self-control will improve Ability to identify and develop effective coping behaviors will improve Ability to maintain clinical measurements within normal limits will improve Compliance with prescribed medications will improve Ability to identify triggers associated with substance abuse/mental health issues will improve  Medication Management: Evaluate patient's response, side effects, and tolerance of medication regimen.  Therapeutic Interventions: 1 to 1 sessions, Unit Group sessions and Medication administration.  Evaluation of Outcomes: Not Progressing  Physician Treatment Plan for Secondary Diagnosis: Principal Problem:   AMS (altered mental status) Active Problems:   Schizophrenia (HCC)   Asthma   Hypertension   Dyslipidemia   GERD (gastroesophageal reflux disease)   CKD (chronic kidney disease), stage III (HCC)   Diabetes mellitus type 2, uncomplicated (HCC)  Long Term Goal(s): Improvement in symptoms so as ready for discharge   Short Term Goals: Ability to identify changes in lifestyle to reduce recurrence of condition will improve Ability to verbalize feelings will improve Ability to disclose and discuss suicidal ideas Ability to demonstrate self-control will improve Ability to identify and develop effective coping behaviors will improve Ability to maintain clinical measurements within normal limits will improve Compliance with prescribed medications will improve Ability to identify triggers associated with substance abuse/mental health issues will improve     Medication Management: Evaluate patient's response, side effects, and tolerance of medication regimen.  Therapeutic Interventions: 1 to 1 sessions, Unit Group sessions and Medication administration.  Evaluation of Outcomes: Not Progressing   RN Treatment Plan for Primary Diagnosis: AMS (altered mental status) Long Term  Goal(s): Knowledge of disease and therapeutic regimen to maintain health will improve  Short Term Goals: Ability to remain free from injury will improve, Ability to verbalize frustration and anger appropriately will improve, Ability to demonstrate self-control, Ability to participate in decision making will improve, Ability to verbalize feelings will improve, Ability to disclose and discuss suicidal ideas, Ability to identify and develop effective coping behaviors will improve, and Compliance with prescribed medications will improve  Medication Management: RN will administer medications as ordered by provider, will assess and evaluate patient's response and provide education to patient for prescribed medication. RN will report any adverse and/or side effects to prescribing provider.  Therapeutic Interventions: 1 on 1 counseling sessions, Psychoeducation, Medication administration, Evaluate responses to treatment, Monitor vital signs and CBGs as ordered, Perform/monitor CIWA, COWS, AIMS and Fall Risk screenings as ordered, Perform wound care treatments as ordered.  Evaluation of Outcomes: Not Progressing   LCSW Treatment Plan  for Primary Diagnosis: AMS (altered mental status) Long Term Goal(s): Safe transition to appropriate next level of care at discharge, Engage patient in therapeutic group addressing interpersonal concerns.  Short Term Goals: Engage patient in aftercare planning with referrals and resources, Increase social support, Increase ability to appropriately verbalize feelings, Increase emotional regulation, Facilitate acceptance of mental health diagnosis and concerns, Facilitate patient progression through stages of change regarding substance use diagnoses and concerns, Identify triggers associated with mental health/substance abuse issues, and Increase skills for wellness and recovery  Therapeutic Interventions: Assess for all discharge needs, 1 to 1 time with Social worker, Explore  available resources and support systems, Assess for adequacy in community support network, Educate family and significant other(s) on suicide prevention, Complete Psychosocial Assessment, Interpersonal group therapy.  Evaluation of Outcomes: Not Progressing   Progress in Treatment: Attending groups: No. Participating in groups: No. Taking medication as prescribed: Yes. Toleration medication: Yes. Family/Significant other contact made: Yes, individual(s) contacted:  SPE completed with patient, CSW will obtain consent to reach collateral.  Patient understands diagnosis: No. Discussing patient identified problems/goals with staff: No. Medical problems stabilized or resolved: Yes. Denies suicidal/homicidal ideation: Yes. Issues/concerns per patient self-inventory: Yes. Other: none.  New problem(s) identified: No, Describe:  Patient currently exhibiting psychotic features, largely disorganized. Unable to participate in treatment team meeting at this time. CSW will discuss goals with patient once progress has been made.    New Short Term/Long Term Goal(s): Patient to work toward elimination of symptoms of psychosis, medication management for mood stabilization; elimination of SI thoughts; development of comprehensive mental wellness plan. Update 05/22/21: No changes at this time.   Patient Goals:  No, Describe:  Patient currently exhibiting psychotic features, largely disorganized. Unable to participate in treatment team meeting at this time. CSW will discuss goals with patient once progress has been made. Update 05/22/21: Pt remains the same and has been unable to participate in assessment process/identification of goals.   Discharge Plan or Barriers: No barriers identified at this time. Update 05/22/21: Plans for discharge/aftercare have not been able to be assessed due to pt condition. CSW will continue to assess situation and will assist with an appropriate aftercare/discharge plan.    Reason  for Continuation of Hospitalization: Hallucinations Other; describe Psychotic features.    Estimated Length of Stay: 1-7 days   Scribe for Treatment Team: [name], LCSW 05/22/2021 9:32 AM

## 2021-05-22 NOTE — BHH Counselor (Signed)
CSW spoke with the patient's sister [place], [phone].  She reports that she is the patient's guardian.  CSW notes that the patient's chart does not indicate this.  CSW asked for paperwork confirming this and she reports that she has lost the paperwork.  CSW asked if pt and sister went to court regarding guardianship and sister reports "social services did".  CSW asked for the "social services" staff that worked with the family and sister reports that she can not "recall the name and I don't think they work they're anymore".  She reports that the patient has been in her care "since 1986".  She reports that the patient's PCP is Dr. Judithann Sheen, his follow up appointment with Dr. Judithann Sheen is 06/26/2021 at 10:30 AM.  She reports that the patient receives mental health care at [facility], [phone].  She requested CSW assistance in scheduling a follow up appointment.  CSW will also check with the patient.   Penni Homans, MSW, LCSW 05/22/2021 12:01 PM

## 2021-05-22 NOTE — Group Note (Signed)
B[facility] LCSW Group Therapy Note    Group Date: 05/22/2021 Start Time: 1315 End Time: 1400  Type of Therapy and Topic:  Group Therapy:  Overcoming Obstacles  Participation Level:  [facility] PARTICIPATION LEVEL: Did Not Attend  Description of Group:   In this group patients will be encouraged to explore what they see as obstacles to their own wellness and recovery. They will be guided to discuss their thoughts, feelings, and behaviors related to these obstacles. The group will process together ways to cope with barriers, with attention given to specific choices patients can make. Each patient will be challenged to identify changes they are motivated to make in order to overcome their obstacles. This group will be process-oriented, with patients participating in exploration of their own experiences as well as giving and receiving support and challenge from other group members.  Therapeutic Goals: 1. Patient will identify personal and current obstacles as they relate to admission. 2. Patient will identify barriers that currently interfere with their wellness or overcoming obstacles.  3. Patient will identify feelings, thought process and behaviors related to these barriers. 4. Patient will identify two changes they are willing to make to overcome these obstacles:    Summary of Patient Progress  X   Therapeutic Modalities:   Cognitive Behavioral Therapy Solution Focused Therapy Motivational Interviewing Relapse Prevention Therapy   Wynelle Dreier A Tenicia Gural, LCSWA 

## 2021-05-22 NOTE — Progress Notes (Signed)
[facility] MD Progress Note  05/22/2021 11:49 AM Larry Cook  MRN:  [id number]  CC: Follow-up for schizophrenia  Subjective:  60 year old male brought to emergency room by his sister due to worsening mental health, increased aggression, and poor self care. No acute events overnight, medication compliant, attending to ADLs. Patient received Invega Sustenna 78 mg IM injection today, next dose due 06/22/21. He continues to be mostly nonverbal, but is awake and alert today. He smiles and returns wave.   Contacted his sister at sister, [phone]. Updated on progress and current medications. She is able to pick patient up at noon tomorrow to return home.  Principal Problem: AMS (altered mental status) Diagnosis: Principal Problem:   AMS (altered mental status) Active Problems:   Schizophrenia (HCC)   Asthma   Hypertension   Dyslipidemia   GERD (gastroesophageal reflux disease)   CKD (chronic kidney disease), stage III (HCC)   Diabetes mellitus type 2, uncomplicated (HCC)  Total Time spent with patient: 20 minutes  Past Psychiatric History: See H&P  Past Medical History:  Past Medical History:  Diagnosis Date   Diabetes mellitus without complication (HCC)    Hypertension    Mentally disabled    Schizophrenia (HCC)    History reviewed. No pertinent surgical history. Family History: History reviewed. No pertinent family history. Family Psychiatric  History: See H&P Social History:  Social History   Substance and Sexual Activity  Alcohol Use No     Social History   Substance and Sexual Activity  Drug Use No    Social History   Socioeconomic History   Marital status: Single    Spouse name: Not on file   Number of children: Not on file   Years of education: Not on file   Highest education level: Not on file  Occupational History   Not on file  Tobacco Use   Smoking status: Never   Smokeless tobacco: Never  Substance and Sexual Activity   Alcohol use: No   Drug use: No    Sexual activity: Not on file  Other Topics Concern   Not on file  Social History Narrative   Not on file   Social Determinants of Health   Financial Resource Strain: Not on file  Food Insecurity: Not on file  Transportation Needs: Not on file  Physical Activity: Not on file  Stress: Not on file  Social Connections: Not on file   Additional Social History:                         Sleep: Poor  Appetite:  Good  Current Medications: Current Facility-Administered Medications  Medication Dose Route Frequency Provider Last Rate Last Admin   acetaminophen (TYLENOL) tablet 650 mg  650 mg Oral Q6H PRN [name], [name], MD       albuterol (PROVENTIL) (2.5 MG/3ML) 0.083% nebulizer solution 2.5 mg  2.5 mg Inhalation Q2H PRN Charlsie Quest, MD       albuterol (VENTOLIN HFA) 108 (90 Base) MCG/ACT inhaler 2 puff  2 puff Inhalation Q4H PRN Jesse Sans, MD   2 puff at 05/22/21 0745   alum & mag hydroxide-simeth (MAALOX/MYLANTA) 200-200-20 MG/5ML suspension 30 mL  30 mL Oral Q4H PRN [name], [name], MD       amLODipine (NORVASC) tablet 5 mg  5 mg Oral Daily [name], Jackquline Denmark, MD   5 mg at 05/22/21 2458   aspirin EC tablet 81 mg  81 mg Oral  Daily [name], Jackquline Denmark, MD   81 mg at 05/22/21 0742   glipiZIDE (GLUCOTROL) tablet 5 mg  5 mg Oral QAC breakfast Jesse Sans, MD   5 mg at 05/22/21 2778   hydrOXYzine (ATARAX/VISTARIL) tablet 50 mg  50 mg Oral Q6H PRN [name], [name], MD       insulin aspart (novoLOG) injection 0-15 Units  0-15 Units Subcutaneous TID WC Beers, Sherlynn Carbon, RPH   8 Units at 05/22/21 1131   Ipratropium-Albuterol (COMBIVENT) respimat 1 puff  1 puff Inhalation Q6H Jesse Sans, MD   1 puff at 05/22/21 1028   magnesium hydroxide (MILK OF MAGNESIA) suspension 30 mL  30 mL Oral Daily PRN [name], Jackquline Denmark, MD       paliperidone (INVEGA SUSTENNA) injection 78 mg  78 mg Intramuscular Q28 days Jesse Sans, MD   78 mg at 05/22/21 1053   paliperidone (INVEGA) 24 hr  tablet 3 mg  3 mg Oral QHS [name], [name], MD   3 mg at 05/21/21 2134   pantoprazole (PROTONIX) EC tablet 40 mg  40 mg Oral Daily [name], Jackquline Denmark, MD   40 mg at 05/22/21 2423   pramipexole (MIRAPEX) tablet 0.25 mg  0.25 mg Oral QHS [name], [name], MD   0.25 mg at 05/21/21 2134   pravastatin (PRAVACHOL) tablet 20 mg  20 mg Oral q1800 [name], Jackquline Denmark, MD   20 mg at 05/21/21 2134    Lab Results:  No results found for this or any previous visit (from the past 48 hour(s)).   Blood Alcohol level:  Lab Results  Component Value Date   ETH <10 05/16/2021    Metabolic Disorder Labs: Lab Results  Component Value Date   HGBA1C 10.9 (H) 05/16/2021   MPG 266 05/16/2021   No results found for: PROLACTIN Lab Results  Component Value Date   CHOL 181 05/18/2021   TRIG 146 05/18/2021   HDL 68 05/18/2021   CHOLHDL 2.7 05/18/2021   VLDL 29 05/18/2021   LDLCALC 84 05/18/2021   LDLCALC 77 05/16/2021    Physical Findings: AIMS: Facial and Oral Movements Muscles of Facial Expression: Minimal Lips and Perioral Area: Moderate Jaw: Moderate Tongue: Minimal,Extremity Movements Upper (arms, wrists, hands, fingers): None, normal Lower (legs, knees, ankles, toes): Mild, Trunk Movements Neck, shoulders, hips: None, normal, Overall Severity Severity of abnormal movements (highest score from questions above): Moderate Incapacitation due to abnormal movements: None, normal Patient's awareness of abnormal movements (rate only patient's report): No Awareness, Dental Status Current problems with teeth and/or dentures?: No Does patient usually wear dentures?: No  CIWA:    COWS:     Musculoskeletal: Strength & Muscle Tone: within normal limits Gait & Station: normal Patient leans: N/A  Psychiatric Specialty Exam:  Presentation  General Appearance: Casual  Eye Contact:Good Speech:Mute Speech Volume:N/A Handedness:Right   Mood and Affect  Mood:Euthymic Affect:Congruent  Thought Process   Thought Processes:Unknown Descriptions of Associations:Loose  Orientation:Full (Time, Place and Person)  Thought Content:Unknown History of Schizophrenia/Schizoaffective disorder:Yes  Duration of Psychotic Symptoms:Greater than six months  Hallucinations:Unknown  Ideas of Reference:Paranoia  Suicidal Thoughts:Unknown  Homicidal Thoughts:Unknown   Sensorium  Memory:Immediate Poor; Recent Poor; Remote Poor  Judgment:Impaired  Insight:Poor   Executive Functions  Concentration:Poor  Attention Span:Poor  Recall:Poor  Fund of Knowledge:Poor  Language:Poor   Psychomotor Activity  Psychomotor Activity: Normal   Assets  Assets:Financial Resources/Insurance; Housing   Sleep  Sleep:Fair    Physical Exam: Physical Exam ROS  Blood pressure (!) 147/75, pulse 89, temperature (!) 97.5 F (36.4 C), temperature source Oral, resp. rate 18, height 4' (1.219 m), weight 95.3 kg, SpO2 100 %. Body mass index is 64.11 kg/m.   Treatment Plan Summary: Daily contact with patient to assess and evaluate symptoms and progress in treatment and Medication management Patient awake and alert once more, and appears to be back to baseline. Sister able to pick him up and take him home tomorrow at 12pm.    Schizophrenia - Invega 3 mg daily,  Invega Sustenna 78 mg IM injection given today   HTN-  controlled - Amlodipine 5 mg daily, ASA 81 mg   Diabetes Mellitus Type 2 - Metformin 1000 mg BID with meals, glipizide 5 mg with breakfast, carb modified diet - CBG with meals and bedtime to monitor    Restless Leg Syndrome - Mirapex 0.25 mg QHS   GERD - Protonix 40 mg daily   HLD - Pravastatin 20 mg daily   Jesse Sans, MD 05/22/2021, 11:49 AM

## 2021-05-22 NOTE — Progress Notes (Signed)
D: Pt alert and oriented. Pt does not endorse nor deny experiencing any anxiety/depression at this time. Pt does not report or deny experiencing any pain at this time. Pt does not deny or reports experiencing any SI/HI, or AVH at this time.   Pt is hard of hearing and does better with gestures and reading lips. Pt does not speak much if any.   A: Scheduled medications administered to pt, per MD orders. Support and encouragement provided. Frequent verbal contact made. Routine safety checks conducted q15 minutes.   R: No adverse drug reactions noted. Pt verbally contracts for safety at this time. Pt complaint with medications. Pt interacts well with others on the unit. Pt remains safe at this time. Will continue to monitor.

## 2021-05-22 NOTE — Progress Notes (Signed)
Recreation Therapy Notes  Date: 05/22/2021  Time: 10:30 am   Location: Craft room   Behavioral response: N/A   Intervention Topic: Decision Making     Discussion/Intervention: Patient did not attend group.   Clinical Observations/Feedback:  Patient did not attend group.   Marili Vader LRT/CTRS        Mica Ramdass 05/22/2021 12:34 PM

## 2021-05-23 LAB — GLUCOSE, CAPILLARY
Glucose-Capillary: 158 mg/dL — ABNORMAL HIGH (ref 70–99)
Glucose-Capillary: 170 mg/dL — ABNORMAL HIGH (ref 70–99)
Glucose-Capillary: 213 mg/dL — ABNORMAL HIGH (ref 70–99)

## 2021-05-23 MED ORDER — PALIPERIDONE ER 3 MG PO TB24: 3.0000 mg | ORAL_TABLET | Freq: Every day | ORAL | 1 refills | Status: AC

## 2021-05-23 MED ORDER — PALIPERIDONE PALMITATE ER 156 MG/ML IM SUSY: 78.0000 mg | PREFILLED_SYRINGE | INTRAMUSCULAR | 3 refills | Status: AC

## 2021-05-23 NOTE — Progress Notes (Signed)
Patient verbalized understanding of discharge instructions by nodding head gestures.Returned all belongings from locker. Patient escorted out by staff and transported by family.

## 2021-05-23 NOTE — B[facility] Counselor (Signed)
CSW contacted [facility] regarding continued aftercare. CSW was informed that pt had not been seen there since July 2021 (for injections) and would need to come in as a walk-in to re-establish services. Pt/family to be informed that walk-in hours are Monday, Thursday, and Friday; 9am-3pm with office being closed 1pm-2pm for lunch. CSW agreed. Scheduler inquired regarding pt's insurance and was given the name. No other concerns expressed. Contact ended without incident.   Vilma Meckel. Algis Greenhouse, MSW, LCSW, LCAS 05/23/2021 9:41 AM

## 2021-05-23 NOTE — Progress Notes (Signed)
  [facility] Adult Case Management Discharge Plan :  Will you be returning to the same living situation after discharge:  Yes,  pt to return home. At discharge, do you have transportation home?: Yes,  pt sister to provide transportation. Do you have the ability to pay for your medications: Yes,  [employer].  Release of information consent forms completed and in the chart;  Patient's signature needed at discharge.  Patient to Follow up at:  Follow-up Information     [facility] Follow up.   Why: You can present as a walk-in on Monday, Thursday, or Friday, 9a,-3pm. The office is closed from 1pm-2pm for lunch. Thanks! Contact information: [address] [place] [address] [phone]                 Next level of care provider has access to [facility] Link:no  Safety Planning and Suicide Prevention discussed: Yes,  SPE pamphlet given to pt for familial/pt review.     Has patient been referred to the Quitline?: Patient refused referral  Patient has been referred for addiction treatment: N/A  [name], LCSW 05/23/2021, 10:28 AM

## 2021-05-23 NOTE — Progress Notes (Signed)
Patient is quiet and reserved. He is hard of hearing so communication is minimal. He does answer with one word answers and gestures.  He is med compliant and no behavior issues to note. He is safe on the unit with Q 15 minute safety rounds.  Will continue to monitor for status change.    Cleo Butler-Nicholson,LPN

## 2021-05-23 NOTE — Progress Notes (Signed)
Recreation Therapy Notes  INPATIENT RECREATION TR PLAN  Patient Details Name: Larry Cook MRN: [id number] DOB: [birth year] Today's Date: 05/23/2021  Rec Therapy Plan Is patient appropriate for Therapeutic Recreation?: Yes Treatment times per week: at least 3 Estimated Length of Stay: 5-7 days TR Treatment/Interventions: Group participation (Comment)  Discharge Criteria Pt will be discharged from therapy if:: Discharged Treatment plan/goals/alternatives discussed and agreed upon by:: Patient/family  Discharge Summary Short term goals set: Patient will engage in groups without prompting or encouragement from LRT x3 group sessions within 5 recreation therapy group sessions Short term goals met: Not met Reason goals not met: Patient spent his time in his room Therapeutic equipment acquired: N/A Reason patient discharged from therapy: Discharge from hospital Pt/family agrees with progress & goals achieved: Yes Date patient discharged from therapy: 05/23/21   Calene Paradiso 05/23/2021, 2:07 PM

## 2021-05-23 NOTE — Discharge Summary (Signed)
Physician Discharge Summary Note  Patient:  Larry Cook is an 60 y.o., male MRN:  [id number] DOB:  05-21-1961 Patient phone:  [phone] (home)  Patient address:   [address] [place] [address],  Total Time spent with patient: 35 minutes- 25 minutes face-to-face contact with patient, 10 minutes documentation, coordination of care, scripts   Date of Admission:  05/16/2021 Date of Discharge: 05/23/2021  Reason for Admission:  60 year old male brought to emergency room by his sister due to worsening mental health, increased aggression, and poor self care.  Principal Problem: Schizophrenia [facility]) Discharge Diagnoses: Principal Problem:   Schizophrenia (HCC) Active Problems:   Asthma   Hypertension   Dyslipidemia   GERD (gastroesophageal reflux disease)   CKD (chronic kidney disease), stage III (HCC)   Diabetes mellitus type 2, uncomplicated (HCC)   Past Psychiatric History:  History of schizophrenia. Sister says that many years ago he had been in hospitals but cannot remember when.  It looks like in the past he used to receive Risperdal Consta 50 mg every 2 weeks but at some point that was discontinued.  Sister says he just does not get it anymore there is no particular reason why.  Only psychiatric medicine right now is Xanax 1 mg 3 times a day provided by Dr. Judithann Sheen.  Past Medical History:  Past Medical History:  Diagnosis Date   Diabetes mellitus without complication (HCC)    Hypertension    Mentally disabled    Schizophrenia (HCC)    History reviewed. No pertinent surgical history. Family History: History reviewed. No pertinent family history. Family Psychiatric  History: None reported Social History:  Social History   Substance and Sexual Activity  Alcohol Use No     Social History   Substance and Sexual Activity  Drug Use No    Social History   Socioeconomic History   Marital status: Single    Spouse name: Not on file   Number of children: Not  on file   Years of education: Not on file   Highest education level: Not on file  Occupational History   Not on file  Tobacco Use   Smoking status: Never   Smokeless tobacco: Never  Substance and Sexual Activity   Alcohol use: No   Drug use: No   Sexual activity: Not on file  Other Topics Concern   Not on file  Social History Narrative   Not on file   Social Determinants of Health   Financial Resource Strain: Not on file  Food Insecurity: Not on file  Transportation Needs: Not on file  Physical Activity: Not on file  Stress: Not on file  Social Connections: Not on file    Hospital Course:  60 year old male brought to emergency room by his sister due to worsening mental health, increased aggression, and poor self care. While here he was restarted on home medications and also placed on Invega 3 mg QHS. Over the course of the hospital stay patient became oversedated and was unsteady on his feet. Ativan, gabapentin, and Norco were discontinued, and patient improved. He was able to tolerate Invega well, and received Invega Sustenna 78 mg IM injectio non 05/22/21. Next injection due 06/22/21. While here he did not exhibit any aggression. He denies SI/HI/AH/VH. His sister was contacted prior to discharge, and will be able to pick him up today. She requests that he follow-up at [facility] where he was seen previously.   Physical Findings: AIMS: Facial and Oral Movements Muscles  of Facial Expression: Minimal Lips and Perioral Area: Moderate Jaw: Moderate Tongue: Minimal,Extremity Movements Upper (arms, wrists, hands, fingers): None, normal Lower (legs, knees, ankles, toes): Mild, Trunk Movements Neck, shoulders, hips: None, normal, Overall Severity Severity of abnormal movements (highest score from questions above): Moderate Incapacitation due to abnormal movements: None, normal Patient's awareness of abnormal movements (rate only patient's report): No Awareness, Dental  Status Current problems with teeth and/or dentures?: No Does patient usually wear dentures?: No  CIWA:    COWS:     Musculoskeletal: Strength & Muscle Tone: within normal limits Gait & Station: normal Patient leans: N/A   Psychiatric Specialty Exam:  Presentation  General Appearance: Casual; Appropriate for Environment  Eye Contact:Fair  Speech:Other (comment) (Largely mute, with occasional one word response)  Speech Volume:Normal  Handedness:Right   Mood and Affect  Mood:Euthymic  Affect:Congruent   Thought Process  Thought Processes:Goal Directed  Descriptions of Associations:Intact  Orientation:Full (Time, Place and Person)  Thought Content:Logical  History of Schizophrenia/Schizoaffective disorder:Yes  Duration of Psychotic Symptoms:Greater than six months  Hallucinations:Hallucinations: None  Ideas of Reference:None  Suicidal Thoughts:Suicidal Thoughts: No  Homicidal Thoughts:Homicidal Thoughts: No   Sensorium  Memory:Immediate Poor; Recent Poor; Remote Poor  Judgment:Intact  Insight:Shallow   Executive Functions  Concentration:Fair  Attention Span:Fair  Recall:Poor  Fund of Knowledge:Poor  Language:Poor   Psychomotor Activity  Psychomotor Activity:Psychomotor Activity: Extrapyramidal Side Effects (EPS) Extrapyramidal Side Effects (EPS): Tardive Dyskinesia AIMS Completed?: Yes   Assets  Assets:Desire for Improvement; Financial Resources/Insurance; Housing; Resilience; Social Support   Sleep  Sleep:Sleep: Fair    Physical Exam: Physical Exam Vitals and nursing note reviewed.  Constitutional:      Appearance: Normal appearance.  HENT:     Head: Normocephalic and atraumatic.     Right Ear: External ear normal.     Left Ear: External ear normal.     Nose: Nose normal.     Mouth/Throat:     Mouth: Mucous membranes are moist.     Pharynx: Oropharynx is clear.  Eyes:     Extraocular Movements: Extraocular movements  intact.     Conjunctiva/sclera: Conjunctivae normal.     Pupils: Pupils are equal, round, and reactive to light.  Cardiovascular:     Rate and Rhythm: Normal rate.     Pulses: Normal pulses.  Pulmonary:     Effort: Pulmonary effort is normal.     Breath sounds: Normal breath sounds.  Abdominal:     General: Abdomen is flat.     Palpations: Abdomen is soft.  Musculoskeletal:        General: No swelling. Normal range of motion.     Cervical back: Normal range of motion and neck supple.  Skin:    General: Skin is warm and dry.  Neurological:     General: No focal deficit present.     Mental Status: He is alert.     Cranial Nerves: No cranial nerve deficit.  Psychiatric:        Mood and Affect: Mood normal.        Behavior: Behavior normal.        Thought Content: Thought content normal.        Judgment: Judgment normal.   Review of Systems  Constitutional: Negative.   HENT: Negative.    Eyes: Negative.   Respiratory: Negative.    Cardiovascular: Negative.   Gastrointestinal: Negative.   Genitourinary: Negative.   Musculoskeletal: Negative.   Skin: Negative.   Neurological: Negative.   Endo/Heme/Allergies:  Positive for environmental allergies. Does not bruise/bleed easily.  Psychiatric/Behavioral:  Negative for hallucinations and suicidal ideas. The patient does not have insomnia.   Blood pressure 128/82, pulse 92, temperature (!) 97.5 F (36.4 C), temperature source Oral, resp. rate 17, height 4' (1.219 m), weight 95.3 kg, SpO2 99 %. Body mass index is 64.11 kg/m.   Social History   Tobacco Use  Smoking Status Never  Smokeless Tobacco Never   Tobacco Cessation:  N/A, patient does not currently use tobacco products   Blood Alcohol level:  Lab Results  Component Value Date   ETH <10 05/16/2021    Metabolic Disorder Labs:  Lab Results  Component Value Date   HGBA1C 10.9 (H) 05/16/2021   MPG 266 05/16/2021   No results found for: PROLACTIN Lab Results   Component Value Date   CHOL 181 05/18/2021   TRIG 146 05/18/2021   HDL 68 05/18/2021   CHOLHDL 2.7 05/18/2021   VLDL 29 05/18/2021   LDLCALC 84 05/18/2021   LDLCALC 77 05/16/2021    See Psychiatric Specialty Exam and Suicide Risk Assessment completed by Attending Physician prior to discharge.  Discharge destination:  Home  Is patient on multiple antipsychotic therapies at discharge:  No   Has Patient had three or more failed trials of antipsychotic monotherapy by history:  No  Recommended Plan for Multiple Antipsychotic Therapies: NA  Discharge Instructions     Diet - low sodium heart healthy   Complete by: As directed    Increase activity slowly   Complete by: As directed       Allergies as of 05/23/2021       Reactions   Acetaminophen-codeine    Other reaction(s): Unknown        Medication List     STOP taking these medications    ALPRAZolam 1 MG tablet Commonly known as: XANAX   gabapentin 100 MG capsule Commonly known as: NEURONTIN   HYDROcodone-acetaminophen 10-325 MG tablet Commonly known as: NORCO       TAKE these medications      Indication  amLODipine 5 MG tablet Commonly known as: NORVASC Take 5 mg by mouth daily.  Indication: High Blood Pressure Disorder   aspirin EC 81 MG tablet Take 81 mg by mouth daily.  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  Indication: Type 2 Diabetes   lovastatin 40 MG tablet Commonly known as: MEVACOR Take 40 mg by mouth daily with supper.  Indication: CORONARY ARTERY DISEASE   metFORMIN 1000 MG tablet Commonly known as: GLUCOPHAGE Take 1,000 mg by mouth 2 (two) times daily.  Indication: Type 2 Diabetes   omeprazole 20 MG capsule Commonly known as: PRILOSEC Take 20 mg by mouth 2 (two) times daily.  Indication: Gastroesophageal Reflux Disease   paliperidone  156 MG/ML Susy injection Commonly known as: INVEGA SUSTENNA Inject 0.5 mLs (78 mg total) into the muscle every 28 (twenty-eight) days. Start taking on: June 19, 2021  Indication: Schizophrenia   paliperidone 3 MG 24 hr tablet Commonly known as: INVEGA Take 1 tablet (3 mg total) by mouth at bedtime.  Indication: Schizophrenia   pramipexole 0.5 MG tablet Commonly known as: MIRAPEX Take 0.5 mg by mouth 3 (three) times daily.  Indication: Restless Leg Syndrome   Symbicort 160-4.5 MCG/ACT inhaler Generic drug: budesonide-formoterol Inhale 2 puffs into the lungs 2 (two) times daily.  Indication: Chronic Obstructive Lung Disease         Follow-up recommendations:  Activity:  as tolerated Diet:  carb modified  Comments:  30-day scripts with 1 refill for Invega oral and LAI sent to [facility] in [place] per family request. If suicidal or homicidal ideations occur call 911 or proceed to nearest emergency room.   Signed: Jesse Sans, MD 05/23/2021, 8:57 AM

## 2021-05-23 NOTE — Plan of Care (Signed)
  Problem: Education: Goal: Knowledge of [facility] Education information/materials will improve Outcome: Not Progressing Goal: Emotional status will improve Outcome: Progressing Goal: Mental status will improve Outcome: Progressing Goal: Verbalization of understanding the information provided will improve Outcome: Not Progressing   Problem: Education: Goal: Will be free of psychotic symptoms Outcome: Progressing Goal: Knowledge of the prescribed therapeutic regimen will improve Outcome: Not Progressing   Problem: Safety: Goal: Ability to redirect hostility and anger into socially appropriate behaviors will improve Outcome: Progressing Goal: Ability to remain free from injury will improve Outcome: Progressing

## 2021-05-23 NOTE — Progress Notes (Signed)
CBG monitoring was not completed on patient at 12am and 4am. Patient has been asleep through the night in no distress. Will complete the CBG between 6:30-6:45 to coincide with his breakfast at 7:30. Since patient is now on sliding scale with meals.    Cleo Butler-Nicholson, LPN

## 2021-05-23 NOTE — Progress Notes (Signed)
Important Message  Patient Details  Name: Larry Cook MRN: [id number] Date of Birth: 11/20/1960   Medicare Important Message Given:  Yes, Patient signed acknowledgement. Patient provided w/ handout w/ [facility] contact information.     05/23/21 1013  Important Message  Medicare important message given? Yes     Corky Crafts, LCSWA 05/23/2021, 10:15 AM

## 2021-05-23 NOTE — [facility] Suicide Risk Assessment (Signed)
[facility] Discharge Suicide Risk Assessment   Principal Problem: AMS (altered mental status) Discharge Diagnoses: Principal Problem:   AMS (altered mental status) Active Problems:   Schizophrenia (HCC)   Asthma   Hypertension   Dyslipidemia   GERD (gastroesophageal reflux disease)   CKD (chronic kidney disease), stage III (HCC)   Diabetes mellitus type 2, uncomplicated (HCC)   Total Time spent with patient: 35 minutes- 25 minutes face-to-face contact with patient, 10 minutes documentation, coordination of care, scripts   Musculoskeletal: Strength & Muscle Tone: within normal limits Gait & Station: normal Patient leans: N/A  Psychiatric Specialty Exam  Presentation  General Appearance: Casual; Appropriate for Environment  Eye Contact:Fair  Speech:Other (comment) (Largely mute, with occasional one word response)  Speech Volume:Normal  Handedness:Right   Mood and Affect  Mood:Euthymic  Duration of Depression Symptoms: No data recorded Affect:Congruent   Thought Process  Thought Processes:Goal Directed  Descriptions of Associations:Intact  Orientation:Full (Time, Place and Person)  Thought Content:Logical  History of Schizophrenia/Schizoaffective disorder:Yes  Duration of Psychotic Symptoms:Greater than six months  Hallucinations:Hallucinations: None  Ideas of Reference:None  Suicidal Thoughts:Suicidal Thoughts: No  Homicidal Thoughts:Homicidal Thoughts: No   Sensorium  Memory:Immediate Poor; Recent Poor; Remote Poor  Judgment:Intact  Insight:Shallow   Executive Functions  Concentration:Fair  Attention Span:Fair  Recall:Poor  Fund of Knowledge:Poor  Language:Poor   Psychomotor Activity  Psychomotor Activity:Psychomotor Activity: Extrapyramidal Side Effects (EPS) Extrapyramidal Side Effects (EPS): Tardive Dyskinesia AIMS Completed?: Yes   Assets  Assets:Desire for Improvement; Financial Resources/Insurance; Housing; Resilience; Social  Support   Sleep  Sleep:Sleep: Fair   Physical Exam: Physical Exam ROS Blood pressure 128/82, pulse 92, temperature (!) 97.5 F (36.4 C), temperature source Oral, resp. rate 17, height 4' (1.219 m), weight 95.3 kg, SpO2 99 %. Body mass index is 64.11 kg/m.  Mental Status Per Nursing Assessment::   On Admission:  NA  Demographic Factors:  Male  Loss Factors: NA  Historical Factors: NA  Risk Reduction Factors:   Sense of responsibility to family, Living with another person, especially a relative, Positive social support, Positive therapeutic relationship, and Positive coping skills or problem solving skills  Continued Clinical Symptoms:  Schizophrenia:   Paranoid or undifferentiated type Previous Psychiatric Diagnoses and Treatments  Cognitive Features That Contribute To Risk:  None    Suicide Risk:  Minimal: No identifiable suicidal ideation.  Patients presenting with no risk factors but with morbid ruminations; may be classified as minimal risk based on the severity of the depressive symptoms    Plan Of Care/Follow-up recommendations:  Activity:  as tolerated Diet:  carb modified  Jesse Sans, MD 05/23/2021, 8:55 AM

## 2021-06-07 ENCOUNTER — Emergency Department
Admission: EM | Admit: 2021-06-07 | Discharge: 2021-06-17 | Disposition: A | Discharge status: Home / Self Care | Payer: Medicare Other | Attending: Emergency Medicine | Admitting: Emergency Medicine

## 2021-06-07 DIAGNOSIS — Z7982 Long term (current) use of aspirin: Secondary | ICD-10-CM | POA: Diagnosis not present

## 2021-06-07 DIAGNOSIS — F29 Unspecified psychosis not due to a substance or known physiological condition: Secondary | ICD-10-CM | POA: Diagnosis not present

## 2021-06-07 DIAGNOSIS — Z7984 Long term (current) use of oral hypoglycemic drugs: Secondary | ICD-10-CM | POA: Diagnosis not present

## 2021-06-07 DIAGNOSIS — J45909 Unspecified asthma, uncomplicated: Secondary | ICD-10-CM | POA: Diagnosis not present

## 2021-06-07 DIAGNOSIS — F209 Schizophrenia, unspecified: Secondary | ICD-10-CM | POA: Insufficient documentation

## 2021-06-07 DIAGNOSIS — Z7951 Long term (current) use of inhaled steroids: Secondary | ICD-10-CM | POA: Diagnosis not present

## 2021-06-07 DIAGNOSIS — Z20822 Contact with and (suspected) exposure to covid-19: Secondary | ICD-10-CM | POA: Diagnosis not present

## 2021-06-07 DIAGNOSIS — N183 Chronic kidney disease, stage 3 unspecified: Secondary | ICD-10-CM | POA: Diagnosis present

## 2021-06-07 DIAGNOSIS — Z046 Encounter for general psychiatric examination, requested by authority: Secondary | ICD-10-CM | POA: Diagnosis present

## 2021-06-07 DIAGNOSIS — F79 Unspecified intellectual disabilities: Secondary | ICD-10-CM | POA: Diagnosis not present

## 2021-06-07 DIAGNOSIS — F840 Autistic disorder: Secondary | ICD-10-CM | POA: Insufficient documentation

## 2021-06-07 DIAGNOSIS — Z794 Long term (current) use of insulin: Secondary | ICD-10-CM | POA: Diagnosis not present

## 2021-06-07 DIAGNOSIS — Z79899 Other long term (current) drug therapy: Secondary | ICD-10-CM | POA: Diagnosis not present

## 2021-06-07 DIAGNOSIS — E119 Type 2 diabetes mellitus without complications: Secondary | ICD-10-CM | POA: Insufficient documentation

## 2021-06-07 DIAGNOSIS — I129 Hypertensive chronic kidney disease with stage 1 through stage 4 chronic kidney disease, or unspecified chronic kidney disease: Secondary | ICD-10-CM | POA: Insufficient documentation

## 2021-06-07 DIAGNOSIS — F203 Undifferentiated schizophrenia: Secondary | ICD-10-CM | POA: Diagnosis present

## 2021-06-07 LAB — COMPREHENSIVE METABOLIC PANEL
ALT: 21 U/L (ref 0–44)
AST: 23 U/L (ref 15–41)
Albumin: 3.7 g/dL (ref 3.5–5.0)
Alkaline Phosphatase: 140 U/L — ABNORMAL HIGH (ref 38–126)
Anion gap: 4 — ABNORMAL LOW (ref 5–15)
BUN: 24 mg/dL — ABNORMAL HIGH (ref 6–20)
CO2: 31 mmol/L (ref 22–32)
Calcium: 9.2 mg/dL (ref 8.9–10.3)
Chloride: 99 mmol/L (ref 98–111)
Creatinine, Ser: 1.29 mg/dL — ABNORMAL HIGH (ref 0.61–1.24)
GFR, Estimated: >60 mL/min (ref >60)
Glucose, Bld: 351 mg/dL — ABNORMAL HIGH (ref 70–99)
Potassium: 3.8 mmol/L (ref 3.5–5.1)
Sodium: 134 mmol/L — ABNORMAL LOW (ref 135–145)
Total Bilirubin: 0.5 mg/dL (ref 0.3–1.2)
Total Protein: 8.1 g/dL (ref 6.5–8.1)

## 2021-06-07 LAB — CBC
HCT: 40.3 % (ref 39.0–52.0)
Hemoglobin: 13.2 g/dL (ref 13.0–17.0)
MCH: 29.4 pg (ref 26.0–34.0)
MCHC: 32.8 g/dL (ref 30.0–36.0)
MCV: 89.8 fL (ref 80.0–100.0)
Platelets: 281 10*3/uL (ref 150–400)
RBC: 4.49 MIL/uL (ref 4.22–5.81)
RDW: 14.9 % (ref 11.5–15.5)
WBC: 7.5 10*3/uL (ref 4.0–10.5)
nRBC: 0 % (ref 0.0–0.2)

## 2021-06-07 LAB — URINE DRUG SCREEN, QUALITATIVE (ARMC ONLY)
Amphetamines, Ur Screen: NOT DETECTED
Barbiturates, Ur Screen: NOT DETECTED
Benzodiazepine, Ur Scrn: NOT DETECTED
Cannabinoid 50 Ng, Ur ~~LOC~~: NOT DETECTED
Cocaine Metabolite,Ur ~~LOC~~: NOT DETECTED
MDMA (Ecstasy)Ur Screen: NOT DETECTED
Methadone Scn, Ur: NOT DETECTED
Opiate, Ur Screen: NOT DETECTED
Phencyclidine (PCP) Ur S: NOT DETECTED
Tricyclic, Ur Screen: NOT DETECTED

## 2021-06-07 LAB — ACETAMINOPHEN LEVEL: Acetaminophen (Tylenol), Serum: <10 ug/mL — ABNORMAL LOW (ref 10–30)

## 2021-06-07 LAB — ETHANOL: Alcohol, Ethyl (B): <10 mg/dL (ref <10)

## 2021-06-07 LAB — SALICYLATE LEVEL: Salicylate Lvl: <7 mg/dL — ABNORMAL LOW (ref 7.0–30.0)

## 2021-06-07 LAB — CBG MONITORING, ED: Glucose-Capillary: 353 mg/dL — ABNORMAL HIGH (ref 70–99)

## 2021-06-07 MED ORDER — INSULIN ASPART 100 UNIT/ML IJ SOLN
0.0000 [IU] | Freq: Three times a day (TID) | INTRAMUSCULAR | Status: DC
  Administered 2021-06-08: 11 [IU] via SUBCUTANEOUS
  Administered 2021-06-08: 8 [IU] via SUBCUTANEOUS
  Administered 2021-06-09: 11 [IU] via SUBCUTANEOUS
  Administered 2021-06-09: 5 [IU] via SUBCUTANEOUS
  Administered 2021-06-09: 3 [IU] via SUBCUTANEOUS
  Administered 2021-06-10: 8 [IU] via SUBCUTANEOUS
  Administered 2021-06-10: 5 [IU] via SUBCUTANEOUS
  Administered 2021-06-10: 11 [IU] via SUBCUTANEOUS
  Administered 2021-06-11: 8 [IU] via SUBCUTANEOUS
  Administered 2021-06-11: 5 [IU] via SUBCUTANEOUS
  Administered 2021-06-11: 8 [IU] via SUBCUTANEOUS
  Administered 2021-06-12 (×2): 5 [IU] via SUBCUTANEOUS
  Administered 2021-06-12 – 2021-06-13 (×3): 3 [IU] via SUBCUTANEOUS
  Administered 2021-06-13: 5 [IU] via SUBCUTANEOUS
  Administered 2021-06-14 (×2): 3 [IU] via SUBCUTANEOUS
  Administered 2021-06-14 – 2021-06-15 (×3): 2 [IU] via SUBCUTANEOUS
  Administered 2021-06-15 – 2021-06-16 (×2): 3 [IU] via SUBCUTANEOUS
  Administered 2021-06-16: 5 [IU] via SUBCUTANEOUS
  Administered 2021-06-17: 3 [IU] via SUBCUTANEOUS
  Filled 2021-06-07 (×21): qty 1

## 2021-06-07 MED ORDER — INSULIN ASPART 100 UNIT/ML IJ SOLN
0.0000 [IU] | Freq: Every day | INTRAMUSCULAR | Status: DC
  Administered 2021-06-08 (×2): 5 [IU] via SUBCUTANEOUS
  Administered 2021-06-09: 4 [IU] via SUBCUTANEOUS
  Administered 2021-06-12: 2 [IU] via SUBCUTANEOUS
  Administered 2021-06-13: 3 [IU] via SUBCUTANEOUS
  Administered 2021-06-14 – 2021-06-15 (×2): 2 [IU] via SUBCUTANEOUS
  Administered 2021-06-16: 3 [IU] via SUBCUTANEOUS
  Filled 2021-06-07 (×8): qty 1

## 2021-06-07 NOTE — ED Notes (Signed)
The following items placed in one of one labeled bag: blue sneakers, blue socks, grey pants, blue t shirt, brown wallet, black band watch, wrench, underwear.

## 2021-06-07 NOTE — ED Notes (Signed)
Patient transferred from Triage to room 23 after dressing out and screening for contraband. Pt oriented to [employer] including Q15 minute rounds as well as Psychologist, counselling for their protection. Patient is alert and oriented, warm and dry in no acute distress.  Pt. Encouraged to let this nurse know if needs arise.
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
## 2021-06-07 NOTE — ED Notes (Signed)
Pt to room with officers. Pt is cooperative with this nurse, hard of hearing. Pt answers "yes" to every question that this nurse asks. Pt was asked by this nurse if he was having SI or HI and each time pt only responds yes, even before this was asked. Pt was then asked by this nurse if he picks his nose at night pt states "yes". Pt is not able to hold conversation with staff and is resting in bed. Will continue to monitor.
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
## 2021-06-08 DIAGNOSIS — F209 Schizophrenia, unspecified: Secondary | ICD-10-CM | POA: Diagnosis not present

## 2021-06-08 LAB — CBG MONITORING, ED
Glucose-Capillary: 339 mg/dL — ABNORMAL HIGH (ref 70–99)
Glucose-Capillary: 330 mg/dL — ABNORMAL HIGH (ref 70–99)
Glucose-Capillary: 275 mg/dL — ABNORMAL HIGH (ref 70–99)
Glucose-Capillary: 360 mg/dL — ABNORMAL HIGH (ref 70–99)

## 2021-06-08 LAB — RESP PANEL BY RT-PCR (FLU A&B, COVID) ARPGX2
Influenza A by PCR: NEGATIVE
Influenza B by PCR: NEGATIVE
SARS Coronavirus 2 by RT PCR: NEGATIVE

## 2021-06-08 MED ORDER — AMLODIPINE BESYLATE 5 MG PO TABS
5.0000 mg | ORAL_TABLET | Freq: Every day | ORAL | Status: DC
  Administered 2021-06-08 – 2021-06-09 (×2): 5 mg via ORAL
  Filled 2021-06-08 (×2): qty 1

## 2021-06-08 MED ORDER — PALIPERIDONE ER 3 MG PO TB24
3.0000 mg | ORAL_TABLET | Freq: Every day | ORAL | Status: DC
  Administered 2021-06-08 – 2021-06-09 (×2): 3 mg via ORAL
  Filled 2021-06-08 (×2): qty 1

## 2021-06-08 NOTE — ED Notes (Signed)
Pt is asleep

## 2021-06-08 NOTE — ED Notes (Signed)
IVC, pending consult 

## 2021-06-08 NOTE — ED Notes (Signed)
IVC  CONSULT  DONE  PENDING  PLACEMENT 

## 2021-06-08 NOTE — ED Notes (Signed)
Pt is sitting on the side of the bed.

## 2021-06-08 NOTE — ED Notes (Signed)
 checks sheet place in pt's chart and sent to [facility] RN. Pt in wheelchair; awaiting security then pt will go to [facility].

## 2021-06-08 NOTE — ED Notes (Signed)
Pt is using restroom  
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
## 2021-06-08 NOTE — ED Notes (Signed)
Pt is awake and lying down.

## 2021-06-08 NOTE — ED Notes (Signed)
Pt was provided snack and drink 
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
## 2021-06-08 NOTE — ED Notes (Signed)
Pt awake in room and can be heard making unusual noises. When this nurse and Tilden Dome check on pt he is sitting on side of bed laughing. Denies needs. Will continue to monitor.

## 2021-06-08 NOTE — ED Notes (Signed)
APS staff here to see pt. Pt agreeable to have them visit; allowed.

## 2021-06-08 NOTE — ED Notes (Signed)
Pt. Alert and oriented, warm and dry, in no distress. Unable to do psych assessment due to patient non verbal.  Pt. Encouraged to let nursing staff know of any concerns or needs.   ENVIRONMENTAL ASSESSMENT Potentially harmful objects out of patient reach: Yes.   Personal belongings secured: Yes.   Patient dressed in hospital provided attire only: Yes.   Plastic bags out of patient reach: Yes.   Patient care equipment (cords, cables, call bells, lines, and drains) shortened, removed, or accounted for: Yes.   Equipment and supplies removed from bottom of stretcher: Yes.   Potentially toxic materials out of patient reach: Yes.   Sharps container removed or out of patient reach: Yes.

## 2021-06-08 NOTE — ED Notes (Signed)
Pt has dinner tray. 

## 2021-06-08 NOTE — Progress Notes (Signed)
Inpatient Diabetes Program Recommendations  AACE/ADA: New Consensus Statement on Inpatient Glycemic Control (2015)  Target Ranges:  Prepandial:   less than 140 mg/dL      Peak postprandial:   less than 180 mg/dL (1-2 hours)      Critically ill patients:  140 - 180 mg/dL   Lab Results  Component Value Date   GLUCAP 330 (H) 06/08/2021   HGBA1C 10.9 (H) 05/16/2021    Review of Glycemic Control Results for Larry Cook, Larry Cook (MRN [id number]) as of 06/08/2021 12:46  Ref. Range 06/07/2021 23:55 06/08/2021 09:33 06/08/2021 12:12  Glucose-Capillary Latest Ref Range: 70 - 99 mg/dL 919 (H) 166 (H) 060 (H)   Diabetes history: DM2 Outpatient Diabetes medications: Lantus 20 units, Metformin 1 gm bid Current orders for Inpatient glycemic control: Novolog correction 0-15 units tid + hs 0-5 units  Inpatient Diabetes Program Recommendations:   Add Semglee 20 units qd  Thank you, Larry Cook. Larry Willers, RN, MSN, CDE  Diabetes Coordinator Inpatient Glycemic Control Team Team Pager [phone] (8am-5pm) 06/08/2021 12:48 PM

## 2021-06-09 DIAGNOSIS — F209 Schizophrenia, unspecified: Secondary | ICD-10-CM | POA: Diagnosis not present

## 2021-06-09 LAB — CBG MONITORING, ED
Glucose-Capillary: 223 mg/dL — ABNORMAL HIGH (ref 70–99)
Glucose-Capillary: 318 mg/dL — ABNORMAL HIGH (ref 70–99)
Glucose-Capillary: 194 mg/dL — ABNORMAL HIGH (ref 70–99)
Glucose-Capillary: 313 mg/dL — ABNORMAL HIGH (ref 70–99)

## 2021-06-09 NOTE — ED Notes (Signed)
Pharmacy called earlier to attempt to contact sister to complete a  medication reconcilliation, per pharmacy will try to contact sister for med rec.

## 2021-06-09 NOTE — ED Notes (Signed)
IVC/Pending Placement 

## 2021-06-09 NOTE — BH Assessment (Signed)
Referral information for Psychiatric Hospitalization faxed to;   [facility] ([phone]-or- [phone]),   [facility] [phone] -or- [phone])   [facility] ([phone]),  [facility] ([phone] or [phone])  [facility] ([phone]),   [facility] ([phone] -or- [phone]),   [facility] ([phone]).  [facility] ([phone]) 

## 2021-06-09 NOTE — ED Notes (Signed)
Call placed to velvet watlington, pt's sister at phone number provided in chart,to attempt to secure home medications prescribed list. No answer and machine full at this time, not able to leave message.

## 2021-06-09 NOTE — ED Notes (Signed)
IVC/  PENDING  PLACEMENT 

## 2021-06-09 NOTE — ED Notes (Signed)
Patient sitting up in the chair eating dinner tray.

## 2021-06-09 NOTE — ED Notes (Signed)
Report from dorothy, rn.

## 2021-06-09 NOTE — Progress Notes (Signed)
Inpatient Diabetes Program Recommendations  AACE/ADA: New Consensus Statement on Inpatient Glycemic Control (2015)  Target Ranges:  Prepandial:   less than 140 mg/dL      Peak postprandial:   less than 180 mg/dL (1-2 hours)      Critically ill patients:  140 - 180 mg/dL   Lab Results  Component Value Date   GLUCAP 223 (H) 06/09/2021   HGBA1C 10.9 (H) 05/16/2021    Review of Glycemic Control Results for Larry Cook, Larry Cook (MRN [id number]) as of 06/09/2021 10:56  Ref. Range 06/08/2021 09:33 06/08/2021 12:12 06/08/2021 17:18 06/08/2021 20:28 06/09/2021 08:08  Glucose-Capillary Latest Ref Range: 70 - 99 mg/dL 624 (H) 469 (H) 507 (H) 360 (H) 223 (H)   Diabetes history: DM2 Outpatient Diabetes medications: Lantus 20 units, Metformin 1 gm bid Current orders for Inpatient glycemic control: Novolog correction 0-15 units tid + hs 0-5 units  Inpatient Diabetes Program Recommendations:   Add Semglee 20 units qd  Thanks,  Christena Deem RN, MSN, BC-ADM Inpatient Diabetes Coordinator Team Pager [phone] (8a-5p)

## 2021-06-09 NOTE — ED Provider Notes (Signed)
Emergency Medicine Observation Re-evaluation Note  Larry Cook is a 60 y.o. male, presents for psychiatric evaluation under IVC.  No acute events overnight.  Patient remains under IVC, calm cooperative.  Physical Exam  BP 133/84 (BP Location: Left Arm)   Pulse 83   Temp (!) 97.5 F (36.4 C)   Resp 18   Ht 4\' 2"  (1.27 m)   Wt 94 kg   SpO2 100%   BMI 58.28 kg/m   No acute events.  ED Course / MDM  Recent lab work, no acute or concerning findings. Plan  Current plan is for psychiatric evaluation and disposition. Sohan Potvin is under involuntary commitment.      Winfield Cunas, MD 06/09/21 1506

## 2021-06-10 DIAGNOSIS — F209 Schizophrenia, unspecified: Secondary | ICD-10-CM | POA: Diagnosis not present

## 2021-06-10 LAB — CBG MONITORING, ED
Glucose-Capillary: 236 mg/dL — ABNORMAL HIGH (ref 70–99)
Glucose-Capillary: 292 mg/dL — ABNORMAL HIGH (ref 70–99)
Glucose-Capillary: 312 mg/dL — ABNORMAL HIGH (ref 70–99)
Glucose-Capillary: 106 mg/dL — ABNORMAL HIGH (ref 70–99)

## 2021-06-10 MED ORDER — PANTOPRAZOLE SODIUM 40 MG PO TBEC
40.0000 mg | DELAYED_RELEASE_TABLET | Freq: Every day | ORAL | Status: DC
  Administered 2021-06-10 – 2021-06-17 (×8): 40 mg via ORAL
  Filled 2021-06-10 (×8): qty 1

## 2021-06-10 MED ORDER — INSULIN GLARGINE-YFGN 100 UNIT/ML ~~LOC~~ SOLN: 20.0000 [IU] | Freq: Every day | SUBCUTANEOUS | Status: DC

## 2021-06-10 MED ORDER — ASPIRIN EC 81 MG PO TBEC
81.0000 mg | DELAYED_RELEASE_TABLET | Freq: Every day | ORAL | Status: DC
  Administered 2021-06-10 – 2021-06-17 (×8): 81 mg via ORAL
  Filled 2021-06-10 (×8): qty 1

## 2021-06-10 MED ORDER — INSULIN GLARGINE-YFGN 100 UNIT/ML ~~LOC~~ SOLN
20.0000 [IU] | Freq: Every day | SUBCUTANEOUS | Status: DC
  Administered 2021-06-10 – 2021-06-16 (×7): 20 [IU] via SUBCUTANEOUS
  Filled 2021-06-10 (×9): qty 0.2

## 2021-06-10 MED ORDER — PALIPERIDONE ER 3 MG PO TB24
3.0000 mg | ORAL_TABLET | Freq: Every day | ORAL | Status: DC
  Administered 2021-06-10 – 2021-06-16 (×7): 3 mg via ORAL
  Filled 2021-06-10 (×7): qty 1

## 2021-06-10 MED ORDER — PRAMIPEXOLE DIHYDROCHLORIDE 0.25 MG PO TABS
0.5000 mg | ORAL_TABLET | Freq: Three times a day (TID) | ORAL | Status: DC
Start: 2021-06-10 — End: 2021-06-17
  Administered 2021-06-10 – 2021-06-17 (×20): 0.5 mg via ORAL
  Filled 2021-06-10 (×26): qty 2

## 2021-06-10 MED ORDER — PRAVASTATIN SODIUM 20 MG PO TABS
40.0000 mg | ORAL_TABLET | Freq: Every day | ORAL | Status: DC
  Administered 2021-06-10 – 2021-06-16 (×7): 40 mg via ORAL
  Filled 2021-06-10 (×7): qty 2

## 2021-06-10 MED ORDER — METFORMIN HCL 500 MG PO TABS
1000.0000 mg | ORAL_TABLET | Freq: Two times a day (BID) | ORAL | Status: DC
  Administered 2021-06-10 – 2021-06-17 (×15): 1000 mg via ORAL
  Filled 2021-06-10 (×15): qty 2

## 2021-06-10 MED ORDER — MOMETASONE FURO-FORMOTEROL FUM 200-5 MCG/ACT IN AERO
2.0000 | INHALATION_SPRAY | Freq: Two times a day (BID) | RESPIRATORY_TRACT | Status: DC
  Administered 2021-06-10 – 2021-06-17 (×15): 2 via RESPIRATORY_TRACT
  Filled 2021-06-10 (×2): qty 8.8

## 2021-06-10 MED ORDER — AMLODIPINE BESYLATE 5 MG PO TABS
5.0000 mg | ORAL_TABLET | Freq: Every day | ORAL | Status: DC
Start: 2021-06-10 — End: 2021-06-17
  Administered 2021-06-10 – 2021-06-17 (×8): 5 mg via ORAL
  Filled 2021-06-10 (×8): qty 1

## 2021-06-10 NOTE — ED Notes (Signed)
Lunch tray given. 

## 2021-06-10 NOTE — BH Assessment (Addendum)
Referral Checks:    [facility] ([phone]- [phone]), John reports that unit has high acuity patients at this time and to check back on Monday if patient still needs placement   [facility] [phone]- [phone]) No behavioral health intake staff available until after 8am   [name] [phone]), No answer, Voicemail was left for return phone call   [facility] [phone] or [phone]) No answer, Voicemail was left for return phone call   [facility] [phone]), No answer   [name] [phone] -or- [phone]), Durenda Age reports that Geri beds are available and to re-send referral. Task completed at 1:02am   [facility] [phone]).   [facility] [phone])

## 2021-06-10 NOTE — ED Notes (Signed)
Report to amy, rn

## 2021-06-10 NOTE — BH Assessment (Signed)
Referral Checks:    [facility] ([phone]- [phone]), John reports that unit has high acuity patients at this time and to check back on Monday if patient still needs placement   [facility] [phone]- [phone]) No admissions staff available   [name] [phone]), No answer, voicemail was left requesting a return phone call   [facility] [phone] or [phone]) No answer, voicemail was left requesting a return phone call   [facility] [phone]), Intake staff agreed to follow up.    [name] [phone] -or- [phone]), Per Durenda Age pt declined due to severe aggression.   [name] [phone]). No answer; mailbox was full.    [facility] [phone]) Per Charisse Klinefelter, facility is at capacity until Monday.

## 2021-06-10 NOTE — ED Provider Notes (Signed)
Emergency Medicine Observation Re-evaluation Note  Larry Cook is a 60 y.o. male, seen on rounds today.    Physical Exam  BP 119/72 (BP Location: Right Arm)   Pulse 81   Temp 97.9 F (36.6 C) (Oral)   Resp 19   Ht 4\' 2"  (1.27 m)   Wt 94 kg   SpO2 97%   BMI 58.28 kg/m  Physical Exam General: Patient resting comfortably in bed Lungs: Patient in no respiratory distress Psych: Patient not combative  ED Course / MDM  EKG:    Plan   patient awaiting psychiatric disposition. Jerrell Mangel is under involuntary commitment.      Winfield Cunas, MD 06/10/21 [phone]

## 2021-06-10 NOTE — ED Notes (Signed)
IVC pending placement 

## 2021-06-10 NOTE — ED Notes (Signed)
Breakfast tray given. °

## 2021-06-10 NOTE — ED Notes (Signed)
Meal tray given 

## 2021-06-11 DIAGNOSIS — F209 Schizophrenia, unspecified: Secondary | ICD-10-CM | POA: Diagnosis not present

## 2021-06-11 LAB — CBG MONITORING, ED
Glucose-Capillary: 238 mg/dL — ABNORMAL HIGH (ref 70–99)
Glucose-Capillary: 265 mg/dL — ABNORMAL HIGH (ref 70–99)
Glucose-Capillary: 251 mg/dL — ABNORMAL HIGH (ref 70–99)
Glucose-Capillary: 190 mg/dL — ABNORMAL HIGH (ref 70–99)

## 2021-06-11 NOTE — ED Notes (Signed)
Pt given lunch tray.

## 2021-06-11 NOTE — ED Notes (Signed)
Pt doesn't answer questions but is calm and cooperative. Pt does not appear to be responding to any external stimuli. Eats breakfast without issue. Takes meds whole without issue.

## 2021-06-11 NOTE — ED Notes (Signed)
Pt up to restroom.

## 2021-06-11 NOTE — ED Notes (Signed)
Pt ambulated to the bathroom at this time, NAD noted at this time.

## 2021-06-11 NOTE — ED Provider Notes (Signed)
Emergency Medicine Observation Re-evaluation Note  Larry Cook is a 60 y.o. male, seen on rounds today.    Physical Exam  BP 132/82 (BP Location: Left Arm)   Pulse 82   Temp 97.9 F (36.6 C) (Oral)   Resp 17   Ht 4\' 2"  (1.27 m)   Wt 94 kg   SpO2 98%   BMI 58.28 kg/m  Physical Exam General: Patient sleeping comfortably in bed Lungs: Patient in no respiratory distress Psych: Patient not combative  ED Course / MDM  EKG:    Plan  Current plan is for psychiatric treatment and disposition Larry Cook is under involuntary commitment.      Winfield Cunas, MD 06/11/21 [phone]

## 2021-06-11 NOTE — ED Notes (Signed)
Patient received dinner tray 

## 2021-06-11 NOTE — ED Notes (Signed)
Breakfast tray given. °

## 2021-06-11 NOTE — ED Notes (Signed)
Pt given snack and drink 

## 2021-06-12 DIAGNOSIS — F209 Schizophrenia, unspecified: Secondary | ICD-10-CM | POA: Diagnosis not present

## 2021-06-12 LAB — CBG MONITORING, ED
Glucose-Capillary: 240 mg/dL — ABNORMAL HIGH (ref 70–99)
Glucose-Capillary: 219 mg/dL — ABNORMAL HIGH (ref 70–99)
Glucose-Capillary: 167 mg/dL — ABNORMAL HIGH (ref 70–99)
Glucose-Capillary: 236 mg/dL — ABNORMAL HIGH (ref 70–99)

## 2021-06-12 NOTE — ED Provider Notes (Signed)
Emergency Medicine Observation Re-evaluation Note  Antoinette Haskett is a 60 y.o. male, seen on rounds today.  Pt initially presented to the ED for complaints of ivc   Physical Exam  BP (!) 147/81   Pulse 92   Temp 97.7 F (36.5 C) (Oral)   Resp 18   Ht 4\' 2"  (1.27 m)   Wt 94 kg   SpO2 98%   BMI 58.28 kg/m  Physical Exam General: no acute distress Lungs: equal chest rise Psych: calm  ED Course / MDM  EKG:   I have reviewed the labs performed to date as well as medications administered while in observation.  Recent changes in the last 24 hours include none.  Plan  Current plan is for inpatient. Ashaad Gaertner is under involuntary commitment.      Winfield Cunas, MD 06/12/21 1230

## 2021-06-12 NOTE — ED Notes (Signed)
Pt received diet drink and small snack for night time snack time  Pt sitting on bed calm. Pt does not speak to tech when tech ask pt questions.

## 2021-06-12 NOTE — ED Notes (Signed)
Report to amy, rn

## 2021-06-12 NOTE — ED Notes (Signed)
Gave food tray with water. °

## 2021-06-12 NOTE — BH Assessment (Addendum)
Referral Checks:    Larry Cook ([phone]- [phone]), Larry Cook reports that unit has high acuity patients at this time and to check back on Monday if patient still needs placement   [facility] [phone] -or- [phone]) No admissions staff available   [name] [phone]), No answer, voicemail was left requesting a return phone call   [name] [phone] or [phone]) Facility reports currently at capacity tonight, check back tomorrow   [facility] [phone]), Aqua reports denied due to aggression and stage 3 kidney disease   [name] [phone] -or- [phone]), Per Durenda Age pt declined due to severe aggression.   [name] [phone]). No answer; mailbox was full.    [name] [phone]) Per Charisse Klinefelter, facility is at capacity until Monday.

## 2021-06-12 NOTE — Progress Notes (Signed)
   06/12/21 1425  Clinical Encounter Type  Visited With Patient  Visit Type Initial;Spiritual support;Social support  Spiritual Encounters  Spiritual Needs Other (Comment) (social support)  Chaplain Burris met with Pt for first time. Pt was receptive but did not offer any verbal engagement. Daryel November introduced herself and offered intentional presence and social support.

## 2021-06-12 NOTE — ED Notes (Signed)
Gave lunch tray with water. 

## 2021-06-12 NOTE — ED Notes (Signed)
Meal tray given 

## 2021-06-12 NOTE — ED Notes (Signed)
IVC pending placement 

## 2021-06-12 NOTE — Progress Notes (Signed)
Inpatient Diabetes Program Recommendations  AACE/ADA: New Consensus Statement on Inpatient Glycemic Control (2015)  Target Ranges:  Prepandial:   less than 140 mg/dL      Peak postprandial:   less than 180 mg/dL (1-2 hours)      Critically ill patients:  140 - 180 mg/dL   Lab Results  Component Value Date   GLUCAP 240 (H) 06/12/2021   HGBA1C 10.9 (H) 05/16/2021    Review of Glycemic Control Results for Larry, Cook (MRN [id number]) as of 06/12/2021 09:59  Ref. Range 06/11/2021 09:47 06/11/2021 12:31 06/11/2021 16:23 06/11/2021 21:08 06/12/2021 08:15  Glucose-Capillary Latest Ref Range: 70 - 99 mg/dL 761 (H) 607 (H) 371 (H) 190 (H) 240 (H)   Diabetes history: DM2 Outpatient Diabetes medications: Lantus 20 units, Metformin 1 gm bid Current orders for Inpatient glycemic control: Semglee 20 units,Novolog correction 0-15 units tid + hs 0-5 units  Inpatient Diabetes Program Recommendations:   Fasting CBG 240. Consider: -Increase Semglee to 24 units -Add Novolog 3 units tid meal coverage if eats 50% if appropriate.  Thank you, Larry Cook. Larry Ealy, RN, MSN, CDE  Diabetes Coordinator Inpatient Glycemic Control Team Team Pager [phone] (8am-5pm) 06/12/2021 10:02 AM

## 2021-06-13 DIAGNOSIS — F209 Schizophrenia, unspecified: Secondary | ICD-10-CM | POA: Diagnosis not present

## 2021-06-13 DIAGNOSIS — F79 Unspecified intellectual disabilities: Secondary | ICD-10-CM | POA: Diagnosis not present

## 2021-06-13 LAB — CBG MONITORING, ED
Glucose-Capillary: 165 mg/dL — ABNORMAL HIGH (ref 70–99)
Glucose-Capillary: 156 mg/dL — ABNORMAL HIGH (ref 70–99)
Glucose-Capillary: 226 mg/dL — ABNORMAL HIGH (ref 70–99)
Glucose-Capillary: 277 mg/dL — ABNORMAL HIGH (ref 70–99)

## 2021-06-13 NOTE — Consult Note (Signed)
[facility] Face-to-Face Psychiatry Consult   Reason for Consult:  brought in under IVC sister for aggression and note taking medication Referring Physician:  EDP Patient Identification: Larry Cook MRN:  [id number] Principal Diagnosis: Schizophrenia [facility]) Diagnosis:  Principal Problem:   Schizophrenia (HCC) Active Problems:   Asthma   Total Time spent with patient: 15 minutes  Subjective:   Larry Cook is a 60 y.o. male patient admitted with medication non-compliance and aggression per his sister (caretaker).  HPI:  See previous note. Today, patient was seen and chart reviewed. Patient has remained non-verbal. He has not had any behavioral outbursts. He is taking all medications as ordered and is allowing finger sticks for blood glucose monitoring.  He has remained calm and cooperative on the unit. He will smile and wave in response to approach.  Writer has made attempts to reach family via numbers in the chart. Per report, patient's sister is his caregiver. The name of Larry Cook, listed as brother is on the chart, is also the person listed on the IVC petition. The correct number for brother Larry Cook, is [phone]. Writer called that number, voice mail identified the number as Velvet Watlington's. HIPAA compliant message was left to please return call to TTS number.   Past Psychiatric History: See previous  Risk to Self:   Risk to Others:   Prior Inpatient Therapy:   Prior Outpatient Therapy:    Past Medical History:  Past Medical History:  Diagnosis Date   Diabetes mellitus without complication (HCC)    Hypertension    Mentally disabled    Schizophrenia (HCC)    No past surgical history on file. Family History: No family history on file. Family Psychiatric  History: See previous Social History:  Social History   Substance and Sexual Activity  Alcohol Use No     Social History   Substance and Sexual Activity  Drug Use No    Social History    Socioeconomic History   Marital status: Single    Spouse name: Not on file   Number of children: Not on file   Years of education: Not on file   Highest education level: Not on file  Occupational History   Not on file  Tobacco Use   Smoking status: Never   Smokeless tobacco: Never  Substance and Sexual Activity   Alcohol use: No   Drug use: No   Sexual activity: Not on file  Other Topics Concern   Not on file  Social History Narrative   Not on file   Social Determinants of Health   Financial Resource Strain: Not on file  Food Insecurity: Not on file  Transportation Needs: Not on file  Physical Activity: Not on file  Stress: Not on file  Social Connections: Not on file   Additional Social History:    Allergies:   Allergies  Allergen Reactions   Acetaminophen-Codeine     Other reaction(s): Unknown    Labs:  Results for orders placed or performed during the hospital encounter of 06/07/21 (from the past 48 hour(s))  CBG monitoring, ED     Status: Abnormal   Collection Time: 06/11/21  9:08 PM  Result Value Ref Range   Glucose-Capillary 190 (H) 70 - 99 mg/dL    Comment: Glucose reference range applies only to samples taken after fasting for at least 8 hours.  CBG monitoring, ED     Status: Abnormal   Collection Time: 06/12/21  8:15 AM  Result Value Ref Range  Glucose-Capillary 240 (H) 70 - 99 mg/dL    Comment: Glucose reference range applies only to samples taken after fasting for at least 8 hours.  CBG monitoring, ED     Status: Abnormal   Collection Time: 06/12/21 11:51 AM  Result Value Ref Range   Glucose-Capillary 219 (H) 70 - 99 mg/dL    Comment: Glucose reference range applies only to samples taken after fasting for at least 8 hours.  CBG monitoring, ED     Status: Abnormal   Collection Time: 06/12/21  4:18 PM  Result Value Ref Range   Glucose-Capillary 167 (H) 70 - 99 mg/dL    Comment: Glucose reference range applies only to samples taken after  fasting for at least 8 hours.  CBG monitoring, ED     Status: Abnormal   Collection Time: 06/12/21 10:03 PM  Result Value Ref Range   Glucose-Capillary 236 (H) 70 - 99 mg/dL    Comment: Glucose reference range applies only to samples taken after fasting for at least 8 hours.  CBG monitoring, ED     Status: Abnormal   Collection Time: 06/13/21  8:45 AM  Result Value Ref Range   Glucose-Capillary 165 (H) 70 - 99 mg/dL    Comment: Glucose reference range applies only to samples taken after fasting for at least 8 hours.  CBG monitoring, ED     Status: Abnormal   Collection Time: 06/13/21 12:02 PM  Result Value Ref Range   Glucose-Capillary 156 (H) 70 - 99 mg/dL    Comment: Glucose reference range applies only to samples taken after fasting for at least 8 hours.  CBG monitoring, ED     Status: Abnormal   Collection Time: 06/13/21  4:15 PM  Result Value Ref Range   Glucose-Capillary 226 (H) 70 - 99 mg/dL    Comment: Glucose reference range applies only to samples taken after fasting for at least 8 hours.    Current Facility-Administered Medications  Medication Dose Route Frequency Provider Last Rate Last Admin   amLODipine (NORVASC) tablet 5 mg  5 mg Oral Daily Delton Prairie, MD   5 mg at 06/13/21 0931   aspirin EC tablet 81 mg  81 mg Oral Daily Delton Prairie, MD   81 mg at 06/13/21 0931   insulin aspart (novoLOG) injection 0-15 Units  0-15 Units Subcutaneous TID WC Don Perking, [place], MD   5 Units at 06/13/21 1637   insulin aspart (novoLOG) injection 0-5 Units  0-5 Units Subcutaneous QHS Don Perking, [place], MD   2 Units at 06/12/21 2214   insulin glargine-yfgn (SEMGLEE) injection 20 Units  20 Units Subcutaneous QHS Delton Prairie, MD   20 Units at 06/12/21 2214   metFORMIN (GLUCOPHAGE) tablet 1,000 mg  1,000 mg Oral BID WC Delton Prairie, MD   1,000 mg at 06/13/21 0932   mometasone-formoterol (DULERA) 200-5 MCG/ACT inhaler 2 puff  2 puff Inhalation BID Delton Prairie, MD   2 puff at 06/13/21 0931    paliperidone (INVEGA) 24 hr tablet 3 mg  3 mg Oral QHS Delton Prairie, MD   3 mg at 06/12/21 2214   pantoprazole (PROTONIX) EC tablet 40 mg  40 mg Oral Daily Delton Prairie, MD   40 mg at 06/13/21 0931   pramipexole (MIRAPEX) tablet 0.5 mg  0.5 mg Oral TID Delton Prairie, MD   0.5 mg at 06/12/21 2214   pravastatin (PRAVACHOL) tablet 40 mg  40 mg Oral q1800 Delton Prairie, MD   40 mg at 06/12/21 1641  Current Outpatient Medications  Medication Sig Dispense Refill   amLODipine (NORVASC) 5 MG tablet Take 5 mg by mouth daily.  2   budesonide-formoterol (SYMBICORT) 160-4.5 MCG/ACT inhaler Inhale 2 puffs into the lungs 2 (two) times daily.     HYDROcodone-acetaminophen (NORCO) 10-325 MG tablet Take 1 tablet by mouth every 6 (six) hours as needed for pain.     lovastatin (MEVACOR) 40 MG tablet Take 40 mg by mouth daily with supper.  3   metFORMIN (GLUCOPHAGE) 1000 MG tablet Take 1,000 mg by mouth 2 (two) times daily.  3   omeprazole (PRILOSEC) 20 MG capsule Take 20 mg by mouth 2 (two) times daily.  3   paliperidone (INVEGA) 3 MG 24 hr tablet Take 1 tablet (3 mg total) by mouth at bedtime. 30 tablet 1   pramipexole (MIRAPEX) 0.5 MG tablet Take 0.5 mg by mouth 3 (three) times daily.     aspirin EC 81 MG tablet Take 81 mg by mouth daily.     insulin glargine (LANTUS) 100 UNIT/ML injection Inject 20 Units into the skin at bedtime.     [START ON 06/19/2021] paliperidone (INVEGA SUSTENNA) 156 MG/ML SUSY injection Inject 0.5 mLs (78 mg total) into the muscle every 28 (twenty-eight) days. 1.2 mL 3    Musculoskeletal: Strength & Muscle Tone: within normal limits Gait & Station: normal Patient leans: N/A            Psychiatric Specialty Exam:  Presentation  General Appearance: Appropriate for Environment  Eye Contact:Fair  Speech:-- (Did not speak)  Speech Volume:Normal  Handedness:Right   Mood and Affect  Mood:-- (Quiet, laying on bed)  Affect:Blunt   Thought Process  Thought  Processes:Disorganized  Descriptions of Associations:Intact  Orientation:Other (comment) (unknown)  Thought Content:Logical  History of Schizophrenia/Schizoaffective disorder:Yes  Duration of Psychotic Symptoms:Greater than six months  Hallucinations:No data recorded Ideas of Reference:None  Suicidal Thoughts:No data recorded Homicidal Thoughts:No data recorded  Sensorium  Memory:Immediate Poor; Recent Poor; Remote Poor  Judgment:Impaired  Insight:Lacking   Executive Functions  Concentration:Poor  Attention Span:Poor  Recall:Poor  Fund of Knowledge:Poor  Language:-- (Did not speak)   Psychomotor Activity  Psychomotor Activity:No data recorded  Assets  Assets:Resilience; Social Support; Housing   Sleep  Sleep:No data recorded  Physical Exam: Physical Exam ROS Blood pressure (!) 146/77, pulse 81, temperature 97.9 F (36.6 C), temperature source Oral, resp. rate 17, height 4\' 2"  (1.27 m), weight 94 kg, SpO2 99 %. Body mass index is 58.28 kg/m.  Treatment Plan Summary: Daily contact with patient to assess and evaluate symptoms and progress in treatment, Medication management, and Plan : Patient is stable at this time. No aggression shown, is med compliant. Attempts to contact family have been unsuccessful. Disposition unclear at this time.   Disposition: No evidence of imminent risk to self or others at present.  Will contact SW.   , NP 06/13/2021 5:01 PM

## 2021-06-13 NOTE — ED Notes (Signed)
Pt given snack. 

## 2021-06-13 NOTE — ED Notes (Signed)
Food tray with drink was given. °

## 2021-06-13 NOTE — ED Notes (Signed)
Meal tray given 

## 2021-06-13 NOTE — ED Notes (Signed)
IVC/Pending Placement 

## 2021-06-13 NOTE — ED Provider Notes (Signed)
Emergency Medicine Observation Re-evaluation Note  Larry Cook is a 60 y.o. male, seen on rounds today.  Pt initially presented to the ED for complaints of ivc Currently, the patient is calm, resting.  Physical Exam  BP (!) 151/97 (BP Location: Left Arm)   Pulse 78   Temp 97.9 F (36.6 C) (Oral)   Resp 16   Ht 4\' 2"  (1.27 m)   Wt 94 kg   SpO2 99%   BMI 58.28 kg/m   ED Course / MDM  EKG:   I have reviewed the labs performed to date as well as medications administered while in observation.  Recent changes in the last 24 hours include none.  Plan  Current plan is for psychiatric disposition. Larry Cook is under involuntary commitment.      Larry Cunas, MD 06/13/21 1754

## 2021-06-13 NOTE — ED Notes (Signed)
Unable to obtain vitals due to patient sleeping. Will continue to monitor.   

## 2021-06-13 NOTE — ED Notes (Signed)
Pt. Alert and oriented, warm and dry, in no distress. Patient does not appear to be responding to internal stimuli. Pt. Encouraged to let nursing staff know of any concerns or needs.

## 2021-06-14 DIAGNOSIS — F209 Schizophrenia, unspecified: Secondary | ICD-10-CM | POA: Diagnosis not present

## 2021-06-14 LAB — CBG MONITORING, ED
Glucose-Capillary: 150 mg/dL — ABNORMAL HIGH (ref 70–99)
Glucose-Capillary: 151 mg/dL — ABNORMAL HIGH (ref 70–99)
Glucose-Capillary: 178 mg/dL — ABNORMAL HIGH (ref 70–99)
Glucose-Capillary: 209 mg/dL — ABNORMAL HIGH (ref 70–99)

## 2021-06-14 NOTE — ED Notes (Addendum)

## 2021-06-14 NOTE — ED Notes (Signed)
Pt. Alert and oriented, warm and dry, in no distress. Unable to preform psych assessment due to patient nonverbal. Pt. Encouraged to let nursing staff know of any concerns or needs.

## 2021-06-14 NOTE — ED Notes (Signed)
Unable to obtain vitals due to patient sleeping. Will continue to monitor.   

## 2021-06-15 DIAGNOSIS — F209 Schizophrenia, unspecified: Secondary | ICD-10-CM | POA: Diagnosis not present

## 2021-06-15 LAB — CBG MONITORING, ED
Glucose-Capillary: 149 mg/dL — ABNORMAL HIGH (ref 70–99)
Glucose-Capillary: 139 mg/dL — ABNORMAL HIGH (ref 70–99)
Glucose-Capillary: 199 mg/dL — ABNORMAL HIGH (ref 70–99)
Glucose-Capillary: 236 mg/dL — ABNORMAL HIGH (ref 70–99)

## 2021-06-15 NOTE — ED Notes (Signed)
Snack and beverage given. 

## 2021-06-15 NOTE — ED Notes (Signed)
Hourly rounding reveals patient in room. No complaints, stable, in no acute distress. Q15 minute rounds and monitoring via Security Cameras to continue. 

## 2021-06-15 NOTE — Consult Note (Signed)
  Writer saw patient today. Attempted communication. Patient has remained calm and cooperative in the unit. He smiles on approach and will wave. He uses hand gestures to ask for needs. He will stand at bathroom door to indicate needs for toilet. Patient has been taking medications as directed. Still unable to reach patient's brother Building control surveyor). I spoke with Maryann Alar, RN, Case Manager, who stated she is filling in for [employer], SW. Patient is stable and ready to return home and family needs to pick him up. Ms. Melven Sartorius states she will try to reach brother and will also talk to her supervisor if she is unable to reach Mr. Watlington.

## 2021-06-15 NOTE — Consult Note (Signed)
  Writer received call back from patient's sister, Larry Cook. (Incorrectly listed as brother in the chart). Ms. Larry Cook states that social worker from [facility] told her that patient "can't return to the house because there are children in the house." Writer informed Ms. Watlington that patient is stable for discharge and the ED is not an appropriate place for him to stay. Ms. Larry Cook informed this information would be passed to social work department here. Writer sent secure chat to Maryann Alar, RN, [facility], informing her of this conversation.

## 2021-06-15 NOTE — TOC Progression Note (Signed)
Transition of Care [facility]) - Progression Note    Patient Details  Name: Larry Cook MRN: [id number] Date of Birth: 29-Jun-1961  Transition of Care [facility]) CM/SW Contact  Hetty Ely, RN Phone Number: 06/15/2021, 12:47 PM  Clinical Narrative:  Attempted to call [employer], [phone] no answer left message to return call.          Expected Discharge Plan and Services                                                 Social Determinants of Health (SDOH) Interventions    Readmission Risk Interventions No flowsheet data found.

## 2021-06-15 NOTE — ED Notes (Signed)
Hospital meal provided.  100% consumed, pt tolerated w/o complaints.  Waste discarded appropriately.   

## 2021-06-15 NOTE — ED Notes (Signed)
IVC/  PENDING  PLACEMENT 

## 2021-06-15 NOTE — ED Notes (Signed)
IVC/Pending Placement 

## 2021-06-15 NOTE — ED Notes (Addendum)
Pt. Alert and oriented, warm and dry, in no distress. Unable to preform psych assessment due to patient not speaking. Pt. Encouraged to let nursing staff know of any concerns or needs.  ENVIRONMENTAL ASSESSMENT Potentially harmful objects out of patient reach: Yes.   Personal belongings secured: Yes.   Patient dressed in hospital provided attire only: Yes.   Plastic bags out of patient reach: Yes.   Patient care equipment (cords, cables, call bells, lines, and drains) shortened, removed, or accounted for: Yes.   Equipment and supplies removed from bottom of stretcher: Yes.   Potentially toxic materials out of patient reach: Yes.   Sharps container removed or out of patient reach: Yes.

## 2021-06-15 NOTE — ED Notes (Signed)
Report to include Situation, Background, Assessment, and Recommendations received from [facility]. Patient alert and oriented, warm and dry, in no acute distress. UTA SI, HI, AVH and pain. Patient made aware of Q15 minute rounds and security cameras for their safety. Patient instructed to come to me with needs or concerns.

## 2021-06-15 NOTE — ED Notes (Signed)
Unlocked bathroom door to allow patient to use restroom. Pt exited and bathroom door locked behind patient by staff.  

## 2021-06-16 DIAGNOSIS — F840 Autistic disorder: Secondary | ICD-10-CM

## 2021-06-16 DIAGNOSIS — F209 Schizophrenia, unspecified: Secondary | ICD-10-CM | POA: Diagnosis not present

## 2021-06-16 DIAGNOSIS — F79 Unspecified intellectual disabilities: Secondary | ICD-10-CM | POA: Diagnosis not present

## 2021-06-16 LAB — CBG MONITORING, ED
Glucose-Capillary: 153 mg/dL — ABNORMAL HIGH (ref 70–99)
Glucose-Capillary: 245 mg/dL — ABNORMAL HIGH (ref 70–99)
Glucose-Capillary: 163 mg/dL — ABNORMAL HIGH (ref 70–99)
Glucose-Capillary: 293 mg/dL — ABNORMAL HIGH (ref 70–99)

## 2021-06-16 NOTE — ED Notes (Signed)
Hourly rounding reveals patient in room. No complaints, stable, in no acute distress. Q15 minute rounds and monitoring via Security Cameras to continue. 

## 2021-06-16 NOTE — ED Notes (Signed)
Report to include Situation, Background, Assessment, and Recommendations received from Katie RN. Patient alert and oriented, warm and dry, in no acute distress. Patient denies SI, HI, AVH and pain. Patient made aware of Q15 minute rounds and security cameras for their safety. Patient instructed to come to me with needs or concerns.  

## 2021-06-16 NOTE — ED Notes (Signed)
VS not taken, patient asleep 

## 2021-06-16 NOTE — ED Notes (Signed)
IVC/Pending Placement 

## 2021-06-16 NOTE — ED Provider Notes (Signed)
Emergency Medicine Observation Re-evaluation Note  Larry Cook is a 60 y.o. male, seen on rounds today.  Pt initially presented to the ED for complaints of ivc Currently, the patient is calm, resting.  Physical Exam  BP (!) 145/81   Pulse 90   Temp 98.6 F (37 C) (Oral)   Resp 20   Ht 4\' 2"  (1.27 m)   Wt 94 kg   SpO2 100%   BMI 58.28 kg/m    ED Course / MDM  EKG:   I have reviewed the labs performed to date as well as medications administered while in observation.  Recent changes in the last 24 hours include none. Blood sugars have ben stable. VS reviewed.  Plan  Current plan is for psychiatric admission. Larry Cook is under involuntary commitment.      Winfield Cunas, MD 06/16/21 1029

## 2021-06-16 NOTE — Consult Note (Signed)
[facility] Face-to-Face Psychiatry Consult   Reason for Consult: Follow-up consult for this 60 year old man with intellectual disability possible schizophrenia and autistic spectrum disorder. Referring Physician: Erma Heritage Patient Identification: Larry Cook MRN:  [id number] Principal Diagnosis: Intellectual disability Diagnosis:  Principal Problem:   Intellectual disability Active Problems:   Schizophrenia (HCC)   Asthma   CKD (chronic kidney disease), stage III (HCC)   Diabetes mellitus type 2, uncomplicated (HCC)   Autistic behavior   Total Time spent with patient: 30 minutes  Subjective:   Larry Cook is a 60 y.o. male patient admitted with patient is nonverbal and does not really communicate.  HPI: History of longstanding chronic disability.  Multiple medical problems and disabilities including blindness deafness intellectual disability.  Patient has been living with his siblings but has recently had some agitated behavior.  Here in the emergency room he has not been displaying any problem behavior.  He has been calm and cooperative and medicine compliant.  No complaints expressed  Past Psychiatric History: Supposes schizophrenia with little documentation available  Risk to Self:   Risk to Others:   Prior Inpatient Therapy:   Prior Outpatient Therapy:    Past Medical History:  Past Medical History:  Diagnosis Date   Diabetes mellitus without complication (HCC)    Hypertension    Mentally disabled    Schizophrenia (HCC)    No past surgical history on file. Family History: No family history on file. Family Psychiatric  History: None known Social History:  Social History   Substance and Sexual Activity  Alcohol Use No     Social History   Substance and Sexual Activity  Drug Use No    Social History   Socioeconomic History   Marital status: Single    Spouse name: Not on file   Number of children: Not on file   Years of education: Not on file   Highest education  level: Not on file  Occupational History   Not on file  Tobacco Use   Smoking status: Never   Smokeless tobacco: Never  Substance and Sexual Activity   Alcohol use: No   Drug use: No   Sexual activity: Not on file  Other Topics Concern   Not on file  Social History Narrative   Not on file   Social Determinants of Health   Financial Resource Strain: Not on file  Food Insecurity: Not on file  Transportation Needs: Not on file  Physical Activity: Not on file  Stress: Not on file  Social Connections: Not on file   Additional Social History:    Allergies:   Allergies  Allergen Reactions   Acetaminophen-Codeine     Other reaction(s): Unknown    Labs:  Results for orders placed or performed during the hospital encounter of 06/07/21 (from the past 48 hour(s))  CBG monitoring, ED     Status: Abnormal   Collection Time: 06/14/21  4:16 PM  Result Value Ref Range   Glucose-Capillary 178 (H) 70 - 99 mg/dL    Comment: Glucose reference range applies only to samples taken after fasting for at least 8 hours.  CBG monitoring, ED     Status: Abnormal   Collection Time: 06/14/21  8:51 PM  Result Value Ref Range   Glucose-Capillary 209 (H) 70 - 99 mg/dL    Comment: Glucose reference range applies only to samples taken after fasting for at least 8 hours.  CBG monitoring, ED     Status: Abnormal   Collection Time: 06/15/21  8:12 AM  Result Value Ref Range   Glucose-Capillary 149 (H) 70 - 99 mg/dL    Comment: Glucose reference range applies only to samples taken after fasting for at least 8 hours.   Comment 1 Notify RN   CBG monitoring, ED     Status: Abnormal   Collection Time: 06/15/21  1:36 PM  Result Value Ref Range   Glucose-Capillary 139 (H) 70 - 99 mg/dL    Comment: Glucose reference range applies only to samples taken after fasting for at least 8 hours.  CBG monitoring, ED     Status: Abnormal   Collection Time: 06/15/21  4:10 PM  Result Value Ref Range    Glucose-Capillary 199 (H) 70 - 99 mg/dL    Comment: Glucose reference range applies only to samples taken after fasting for at least 8 hours.  CBG monitoring, ED     Status: Abnormal   Collection Time: 06/15/21  9:52 PM  Result Value Ref Range   Glucose-Capillary 236 (H) 70 - 99 mg/dL    Comment: Glucose reference range applies only to samples taken after fasting for at least 8 hours.  CBG monitoring, ED     Status: Abnormal   Collection Time: 06/16/21  8:19 AM  Result Value Ref Range   Glucose-Capillary 153 (H) 70 - 99 mg/dL    Comment: Glucose reference range applies only to samples taken after fasting for at least 8 hours.  CBG monitoring, ED     Status: Abnormal   Collection Time: 06/16/21 12:07 PM  Result Value Ref Range   Glucose-Capillary 245 (H) 70 - 99 mg/dL    Comment: Glucose reference range applies only to samples taken after fasting for at least 8 hours.    Current Facility-Administered Medications  Medication Dose Route Frequency Provider Last Rate Last Admin   amLODipine (NORVASC) tablet 5 mg  5 mg Oral Daily Delton Prairie, MD   5 mg at 06/16/21 1036   aspirin EC tablet 81 mg  81 mg Oral Daily Delton Prairie, MD   81 mg at 06/16/21 1037   insulin aspart (novoLOG) injection 0-15 Units  0-15 Units Subcutaneous TID WC Don Perking, [place], MD   5 Units at 06/16/21 1211   insulin aspart (novoLOG) injection 0-5 Units  0-5 Units Subcutaneous QHS Don Perking, [place], MD   2 Units at 06/15/21 2156   insulin glargine-yfgn (SEMGLEE) injection 20 Units  20 Units Subcutaneous QHS Delton Prairie, MD   20 Units at 06/15/21 2153   metFORMIN (GLUCOPHAGE) tablet 1,000 mg  1,000 mg Oral BID WC Delton Prairie, MD   1,000 mg at 06/16/21 1605   mometasone-formoterol (DULERA) 200-5 MCG/ACT inhaler 2 puff  2 puff Inhalation BID Delton Prairie, MD   2 puff at 06/16/21 1216   paliperidone (INVEGA) 24 hr tablet 3 mg  3 mg Oral QHS Delton Prairie, MD   3 mg at 06/15/21 2150   pantoprazole (PROTONIX) EC tablet 40  mg  40 mg Oral Daily Delton Prairie, MD   40 mg at 06/16/21 1036   pramipexole (MIRAPEX) tablet 0.5 mg  0.5 mg Oral TID Delton Prairie, MD   0.5 mg at 06/16/21 1600   pravastatin (PRAVACHOL) tablet 40 mg  40 mg Oral q1800 Delton Prairie, MD   40 mg at 06/16/21 1605   Current Outpatient Medications  Medication Sig Dispense Refill   amLODipine (NORVASC) 5 MG tablet Take 5 mg by mouth daily.  2   budesonide-formoterol (SYMBICORT) 160-4.5 MCG/ACT inhaler Inhale 2  puffs into the lungs 2 (two) times daily.     HYDROcodone-acetaminophen (NORCO) 10-325 MG tablet Take 1 tablet by mouth every 6 (six) hours as needed for pain.     lovastatin (MEVACOR) 40 MG tablet Take 40 mg by mouth daily with supper.  3   metFORMIN (GLUCOPHAGE) 1000 MG tablet Take 1,000 mg by mouth 2 (two) times daily.  3   omeprazole (PRILOSEC) 20 MG capsule Take 20 mg by mouth 2 (two) times daily.  3   paliperidone (INVEGA) 3 MG 24 hr tablet Take 1 tablet (3 mg total) by mouth at bedtime. 30 tablet 1   pramipexole (MIRAPEX) 0.5 MG tablet Take 0.5 mg by mouth 3 (three) times daily.     aspirin EC 81 MG tablet Take 81 mg by mouth daily.     insulin glargine (LANTUS) 100 UNIT/ML injection Inject 20 Units into the skin at bedtime.     [START ON 06/19/2021] paliperidone (INVEGA SUSTENNA) 156 MG/ML SUSY injection Inject 0.5 mLs (78 mg total) into the muscle every 28 (twenty-eight) days. 1.2 mL 3    Musculoskeletal: Strength & Muscle Tone: decreased Gait & Station: normal Patient leans: N/A            Psychiatric Specialty Exam:  Presentation  General Appearance: Casual  Eye Contact:Fair  Speech:Other (comment) (non-verbal)  Speech Volume:Normal  Handedness:Right   Mood and Affect  Mood:Euthymic  Affect:Blunt   Thought Process  Thought Processes:Disorganized  Descriptions of Associations:Intact  Orientation:-- (UTA)  Thought Content:Logical  History of Schizophrenia/Schizoaffective disorder:Yes  Duration  of Psychotic Symptoms:Greater than six months  Hallucinations:No data recorded Ideas of Reference:None  Suicidal Thoughts:No data recorded Homicidal Thoughts:No data recorded  Sensorium  Memory:Immediate Poor; Recent Poor; Remote Poor  Judgment:Impaired  Insight:Lacking   Executive Functions  Concentration:Poor  Attention Span:Poor  Recall:Poor  Fund of Knowledge:Poor  Language:-- (Did not speak)   Psychomotor Activity  Psychomotor Activity:No data recorded  Assets  Assets:Resilience; Social Support; Housing   Sleep  Sleep:No data recorded  Physical Exam: Physical Exam Vitals and nursing note reviewed.  Constitutional:      Appearance: Normal appearance.  HENT:     Head: Atraumatic.     Comments: Patient is deaf and blind    Mouth/Throat:     Pharynx: Oropharynx is clear.  Eyes:     Pupils: Pupils are equal, round, and reactive to light.  Cardiovascular:     Rate and Rhythm: Normal rate and regular rhythm.  Pulmonary:     Effort: Pulmonary effort is normal.     Breath sounds: Normal breath sounds.  Abdominal:     General: Abdomen is flat.     Palpations: Abdomen is soft.  Musculoskeletal:        General: Normal range of motion.  Skin:    General: Skin is warm and dry.  Neurological:     General: No focal deficit present.     Mental Status: He is alert. Mental status is at baseline.  Psychiatric:        Attention and Perception: He is inattentive.        Mood and Affect: Affect is blunt.        Speech: He is noncommunicative.   Review of Systems  Unable to perform ROS: Patient nonverbal  Neurological: Negative.   Blood pressure (!) 144/88, pulse 93, temperature 98.3 F (36.8 C), temperature source Oral, resp. rate (!) 21, height 4\' 2"  (1.27 m), weight 94 kg, SpO2 96 %. Body mass  index is 58.28 kg/m.  Treatment Plan Summary: Plan patient has stabilized in the emergency room.  He is not acutely dangerous and does not require inpatient  psychiatric treatment and is psychiatrically cleared.  No longer meets commitment criteria which has been discontinued.  Recommend discharge back to his home living situation.   Disposition: Patient does not meet criteria for psychiatric inpatient admission.  Mordecai Rasmussen, MD 06/16/2021 4:07 PM

## 2021-06-16 NOTE — ED Notes (Signed)
Rescinded IVC by dr. Toni Amend pt to go home 10/29 with sister

## 2021-06-16 NOTE — ED Notes (Signed)
Pt to bathroom

## 2021-06-16 NOTE — TOC Progression Note (Signed)
Transition of Care [facility]) - Progression Note    Patient Details  Name: Larry Cook MRN: [id number] Date of Birth: 01-23-1961  Transition of Care [facility] CM/SW Contact  Joseph Art, [place] Phone Number: 06/16/2021, 1:37 PM  Clinical Narrative:     CSW was updated by Psychiatrist that patient no longer meets psychiatric criteria for inpatient care.  CSW contacted patient's sister [name] [phone], who stated she will pick up the patient tomorrow 06/17/2021, at 12:00PM.  CSW updated Psychiatrist and ED RN. CSW left voicemail w/ updated discharge information for Kennon Portela [phone] at [facility].    Barriers to Discharge: Family Issues  Expected Discharge Plan and Services                                                 Social Determinants of Health (SDOH) Interventions    Readmission Risk Interventions No flowsheet data found. -

## 2021-06-16 NOTE — ED Notes (Signed)
Cbg  154 

## 2021-06-16 NOTE — TOC Progression Note (Deleted)
Patient

## 2021-06-17 DIAGNOSIS — F209 Schizophrenia, unspecified: Secondary | ICD-10-CM | POA: Diagnosis not present

## 2021-06-17 LAB — CBG MONITORING, ED: Glucose-Capillary: 176 mg/dL — ABNORMAL HIGH (ref 70–99)

## 2021-06-17 NOTE — ED Notes (Signed)
Pt dressing for discharge.  

## 2021-06-17 NOTE — ED Notes (Signed)
Hourly rounding reveals patient in room. No complaints, stable, in no acute distress. Q15 minute rounds and monitoring via Security Cameras to continue. 

## 2021-06-17 NOTE — ED Notes (Signed)
VS not taken, patient asleep 

## 2021-06-17 NOTE — TOC Transition Note (Signed)
Transition of Care [facility]) - CM/SW Discharge Note   Patient Details  Name: Larry Cook MRN: [id number] Date of Birth: 26-Feb-1961  [facility]) CM/SW Contact:  Hetty Ely, RN Phone Number: 06/17/2021, 11:35 AM   Clinical Narrative: Called sister Larry Cook to inquire about pick up time for discharge. Sister states she is on her way now. RN notified and says she has belongings ready. TOC barriers resolved.      Barriers to Discharge: Family Issues   Patient Goals and CMS Choice        Discharge Placement                       Discharge Plan and Services                                     Social Determinants of Health (SDOH) Interventions     Readmission Risk Interventions No flowsheet data found.

## 2021-06-17 NOTE — ED Provider Notes (Signed)
Emergency Medicine Observation Re-evaluation Note  Larry Cook is a 60 y.o. male, seen on rounds today.  Pt initially presented to the ED for complaints of ivc Currently, the patient is resting comfortably.  Physical Exam  BP 139/75 (BP Location: Left Arm)   Pulse (!) 102   Temp 98.1 F (36.7 C) (Oral)   Resp 18   Ht 4\' 2"  (1.27 m)   Wt 94 kg   SpO2 97%   BMI 58.28 kg/m  Physical Exam General: No acute distress Cardiac: Well-perfused extremities Lungs: No respiratory distress Psych: Appropriate mood and affect  ED Course / MDM  EKG:   I have reviewed the labs performed to date as well as medications administered while in observation.  Recent changes in the last 24 hours include none.  Plan  Current plan is for placement.  Larry Cook is not under involuntary commitment.     Winfield Cunas, MD 06/17/21 [phone]

## 2021-06-17 NOTE — ED Notes (Signed)
Pt discharged home with sister/caregiver.  VS stable. Belongings including wallet, wrench and watch returned to [employer].

## 2021-08-23 ENCOUNTER — Emergency Department: Payer: Medicare Other

## 2021-08-23 ENCOUNTER — Emergency Department
Admission: EM | Admit: 2021-08-23 | Discharge: 2021-08-24 | Disposition: A | Discharge status: Home / Self Care | Payer: Medicare Other | Attending: Emergency Medicine | Admitting: Emergency Medicine

## 2021-08-23 ENCOUNTER — Other Ambulatory Visit: Payer: Self-pay

## 2021-08-23 DIAGNOSIS — J45909 Unspecified asthma, uncomplicated: Secondary | ICD-10-CM | POA: Diagnosis present

## 2021-08-23 DIAGNOSIS — S0181XA Laceration without foreign body of other part of head, initial encounter: Secondary | ICD-10-CM | POA: Insufficient documentation

## 2021-08-23 DIAGNOSIS — W1839XA Other fall on same level, initial encounter: Secondary | ICD-10-CM | POA: Insufficient documentation

## 2021-08-23 DIAGNOSIS — R4689 Other symptoms and signs involving appearance and behavior: Secondary | ICD-10-CM | POA: Diagnosis present

## 2021-08-23 DIAGNOSIS — F203 Undifferentiated schizophrenia: Secondary | ICD-10-CM | POA: Diagnosis present

## 2021-08-23 DIAGNOSIS — S8991XA Unspecified injury of right lower leg, initial encounter: Secondary | ICD-10-CM | POA: Diagnosis present

## 2021-08-23 DIAGNOSIS — F79 Unspecified intellectual disabilities: Secondary | ICD-10-CM

## 2021-08-23 DIAGNOSIS — Z6841 Body Mass Index (BMI) 40.0 and over, adult: Secondary | ICD-10-CM

## 2021-08-23 DIAGNOSIS — F209 Schizophrenia, unspecified: Secondary | ICD-10-CM | POA: Diagnosis not present

## 2021-08-23 DIAGNOSIS — Y900 Blood alcohol level of less than 20 mg/100 ml: Secondary | ICD-10-CM | POA: Insufficient documentation

## 2021-08-23 DIAGNOSIS — E119 Type 2 diabetes mellitus without complications: Secondary | ICD-10-CM

## 2021-08-23 DIAGNOSIS — I129 Hypertensive chronic kidney disease with stage 1 through stage 4 chronic kidney disease, or unspecified chronic kidney disease: Secondary | ICD-10-CM | POA: Insufficient documentation

## 2021-08-23 DIAGNOSIS — S82841A Displaced bimalleolar fracture of right lower leg, initial encounter for closed fracture: Secondary | ICD-10-CM | POA: Diagnosis not present

## 2021-08-23 DIAGNOSIS — R456 Violent behavior: Secondary | ICD-10-CM | POA: Insufficient documentation

## 2021-08-23 DIAGNOSIS — N189 Chronic kidney disease, unspecified: Secondary | ICD-10-CM | POA: Diagnosis not present

## 2021-08-23 DIAGNOSIS — R4182 Altered mental status, unspecified: Secondary | ICD-10-CM | POA: Diagnosis present

## 2021-08-23 DIAGNOSIS — I10 Essential (primary) hypertension: Secondary | ICD-10-CM

## 2021-08-23 DIAGNOSIS — E1122 Type 2 diabetes mellitus with diabetic chronic kidney disease: Secondary | ICD-10-CM | POA: Diagnosis not present

## 2021-08-23 DIAGNOSIS — S82891A Other fracture of right lower leg, initial encounter for closed fracture: Secondary | ICD-10-CM

## 2021-08-23 DIAGNOSIS — F840 Autistic disorder: Secondary | ICD-10-CM | POA: Diagnosis not present

## 2021-08-23 LAB — CBC
HCT: 40.7 % (ref 39.0–52.0)
Hemoglobin: 13.3 g/dL (ref 13.0–17.0)
MCH: 29.3 pg (ref 26.0–34.0)
MCHC: 32.7 g/dL (ref 30.0–36.0)
MCV: 89.6 fL (ref 80.0–100.0)
Platelets: 243 10*3/uL (ref 150–400)
RBC: 4.54 MIL/uL (ref 4.22–5.81)
RDW: 13.6 % (ref 11.5–15.5)
WBC: 5.6 10*3/uL (ref 4.0–10.5)
nRBC: 0 % (ref 0.0–0.2)

## 2021-08-23 LAB — COMPREHENSIVE METABOLIC PANEL
ALT: 17 U/L (ref 0–44)
AST: 22 U/L (ref 15–41)
Albumin: 3.6 g/dL (ref 3.5–5.0)
Alkaline Phosphatase: 148 U/L — ABNORMAL HIGH (ref 38–126)
Anion gap: 9 (ref 5–15)
BUN: 20 mg/dL (ref 6–20)
CO2: 27 mmol/L (ref 22–32)
Calcium: 9.2 mg/dL (ref 8.9–10.3)
Chloride: 97 mmol/L — ABNORMAL LOW (ref 98–111)
Creatinine, Ser: 1.53 mg/dL — ABNORMAL HIGH (ref 0.61–1.24)
GFR, Estimated: 52 mL/min — ABNORMAL LOW (ref >60)
Glucose, Bld: 416 mg/dL — ABNORMAL HIGH (ref 70–99)
Potassium: 4.4 mmol/L (ref 3.5–5.1)
Sodium: 133 mmol/L — ABNORMAL LOW (ref 135–145)
Total Bilirubin: 0.6 mg/dL (ref 0.3–1.2)
Total Protein: 8.1 g/dL (ref 6.5–8.1)

## 2021-08-23 LAB — SALICYLATE LEVEL: Salicylate Lvl: <7 mg/dL — ABNORMAL LOW (ref 7.0–30.0)

## 2021-08-23 LAB — ETHANOL: Alcohol, Ethyl (B): <10 mg/dL (ref <10)

## 2021-08-23 LAB — ACETAMINOPHEN LEVEL: Acetaminophen (Tylenol), Serum: <10 ug/mL — ABNORMAL LOW (ref 10–30)
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
## 2021-08-23 NOTE — ED Notes (Signed)
Pt dressed out with this RN and Gabby NT. Belongings include: Camo hat Black shoes Gray long sleeve shirt Gray sweat pants Teacher, adult education boxers
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
## 2021-08-23 NOTE — ED Notes (Signed)
Pt stood from recliner to attempt to go to restroom to provide urine sample. Pt unstead and sits back down.

## 2021-08-23 NOTE — ED Notes (Signed)
Patient transferred from Triage to [facility] after dressing out and screening for contraband. Report received from [place], [place] including situation, background, assessment and recommendations. Pt oriented to [employer] including Q15 minute rounds as well as Psychologist, counselling for their protection. Patient is alert and oriented, warm and dry in no acute distress.  Pt. Encouraged to let this nurse know if needs arise.
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
## 2021-08-24 DIAGNOSIS — F840 Autistic disorder: Secondary | ICD-10-CM

## 2021-08-24 DIAGNOSIS — S82841A Displaced bimalleolar fracture of right lower leg, initial encounter for closed fracture: Secondary | ICD-10-CM | POA: Diagnosis not present

## 2021-08-24 LAB — URINE DRUG SCREEN, QUALITATIVE (ARMC ONLY)
Amphetamines, Ur Screen: NOT DETECTED
Barbiturates, Ur Screen: NOT DETECTED
Benzodiazepine, Ur Scrn: NOT DETECTED
Cannabinoid 50 Ng, Ur ~~LOC~~: NOT DETECTED
Cocaine Metabolite,Ur ~~LOC~~: NOT DETECTED
MDMA (Ecstasy)Ur Screen: NOT DETECTED
Methadone Scn, Ur: NOT DETECTED
Opiate, Ur Screen: NOT DETECTED
Phencyclidine (PCP) Ur S: NOT DETECTED
Tricyclic, Ur Screen: NOT DETECTED

## 2021-08-24 MED ORDER — OXYCODONE HCL 5 MG PO TABS: 2.5000 mg | ORAL_TABLET | ORAL | Status: DC

## 2021-08-24 MED ORDER — HYDROCODONE-ACETAMINOPHEN 5-325 MG PO TABS: 1.0000 | ORAL_TABLET | Freq: Four times a day (QID) | ORAL | 0 refills | Status: AC | PRN

## 2021-08-24 MED ORDER — IBUPROFEN 400 MG PO TABS
400.0000 mg | ORAL_TABLET | Freq: Once | ORAL | Status: AC
  Administered 2021-08-24: 400 mg via ORAL
  Filled 2021-08-24: qty 1
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
## 2021-08-24 NOTE — ED Notes (Signed)
Pt awake, to restroom via wheelchair assistance by tech. Pt able to stand with one person assist and can pivot to wheelchair

## 2021-08-24 NOTE — ED Notes (Signed)
Spoke with patients sister, she reports she will be here at 10am to pick patient up

## 2021-08-24 NOTE — ED Notes (Signed)
VOL/Consult completed/Pending reassessment in the AM for D/C

## 2021-08-24 NOTE — ED Provider Notes (Signed)
Patient seen by psychiatry on a.m. rounds and cleared for discharge.  Discharged in stable condition.   Gilles Chiquito, MD 08/24/21 [phone]
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
## 2021-10-29 ENCOUNTER — Emergency Department
Admission: EM | Admit: 2021-10-29 | Discharge: 2021-10-31 | Disposition: A | Discharge status: Home / Self Care | Payer: Medicare Other | Attending: Emergency Medicine | Admitting: Emergency Medicine

## 2021-10-29 ENCOUNTER — Other Ambulatory Visit: Payer: Self-pay

## 2021-10-29 ENCOUNTER — Encounter: Payer: Self-pay | Admitting: Emergency Medicine

## 2021-10-29 DIAGNOSIS — I129 Hypertensive chronic kidney disease with stage 1 through stage 4 chronic kidney disease, or unspecified chronic kidney disease: Secondary | ICD-10-CM | POA: Insufficient documentation

## 2021-10-29 DIAGNOSIS — R456 Violent behavior: Secondary | ICD-10-CM | POA: Diagnosis present

## 2021-10-29 DIAGNOSIS — Z20822 Contact with and (suspected) exposure to covid-19: Secondary | ICD-10-CM | POA: Diagnosis not present

## 2021-10-29 DIAGNOSIS — Z9114 Patient's other noncompliance with medication regimen: Secondary | ICD-10-CM | POA: Insufficient documentation

## 2021-10-29 DIAGNOSIS — Z79899 Other long term (current) drug therapy: Secondary | ICD-10-CM | POA: Insufficient documentation

## 2021-10-29 DIAGNOSIS — N189 Chronic kidney disease, unspecified: Secondary | ICD-10-CM | POA: Diagnosis not present

## 2021-10-29 DIAGNOSIS — E1122 Type 2 diabetes mellitus with diabetic chronic kidney disease: Secondary | ICD-10-CM | POA: Diagnosis not present

## 2021-10-29 DIAGNOSIS — F209 Schizophrenia, unspecified: Secondary | ICD-10-CM | POA: Diagnosis not present

## 2021-10-29 LAB — CBC WITH DIFFERENTIAL/PLATELET
Abs Immature Granulocytes: 0.02 10*3/uL (ref 0.00–0.07)
Basophils Absolute: 0 10*3/uL (ref 0.0–0.1)
Basophils Relative: 0 %
Eosinophils Absolute: 0 10*3/uL (ref 0.0–0.5)
Eosinophils Relative: 1 %
HCT: 43.9 % (ref 39.0–52.0)
Hemoglobin: 13.9 g/dL (ref 13.0–17.0)
Immature Granulocytes: 0 %
Lymphocytes Relative: 21 %
Lymphs Abs: 1.2 10*3/uL (ref 0.7–4.0)
MCH: 28.2 pg (ref 26.0–34.0)
MCHC: 31.7 g/dL (ref 30.0–36.0)
MCV: 89 fL (ref 80.0–100.0)
Monocytes Absolute: 0.3 10*3/uL (ref 0.1–1.0)
Monocytes Relative: 6 %
Neutro Abs: 4 10*3/uL (ref 1.7–7.7)
Neutrophils Relative %: 72 %
Platelets: 256 10*3/uL (ref 150–400)
RBC: 4.93 MIL/uL (ref 4.22–5.81)
RDW: 13.2 % (ref 11.5–15.5)
WBC: 5.6 10*3/uL (ref 4.0–10.5)
nRBC: 0 % (ref 0.0–0.2)

## 2021-10-29 LAB — ETHANOL: Alcohol, Ethyl (B): <10 mg/dL (ref <10)

## 2021-10-29 LAB — URINE DRUG SCREEN, QUALITATIVE (ARMC ONLY)
Amphetamines, Ur Screen: NOT DETECTED
Barbiturates, Ur Screen: NOT DETECTED
Benzodiazepine, Ur Scrn: NOT DETECTED
Cannabinoid 50 Ng, Ur ~~LOC~~: NOT DETECTED
Cocaine Metabolite,Ur ~~LOC~~: NOT DETECTED
MDMA (Ecstasy)Ur Screen: NOT DETECTED
Methadone Scn, Ur: NOT DETECTED
Opiate, Ur Screen: NOT DETECTED
Phencyclidine (PCP) Ur S: NOT DETECTED
Tricyclic, Ur Screen: NOT DETECTED

## 2021-10-29 LAB — COMPREHENSIVE METABOLIC PANEL
ALT: 13 U/L (ref 0–44)
AST: 17 U/L (ref 15–41)
Albumin: 3.6 g/dL (ref 3.5–5.0)
Alkaline Phosphatase: 153 U/L — ABNORMAL HIGH (ref 38–126)
Anion gap: 6 (ref 5–15)
BUN: 24 mg/dL — ABNORMAL HIGH (ref 6–20)
CO2: 30 mmol/L (ref 22–32)
Calcium: 9.4 mg/dL (ref 8.9–10.3)
Chloride: 99 mmol/L (ref 98–111)
Creatinine, Ser: 1.31 mg/dL — ABNORMAL HIGH (ref 0.61–1.24)
GFR, Estimated: >60 mL/min (ref >60)
Glucose, Bld: 327 mg/dL — ABNORMAL HIGH (ref 70–99)
Potassium: 4.6 mmol/L (ref 3.5–5.1)
Sodium: 135 mmol/L (ref 135–145)
Total Bilirubin: 0.7 mg/dL (ref 0.3–1.2)
Total Protein: 8.1 g/dL (ref 6.5–8.1)

## 2021-10-29 LAB — RESP PANEL BY RT-PCR (FLU A&B, COVID) ARPGX2
Influenza A by PCR: NEGATIVE
Influenza B by PCR: NEGATIVE
SARS Coronavirus 2 by RT PCR: NEGATIVE

## 2021-10-29 LAB — CBG MONITORING, ED: Glucose-Capillary: 352 mg/dL — ABNORMAL HIGH (ref 70–99)

## 2021-10-29 LAB — ACETAMINOPHEN LEVEL: Acetaminophen (Tylenol), Serum: <10 ug/mL — ABNORMAL LOW (ref 10–30)

## 2021-10-29 LAB — SALICYLATE LEVEL: Salicylate Lvl: <7 mg/dL — ABNORMAL LOW (ref 7.0–30.0)

## 2021-10-29 MED ORDER — METFORMIN HCL 500 MG PO TABS
1000.0000 mg | ORAL_TABLET | Freq: Two times a day (BID) | ORAL | Status: DC
  Administered 2021-10-30 – 2021-10-31 (×2): 1000 mg via ORAL
  Filled 2021-10-29 (×2): qty 2

## 2021-10-29 MED ORDER — METFORMIN HCL 500 MG PO TABS: 1000.0000 mg | ORAL_TABLET | Freq: Two times a day (BID) | ORAL | Status: DC

## 2021-10-29 MED ORDER — PRAVASTATIN SODIUM 20 MG PO TABS
40.0000 mg | ORAL_TABLET | Freq: Every day | ORAL | Status: DC
  Administered 2021-10-30: 40 mg via ORAL
  Filled 2021-10-29: qty 2

## 2021-10-29 MED ORDER — INSULIN GLARGINE-YFGN 100 UNIT/ML ~~LOC~~ SOLN
20.0000 [IU] | Freq: Every day | SUBCUTANEOUS | Status: DC
  Administered 2021-10-29 – 2021-10-30 (×2): 20 [IU] via SUBCUTANEOUS
  Filled 2021-10-29 (×3): qty 0.2

## 2021-10-29 MED ORDER — PANTOPRAZOLE SODIUM 40 MG PO TBEC
40.0000 mg | DELAYED_RELEASE_TABLET | Freq: Every day | ORAL | Status: DC
  Administered 2021-10-29 – 2021-10-31 (×3): 40 mg via ORAL
  Filled 2021-10-29 (×3): qty 1

## 2021-10-29 MED ORDER — AMLODIPINE BESYLATE 5 MG PO TABS
5.0000 mg | ORAL_TABLET | Freq: Every day | ORAL | Status: DC
  Administered 2021-10-29 – 2021-10-31 (×3): 5 mg via ORAL
  Filled 2021-10-29 (×3): qty 1

## 2021-10-29 MED ORDER — GABAPENTIN 100 MG PO CAPS
200.0000 mg | ORAL_CAPSULE | Freq: Every day | ORAL | Status: DC
  Administered 2021-10-29 – 2021-10-30 (×2): 200 mg via ORAL
  Filled 2021-10-29 (×2): qty 2

## 2021-10-29 MED ORDER — PRAMIPEXOLE DIHYDROCHLORIDE 0.25 MG PO TABS
0.5000 mg | ORAL_TABLET | Freq: Three times a day (TID) | ORAL | Status: DC
  Administered 2021-10-30 – 2021-10-31 (×4): 0.5 mg via ORAL
  Filled 2021-10-29 (×7): qty 2

## 2021-10-29 MED ORDER — PALIPERIDONE ER 3 MG PO TB24
3.0000 mg | ORAL_TABLET | Freq: Every day | ORAL | Status: DC
  Administered 2021-10-29 – 2021-10-30 (×2): 3 mg via ORAL
  Filled 2021-10-29 (×4): qty 1

## 2021-10-29 MED ORDER — ASPIRIN EC 81 MG PO TBEC
81.0000 mg | DELAYED_RELEASE_TABLET | Freq: Every day | ORAL | Status: DC
  Administered 2021-10-29 – 2021-10-31 (×3): 81 mg via ORAL
  Filled 2021-10-29 (×3): qty 1

## 2021-10-29 NOTE — ED Notes (Signed)
Pt is asleep at this time with equal rise and fall of the chest. Will continue to monitor. 
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
## 2021-10-29 NOTE — ED Triage Notes (Signed)
Pt guardian/sister/caregiver:  ?Victorino [phone] ?Please call with questions ?

## 2021-10-30 DIAGNOSIS — F209 Schizophrenia, unspecified: Secondary | ICD-10-CM | POA: Diagnosis not present

## 2021-10-30 MED ORDER — PALIPERIDONE PALMITATE ER 156 MG/ML IM SUSY
156.0000 mg | PREFILLED_SYRINGE | Freq: Once | INTRAMUSCULAR | Status: AC
  Administered 2021-10-30: 156 mg via INTRAMUSCULAR
  Filled 2021-10-30: qty 1

## 2021-10-30 MED ORDER — PALIPERIDONE PALMITATE ER 234 MG/1.5ML IM SUSY
234.0000 mg | PREFILLED_SYRINGE | Freq: Once | INTRAMUSCULAR | Status: DC
Start: 2021-10-30 — End: 2021-10-30
  Filled 2021-10-30: qty 1.5

## 2021-10-30 NOTE — ED Notes (Signed)
Awaiting decision on Invega for dosing per pharmacy notification that dosing needs follow-up by physician.  ?

## 2021-10-30 NOTE — ED Notes (Signed)
VOl pending placement 

## 2021-10-30 NOTE — ED Notes (Signed)
Lunch tray given. 

## 2021-10-30 NOTE — ED Notes (Signed)
Pt moans out at times.   ?

## 2021-10-30 NOTE — ED Notes (Addendum)
Social worker in to see patient. Discussed with social worker that patient has willingly been taking medications without any difficulty and is cooperative in his care.  ?

## 2021-10-30 NOTE — BH Assessment (Signed)
Referral information for Psychiatric Hospitalization faxed to: ? ?[facility] [phone]),  ? ?[facility] [phone]) [phone] ? ?[name] [phone]), ? ?[facility] [phone] or [phone]) ? ?[name] [phone] -[phone]),  ? ?[name] [phone]). ? ?[facility] [phone]) ? ?

## 2021-10-30 NOTE — ED Notes (Signed)
Meds given.  Pt calm and cooperative. °

## 2021-10-30 NOTE — Progress Notes (Signed)
Inpatient Diabetes Program Recommendations ? ?AACE/ADA: New Consensus Statement on Inpatient Glycemic Control  ? ?Target Ranges:  Prepandial:   less than 140 mg/dL ?     Peak postprandial:   less than 180 mg/dL (1-2 hours) ?     Critically ill patients:  140 - 180 mg/dL  ? ? Latest Reference Range & Units 10/29/21 22:01  ?Glucose-Capillary 70 - 99 mg/dL 712 (H)  ? ? Latest Reference Range & Units 10/29/21 16:44  ?Glucose 70 - 99 mg/dL 197 (H)  ? ?Review of Glycemic Control ? ?Diabetes history: DM2 ?Outpatient Diabetes medications: Lantus 20 units QHS, Metformin 1000 mg BID ?Current orders for Inpatient glycemic control: Semglee 20 units QHS, Metformin 1000 mg BID ? ?Inpatient Diabetes Program Recommendations:   ? ?Insulin: Please consider ordering CBGs AC&HS and Novolog 0-15 units TID with meals and Novolog 0-5 units QHS. ? ?Thanks, ?Orlando Penner, RN, MSN, CDE ?Diabetes Coordinator ?Inpatient Diabetes Program ?[phone] (Team Pager from 8am to 5pm) ? ? ? ?

## 2021-10-30 NOTE — BH Assessment (Signed)
Please be advised: Per nursing, pt unable to be accepted to [facility] at this time due to unit acuity. ?

## 2021-10-30 NOTE — ED Provider Notes (Signed)
Emergency Medicine Observation Re-evaluation Note ? ?Master Touchet is a 61 y.o. male, seen on rounds today.  Pt initially presented to the ED for complaints of Mental Health Problem ?Currently, the patient is resting without complaints. ? ?Physical Exam  ?BP (!) 176/82   Pulse 84   Temp 98.4 ?F (36.9 ?C) (Oral)   Resp 20   Ht 4\' 2"  (1.27 m)   Wt 90 kg   SpO2 98%   BMI 55.80 kg/m?  ?Physical Exam ?Gen: No acute distress  ?Resp: Normal rise and fall of chest ?Neuro: Moving all four extremities ?Psych: Resting currently, calm and cooperative when awake ? ? ? ?ED Course / MDM  ?EKG:  ? ?I have reviewed the labs performed to date as well as medications administered while in observation.  Recent changes in the last 24 hours include no acute events overnight. ? ?Plan  ?Current plan is for inpatient psychiatric treatment. ? Cord Wilczynski is not under involuntary commitment. ? ? ?  ?Aleyna Cueva, Winfield Cunas, DO ?10/30/21 0359 ? ?

## 2021-10-30 NOTE — BH Assessment (Signed)
Referral checks: ?  ?[facility] [phone]), No answer ?  ?[facility] [phone]- [phone]) [phone] fx No answer ?  ?[place] [phone]), No intake staff available after business hours. ?  ?[facility] [phone] or [phone]) Left a voicemail requesting a call back. ?  ?[name] [phone] -or- [phone]), Per Cassandra, pt denied due to having IDD dx. ?  ?[name] [phone]). Unable to reach anyone. ?  ?[facility] [phone]) Per Dahlia Client, pt denied pt due to acuity. ?  ?

## 2021-10-30 NOTE — TOC Progression Note (Signed)
Transition of Care (TOC) - Progression Note  ? ? ?Patient Details  ?Name: Cire Clute ?MRN: [id number] ?Date of Birth: 11/17/1960 ? ?Transition of Care (TOC) CM/SW Contact  ?Allayne Butcher, RN ?Phone Number: ?10/30/2021, 12:05 PM ? ?Clinical Narrative:    ?RNCM spoke with patient's sister [name]- [phone].  She reports she is the patient's legal guardian and has the paperwork at home.  [employer] DSS helped her get guardianship.   ?She reports she loves her brother and does not want to put him in a home.  He was going to [facility] clinic but they no longer accept his insurance so he has not had his Invega injection in a while.   ?Sister reports patient is not at his baseline, he does not want to bathe, or let her trim his toenails, he has been more aggressive towards her. ? ?She loves her brother and wants to care for him.  Patient has not been evaluated by psychiatry yet.  ? ? ?  ?  ? ?Expected Discharge Plan and Services ?  ?  ?  ?  ?  ?                ?  ?  ?  ?  ?  ?  ?  ?  ?  ?  ? ? ?Social Determinants of Health (SDOH) Interventions ?  ? ?Readmission Risk Interventions ?No flowsheet data found. ? ?

## 2021-10-30 NOTE — ED Notes (Addendum)
Patient moved to room 22 to await placement for psychiatric services. Patient was dressed out by this RN and Solmon Ice, EDT. Patient refused to take off underwear (patient is voluntary at this time). Dr. Kerman Passey ok'd wanding of patient d/t refusal to remove underwear. Patient wanded by officer in quad with no belongings detected. Belongings: 1 pair black/grey shoes, 1 dark blue tshirt, 1 grey and black beanie hat, 1 black sweat pants, 1 black jacket, 1 watch with black band.  ?

## 2021-10-30 NOTE — TOC Progression Note (Signed)
Transition of Care (TOC) - Progression Note  ? ? ?Patient Details  ?Name: Riese Hellard ?MRN: [id number] ?Date of Birth: 18-Jun-1961 ? ?Transition of Care (TOC) CM/SW Contact  ?Allayne Butcher, RN ?Phone Number: ?10/30/2021, 8:54 AM ? ?Clinical Narrative:    ?Patient is currently waiting for inpatient psychiatric facility. ? ?No TOC needs at this time.  ? ? ?  ?  ? ?Expected Discharge Plan and Services ?  ?  ?  ?  ?  ?                ?  ?  ?  ?  ?  ?  ?  ?  ?  ?  ? ? ?Social Determinants of Health (SDOH) Interventions ?  ? ?Readmission Risk Interventions ?No flowsheet data found. ? ?

## 2021-10-30 NOTE — ED Notes (Signed)
Dinner tray given

## 2021-10-30 NOTE — ED Notes (Signed)
Resumed care from jen I rn  pt alert, calm, lying on bed.  ?

## 2021-10-30 NOTE — ED Notes (Signed)
Sister/guardian visited patient without incident with patient relations member as Merchandiser, retail of visit.

## 2021-10-30 NOTE — ED Notes (Signed)
VOL/pending placement 

## 2021-10-31 DIAGNOSIS — F209 Schizophrenia, unspecified: Secondary | ICD-10-CM | POA: Diagnosis not present

## 2021-10-31 LAB — CBG MONITORING, ED: Glucose-Capillary: 226 mg/dL — ABNORMAL HIGH (ref 70–99)

## 2021-10-31 NOTE — ED Notes (Signed)
VOL/No Psych Consult ordered/ CSW Evaluation done/ Pending Placement  ?

## 2021-10-31 NOTE — ED Notes (Addendum)
Report received from [facility], English as a second language teacher. On initial round after report Pt is warm/dry, resting quietly in room without any s/s of distress, pt loudly moans out intermittently.  Will continue to monitor throughout shift as ordered for any changes in behaviors and for continued safety.   ?

## 2021-10-31 NOTE — ED Notes (Signed)
Pt given 0800 medications and vitals taken w/o incident.  Pt is not verbal.  Staff to encourage pt to preform ADLs ?

## 2021-10-31 NOTE — ED Notes (Signed)
TOC at bedside 

## 2021-10-31 NOTE — Discharge Instructions (Addendum)
Please seek medical attention and help for any thoughts about wanting to harm yourself, harm others, any concerning change in behavior, severe depression, inappropriate drug use or any other new or concerning symptoms. ° °

## 2021-10-31 NOTE — ED Notes (Addendum)
Pt ready to discharge, called guardian/sister [employer] [phone].  Phone straight to vm, secure VM left ?

## 2021-10-31 NOTE — TOC Progression Note (Signed)
Transition of Care (TOC) - Progression Note  ? ? ?Patient Details  ?Name: Larry Cook ?MRN: [id number] ?Date of Birth: 01/15/1961 ? ?Transition of Care (TOC) CM/SW Contact  ?Allayne Butcher, RN ?Phone Number: ?10/31/2021, 4:12 PM ? ?Clinical Narrative:    ?Patient discharged back home with sister, Larry Cook, his legal guardian.  Patient received Invega injection while here yesterday.  Patient does need mental health follow up.  Patient has TCM- tailored case management through [facility]- reached out to [place] at [facility], [phone].  Milagros Loll reports that she will reach out to the sister, Velvet and try to get an assessment completed and get patient set up with a provider.   ? ?RNCM also spoke with Nancy Fetter [facility] DSS [phone] or [phone].  Joy will check in with patient and sister at home.  Sister has apparently voiced to DSS in the past that she wanted the patient placed - but she told this RNCM that she wanted to care for him at home.   ? ? ?  ?  ? ?Expected Discharge Plan and Services ?  ?  ?  ?  ?  ?                ?  ?  ?  ?  ?  ?  ?  ?  ?  ?  ? ? ?Social Determinants of Health (SDOH) Interventions ?  ? ?Readmission Risk Interventions ?No flowsheet data found. ? ?

## 2021-10-31 NOTE — ED Notes (Signed)
Guardian [employer] contacted, informed staff that she had two teeth pulled and would be sending her daughter to pick up Mr Franson at 11am ?

## 2021-10-31 NOTE — ED Notes (Signed)
Pt up to bathroom.

## 2021-10-31 NOTE — ED Notes (Signed)
Hospital breakfast meal provided with milk, pt tolerated w/o complaints.   ?
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
## 2023-02-23 ENCOUNTER — Other Ambulatory Visit: Payer: Self-pay

## 2023-02-23 ENCOUNTER — Emergency Department
Admission: EM | Admit: 2023-02-23 | Discharge: 2023-05-17 | Disposition: A | Discharge status: Home / Self Care | Payer: 59 | Attending: Emergency Medicine | Admitting: Emergency Medicine

## 2023-02-23 DIAGNOSIS — F209 Schizophrenia, unspecified: Secondary | ICD-10-CM | POA: Diagnosis present

## 2023-02-23 DIAGNOSIS — R456 Violent behavior: Secondary | ICD-10-CM | POA: Diagnosis not present

## 2023-02-23 DIAGNOSIS — F79 Unspecified intellectual disabilities: Secondary | ICD-10-CM | POA: Diagnosis not present

## 2023-02-23 DIAGNOSIS — E119 Type 2 diabetes mellitus without complications: Secondary | ICD-10-CM | POA: Diagnosis not present

## 2023-02-23 DIAGNOSIS — F203 Undifferentiated schizophrenia: Secondary | ICD-10-CM | POA: Diagnosis not present

## 2023-02-23 DIAGNOSIS — F840 Autistic disorder: Secondary | ICD-10-CM | POA: Insufficient documentation

## 2023-02-23 DIAGNOSIS — F201 Disorganized schizophrenia: Secondary | ICD-10-CM | POA: Diagnosis not present

## 2023-02-23 DIAGNOSIS — Z6841 Body Mass Index (BMI) 40.0 and over, adult: Secondary | ICD-10-CM | POA: Diagnosis not present

## 2023-02-23 DIAGNOSIS — R4689 Other symptoms and signs involving appearance and behavior: Secondary | ICD-10-CM | POA: Diagnosis present

## 2023-02-23 LAB — CBC
HCT: 42.3 % (ref 39.0–52.0)
Hemoglobin: 13.8 g/dL (ref 13.0–17.0)
MCH: 29.4 pg (ref 26.0–34.0)
MCHC: 32.6 g/dL (ref 30.0–36.0)
MCV: 90 fL (ref 80.0–100.0)
Platelets: 263 10*3/uL (ref 150–400)
RBC: 4.7 MIL/uL (ref 4.22–5.81)
RDW: 13.5 % (ref 11.5–15.5)
WBC: 6.3 10*3/uL (ref 4.0–10.5)
nRBC: 0 % (ref 0.0–0.2)

## 2023-02-23 LAB — COMPREHENSIVE METABOLIC PANEL
ALT: 13 U/L (ref 0–44)
AST: 18 U/L (ref 15–41)
Albumin: 3.8 g/dL (ref 3.5–5.0)
Alkaline Phosphatase: 163 U/L — ABNORMAL HIGH (ref 38–126)
Anion gap: 11 (ref 5–15)
BUN: 19 mg/dL (ref 8–23)
CO2: 24 mmol/L (ref 22–32)
Calcium: 9.4 mg/dL (ref 8.9–10.3)
Chloride: 95 mmol/L — ABNORMAL LOW (ref 98–111)
Creatinine, Ser: 1.55 mg/dL — ABNORMAL HIGH (ref 0.61–1.24)
GFR, Estimated: 51 mL/min — ABNORMAL LOW (ref >60)
Glucose, Bld: 485 mg/dL — ABNORMAL HIGH (ref 70–99)
Potassium: 4.4 mmol/L (ref 3.5–5.1)
Sodium: 130 mmol/L — ABNORMAL LOW (ref 135–145)
Total Bilirubin: 0.6 mg/dL (ref 0.3–1.2)
Total Protein: 8.4 g/dL — ABNORMAL HIGH (ref 6.5–8.1)

## 2023-02-23 LAB — ACETAMINOPHEN LEVEL: Acetaminophen (Tylenol), Serum: <10 ug/mL — ABNORMAL LOW (ref 10–30)

## 2023-02-23 LAB — SALICYLATE LEVEL: Salicylate Lvl: <7 mg/dL — ABNORMAL LOW (ref 7.0–30.0)

## 2023-02-23 LAB — ETHANOL: Alcohol, Ethyl (B): <10 mg/dL (ref <10)

## 2023-02-23 NOTE — ED Notes (Signed)
Pt dressed ou t  Black pants Blue boxers [employer]
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
## 2023-02-23 NOTE — BH Assessment (Signed)
This Clinical research associate attempted to assess pt alongside psych NP Rashaun D. Pt was unable to participate in evaluation due to being nonverbal.   Per psych NP Rashaun D., pt is recommended for inpatient treatment.

## 2023-02-23 NOTE — ED Notes (Signed)
TTS and Psych NP attempted to talk with patient.
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
## 2023-02-23 NOTE — ED Notes (Signed)
IVC/pending psych consult 
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
## 2023-02-24 LAB — URINE DRUG SCREEN, QUALITATIVE (ARMC ONLY)
Amphetamines, Ur Screen: NOT DETECTED
Barbiturates, Ur Screen: NOT DETECTED
Benzodiazepine, Ur Scrn: NOT DETECTED
Cannabinoid 50 Ng, Ur ~~LOC~~: NOT DETECTED
Cocaine Metabolite,Ur ~~LOC~~: NOT DETECTED
MDMA (Ecstasy)Ur Screen: NOT DETECTED
Methadone Scn, Ur: NOT DETECTED
Opiate, Ur Screen: NOT DETECTED
Phencyclidine (PCP) Ur S: NOT DETECTED
Tricyclic, Ur Screen: NOT DETECTED

## 2023-02-24 NOTE — ED Notes (Signed)
EDP notified of BP systolic in the 170s.

## 2023-02-24 NOTE — ED Notes (Signed)
Gave pt a warm blanket and sprite. Declined snack when offered.

## 2023-02-24 NOTE — ED Notes (Signed)
This obtained vital signs on pt.  

## 2023-02-24 NOTE — BH Assessment (Signed)
Per [facility] Alcario Drought), patient to be referred out of system.  Referral information for Psychiatric Hospitalization faxed to;   [facility] ([phone]- [phone]),   [facility] [phone] -[phone]) [phone]  [name] [phone]),  [address] [phone]),   [name] [phone] -or- [phone]),   [facility] [phone]),  [place] [phone] or [phone]),

## 2023-02-24 NOTE — ED Notes (Signed)
Hospital meal provided, pt tolerated w/o complaints.  Waste discarded appropriately.  

## 2023-02-24 NOTE — ED Notes (Signed)
IVC/pending psych inpatient when medically cleared 

## 2023-02-24 NOTE — ED Notes (Signed)
Breakfast tray given. °

## 2023-02-25 NOTE — ED Notes (Signed)
Breakfast tray and juice provided. Pt sitting up on side of stretcher with no distress noted.

## 2023-02-25 NOTE — ED Notes (Signed)
IVC/  PENDING  PLACEMENT 

## 2023-02-25 NOTE — ED Notes (Signed)
Lunch tray and water provided  

## 2023-02-25 NOTE — ED Notes (Signed)
Dinner tray provided

## 2023-02-26 DIAGNOSIS — F209 Schizophrenia, unspecified: Secondary | ICD-10-CM | POA: Diagnosis not present

## 2023-02-26 LAB — CBG MONITORING, ED
Glucose-Capillary: 497 mg/dL — ABNORMAL HIGH (ref 70–99)
Glucose-Capillary: 500 mg/dL — ABNORMAL HIGH (ref 70–99)
Glucose-Capillary: 354 mg/dL — ABNORMAL HIGH (ref 70–99)
Glucose-Capillary: 333 mg/dL — ABNORMAL HIGH (ref 70–99)
Glucose-Capillary: 296 mg/dL — ABNORMAL HIGH (ref 70–99)

## 2023-02-26 MED ORDER — METFORMIN HCL 500 MG PO TABS
500.0000 mg | ORAL_TABLET | Freq: Once | ORAL | Status: DC
  Filled 2023-02-26: qty 1

## 2023-02-26 MED ORDER — PALIPERIDONE ER 3 MG PO TB24
3.0000 mg | ORAL_TABLET | Freq: Every day | ORAL | Status: DC
  Administered 2023-02-26 – 2023-02-28 (×3): 3 mg via ORAL
  Filled 2023-02-26 (×4): qty 1

## 2023-02-26 MED ORDER — METFORMIN HCL 500 MG PO TABS
1000.0000 mg | ORAL_TABLET | Freq: Two times a day (BID) | ORAL | Status: DC
  Administered 2023-02-27 – 2023-05-17 (×148): 1000 mg via ORAL
  Filled 2023-02-26 (×155): qty 2

## 2023-02-26 MED ORDER — INSULIN ASPART 100 UNIT/ML IJ SOLN
0.0000 [IU] | Freq: Three times a day (TID) | INTRAMUSCULAR | Status: DC
  Administered 2023-02-26 – 2023-02-27 (×2): 15 [IU] via SUBCUTANEOUS
  Administered 2023-02-27: 7 [IU] via SUBCUTANEOUS
  Administered 2023-02-27 – 2023-03-01 (×3): 4 [IU] via SUBCUTANEOUS
  Administered 2023-03-03 – 2023-03-04 (×2): 3 [IU] via SUBCUTANEOUS
  Administered 2023-03-05: 4 [IU] via SUBCUTANEOUS
  Administered 2023-03-06 – 2023-03-11 (×5): 3 [IU] via SUBCUTANEOUS
  Filled 2023-02-26 (×19): qty 1

## 2023-02-26 MED ORDER — SODIUM CHLORIDE 0.9 % IV BOLUS
1000.0000 mL | Freq: Once | INTRAVENOUS | Status: AC
  Administered 2023-02-26: 1000 mL via INTRAVENOUS

## 2023-02-26 MED ORDER — INSULIN GLARGINE-YFGN 100 UNIT/ML ~~LOC~~ SOLN
20.0000 [IU] | SUBCUTANEOUS | Status: DC
  Administered 2023-02-26 – 2023-02-27 (×2): 20 [IU] via SUBCUTANEOUS
  Filled 2023-02-26 (×2): qty 0.2

## 2023-02-26 MED ORDER — INSULIN ASPART 100 UNIT/ML IJ SOLN
0.0000 [IU] | Freq: Every day | INTRAMUSCULAR | Status: DC
  Administered 2023-03-02 – 2023-04-03 (×3): 2 [IU] via SUBCUTANEOUS
  Filled 2023-02-26 (×10): qty 1

## 2023-02-26 MED ORDER — INSULIN ASPART 100 UNIT/ML IJ SOLN
10.0000 [IU] | Freq: Once | INTRAMUSCULAR | Status: AC
  Administered 2023-02-26: 10 [IU] via INTRAVENOUS
  Filled 2023-02-26: qty 1

## 2023-02-26 NOTE — BH Assessment (Signed)
Adult/GERO MH  Referral information for Psychiatric Hospitalization faxed to:   [facility] ([phone]---[phone])   [facility] [phone]- [phone], [phone])   [place] ([phone] or [phone]),  . [facility] ([phone] -or- [phone]), 

## 2023-02-26 NOTE — ED Notes (Signed)
Pt given dinner tray and beverage  

## 2023-02-26 NOTE — ED Notes (Signed)
EDP goodman informed pt refused PO metformin

## 2023-02-26 NOTE — ED Notes (Signed)
Hospital meal provided, pt tolerated w/o complaints.  Waste discarded appropriately.  

## 2023-02-26 NOTE — ED Notes (Signed)
EDP Derrill Kay informed of most recent blood sugar.

## 2023-02-26 NOTE — ED Notes (Signed)
EDP Ray aware that pt refused metformin.

## 2023-02-26 NOTE — Inpatient Diabetes Management (Signed)
Inpatient Diabetes Program Recommendations  AACE/ADA: New Consensus Statement on Inpatient Glycemic Control   Target Ranges:  Prepandial:   less than 140 mg/dL      Peak postprandial:   less than 180 mg/dL (1-2 hours)      Critically ill patients:  140 - 180 mg/dL    Latest Reference Range & Units 02/26/23 13:02 02/26/23 14:03  Glucose-Capillary 70 - 99 mg/dL 562 (H) 130 (H)    Latest Reference Range & Units 02/23/23 20:31  Glucose 70 - 99 mg/dL 865 (H)   Review of Glycemic Control  Diabetes history: DM2 Outpatient Diabetes medications: None listed on home med list; per PCP note on 08/02/22 was prescribed Lantus 50 units at bedtime and Metformin 1000 mg BID Current orders for Inpatient glycemic control: Metformin 500 mg one time order  Inpatient Diabetes Program Recommendations:    Insulin: Please consider ordering Semglee 20 units Q24H, CBGs AC&HS, Novolog 0-20 units TID with meals, Novolog 0-5 units at bedtime, and Metformin 1000 mg BID.  Thanks, Orlando Penner, RN, MSN, CDCES Diabetes Coordinator Inpatient Diabetes Program [phone] (Team Pager from 8am to 5pm)

## 2023-02-26 NOTE — ED Notes (Signed)
This NT provided pt with pm snack. 

## 2023-02-26 NOTE — ED Notes (Signed)
IVC/  PENDING  PLACEMENT 

## 2023-02-26 NOTE — ED Notes (Signed)
EDP Goodman notified of pt CBG. Verbal orders received.

## 2023-02-26 NOTE — ED Notes (Signed)
ivc/pending placement.. 
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
## 2023-02-26 NOTE — ED Notes (Addendum)
Pt heard yelling and groaning in his room with the door closed. Pt provided fresh ice water in a foam cup upon request. Pt denied any further needs.

## 2023-02-26 NOTE — ED Provider Notes (Signed)
Emergency Medicine Observation Re-evaluation Note  Friend Vanderpoel is a 62 y.o. male, seen on rounds today.  Pt initially presented to the ED for complaints of Psychiatric Evaluation (IVC) Currently, the patient is awaiting dispo.  Physical Exam  BP (!) 148/82 (BP Location: Right Arm)   Pulse 79   Temp 98.3 F (36.8 C) (Oral)   Resp 18   Ht 4\' 2"  (1.27 m)   Wt 90 kg   SpO2 94%   BMI 55.80 kg/m  Physical Exam General: resting comfortably  ED Course / MDM  No new labs in past 24 hours  Plan  Current plan is for dispo.    Phineas Semen, MD 02/26/23 [phone]

## 2023-02-27 DIAGNOSIS — F209 Schizophrenia, unspecified: Secondary | ICD-10-CM | POA: Diagnosis not present

## 2023-02-27 LAB — CBG MONITORING, ED
Glucose-Capillary: 202 mg/dL — ABNORMAL HIGH (ref 70–99)
Glucose-Capillary: 198 mg/dL — ABNORMAL HIGH (ref 70–99)
Glucose-Capillary: 301 mg/dL — ABNORMAL HIGH (ref 70–99)
Glucose-Capillary: 71 mg/dL (ref 70–99)

## 2023-02-27 MED ORDER — INSULIN ASPART 100 UNIT/ML IJ SOLN
6.0000 [IU] | Freq: Three times a day (TID) | INTRAMUSCULAR | Status: DC
  Administered 2023-02-27 – 2023-03-04 (×7): 6 [IU] via SUBCUTANEOUS
  Filled 2023-02-27 (×13): qty 1

## 2023-02-27 MED ORDER — INSULIN GLARGINE-YFGN 100 UNIT/ML ~~LOC~~ SOLN
25.0000 [IU] | SUBCUTANEOUS | Status: DC
  Administered 2023-02-28 – 2023-03-11 (×10): 25 [IU] via SUBCUTANEOUS
  Filled 2023-02-27 (×12): qty 0.25

## 2023-02-27 NOTE — ED Provider Notes (Addendum)
Emergency Medicine Observation Re-evaluation Note  Larry Cook is a 62 y.o. male, seen on rounds today.  Pt initially presented to the ED for complaints of Psychiatric Evaluation (IVC)  Currently, the patient is is no acute distress. Denies any concerns at this time.  Physical Exam  Blood pressure (!) 128/100, pulse 80, temperature 97.7 F (36.5 C), temperature source Oral, resp. rate 18, height 4\' 2"  (1.27 m), weight 90 kg, SpO2 98 %.  Physical Exam: General: No apparent distress Pulm: Normal WOB Neuro: Moving all extremities Psych: Resting comfortably -sitting in hallway bed     ED Course / MDM     I have reviewed the labs performed to date as well as medications administered while in observation.  Recent changes in the last 24 hours include: No acute events overnight.  Made changes to patient's diabetic medications based on recommendations by diabetic coordinator  Plan   Current plan: Patient awaiting psychiatric disposition. Patient is under full IVC at this time.    Corena Herter, MD 02/27/23 1428    Corena Herter, MD 02/27/23 1437

## 2023-02-27 NOTE — ED Notes (Signed)
Snack provided

## 2023-02-27 NOTE — ED Notes (Signed)
INVOLUNTARY continues to await placement °

## 2023-02-27 NOTE — ED Notes (Signed)
Snack time given with a cup of water.

## 2023-02-27 NOTE — ED Notes (Signed)
Telepsych done with NP, Patient was calm, He did not speak , just rolled eyes and seemed uninterested.

## 2023-02-27 NOTE — ED Notes (Signed)
This nurse gave report regarding Patient that is transferring to the [facility].

## 2023-02-27 NOTE — ED Notes (Signed)
Dinner tray given to pt
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
## 2023-02-27 NOTE — ED Notes (Signed)
Tech attempted to obtain blood sugar several times and He refused, He finally agreed, Patient ate late breakfast, ate 100 of meal and beverage.

## 2023-02-27 NOTE — ED Notes (Signed)
Pt in bathroom

## 2023-02-27 NOTE — Inpatient Diabetes Management (Signed)
Inpatient Diabetes Program Recommendations  AACE/ADA: New Consensus Statement on Inpatient Glycemic Control   Target Ranges:  Prepandial:   less than 140 mg/dL      Peak postprandial:   less than 180 mg/dL (1-2 hours)      Critically ill patients:  140 - 180 mg/dL    Latest Reference Range & Units 02/26/23 13:02 02/26/23 14:03 02/26/23 14:56 02/26/23 16:09 02/26/23 21:13 02/27/23 10:27  Glucose-Capillary 70 - 99 mg/dL 098 (H) 119 (H) 147 (H) 333 (H) 296 (H) 202 (H)    Review of Glycemic Control  Diabetes history: DM2 Outpatient Diabetes medications: None listed on home med list; per PCP note on 08/02/22 was prescribed Lantus 50 units at bedtime and Metformin 1000 mg BID Current orders for Inpatient glycemic control: Semglee 20 units Q24H, Novolog 0-20 units TID with meals, Novolog 0-5 units at bedtime, Metformin 1000 mg BID  Inpatient Diabetes Program Recommendations:    Insulin: Please consider increasing Semglee to 25 units Q24H and ordering Novolog 6 units TID with meals for meal coverage if patient eats at least 50% of meals.  Thanks, Orlando Penner, RN, MSN, CDCES Diabetes Coordinator Inpatient Diabetes Program [phone] (Team Pager from 8am to 5pm)

## 2023-02-28 DIAGNOSIS — F209 Schizophrenia, unspecified: Secondary | ICD-10-CM | POA: Diagnosis not present

## 2023-02-28 LAB — CBG MONITORING, ED
Glucose-Capillary: 158 mg/dL — ABNORMAL HIGH (ref 70–99)
Glucose-Capillary: 172 mg/dL — ABNORMAL HIGH (ref 70–99)
Glucose-Capillary: 95 mg/dL (ref 70–99)
Glucose-Capillary: 297 mg/dL — ABNORMAL HIGH (ref 70–99)

## 2023-02-28 MED ORDER — LORAZEPAM 1 MG PO TABS: 1.0000 mg | ORAL_TABLET | Freq: Two times a day (BID) | ORAL | Status: DC

## 2023-02-28 MED ORDER — LORAZEPAM 1 MG PO TABS
1.0000 mg | ORAL_TABLET | Freq: Two times a day (BID) | ORAL | Status: DC | PRN
  Administered 2023-04-05: 1 mg via ORAL
  Filled 2023-02-28 (×3): qty 1

## 2023-02-28 NOTE — ED Notes (Addendum)
Patient moved to [facility] with no issues.

## 2023-02-28 NOTE — ED Notes (Signed)
Patient being moved to [facility] due to being disruptive on unit.

## 2023-02-28 NOTE — ED Notes (Signed)
EDP Roxan Hockey informed that pt is refusing his lunch time dose of insulin.

## 2023-02-28 NOTE — ED Notes (Signed)
IVC/  PENDING  PLACEMENT 

## 2023-02-28 NOTE — ED Notes (Addendum)
Hospital meal provided, pt tolerated w/o complaints.  Waste discarded appropriately Pt denied shower stated "bad"

## 2023-02-28 NOTE — ED Notes (Signed)
Meal tray given to pt.

## 2023-02-28 NOTE — ED Notes (Signed)
Hospital meal provided.  100% consumed, pt tolerated w/o complaints.  Waste discarded appropriately.   

## 2023-02-28 NOTE — BH Assessment (Signed)
Per [facility] (Brook M,), patient to be referred out of system.  Referral information for Psychiatric Hospitalization faxed to;   [facility] ([phone]-or- [phone]), No available bed.  [facility] ([phone]-or- [phone]),   [facility] ([phone]),  [place] ([phone]--- [phone]--- [phone]--- [phone])  [place] ([phone]),   [facility] ([phone] -or- [phone]),   [facility] ([phone]),  [place] [facility] ([phone])  [place] ([phone] or [phone]),   [place] ([phone]).  [facility] ([phone])  

## 2023-02-28 NOTE — ED Notes (Signed)
Pt repetitively refused meds stating "that makes me feel sick".

## 2023-02-28 NOTE — ED Notes (Signed)
RN attempted two different times to given patient ordered insulin. Pt adamantly refusing, unable to provide answer to why.

## 2023-02-28 NOTE — Progress Notes (Signed)
TOC received consult for d/c planning. Referrals are being sent our for Psychiatric Hospitalization for pt. Cm will continue to follow for needs.

## 2023-02-28 NOTE — ED Provider Notes (Signed)
Emergency Medicine Observation Re-evaluation Note  Larry Cook is a 62 y.o. male, seen on rounds today.  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    Plan  Current plan is for psych.    Willy Eddy, MD 02/28/23 [phone]

## 2023-02-28 NOTE — ED Notes (Signed)
Patient refusing HS medication. Writer explained the importance of taking HS insulin. Patient continued to refuse.

## 2023-02-28 NOTE — ED Notes (Signed)
Pt sitting on side of bed and yelling out. Staff to room and pt states "bad" but nothing else. Educated on need to not do this and volume. Pt won't speak to nurse and just looks at staff.

## 2023-02-28 NOTE — ED Notes (Signed)
Pm snack provided 

## 2023-02-28 NOTE — ED Notes (Signed)
Pt again yelling making noise that is excessively loud on unit, staff again out to educate pt on volume. Pt not communicable with staff, stares and repeatedly scratches head. Does his yelling out while staff is present and it is like a yawn that is exaggerated

## 2023-03-01 DIAGNOSIS — F840 Autistic disorder: Secondary | ICD-10-CM | POA: Diagnosis not present

## 2023-03-01 DIAGNOSIS — F209 Schizophrenia, unspecified: Secondary | ICD-10-CM | POA: Diagnosis not present

## 2023-03-01 LAB — CBG MONITORING, ED
Glucose-Capillary: 176 mg/dL — ABNORMAL HIGH (ref 70–99)
Glucose-Capillary: 71 mg/dL (ref 70–99)
Glucose-Capillary: 206 mg/dL — ABNORMAL HIGH (ref 70–99)
Glucose-Capillary: 211 mg/dL — ABNORMAL HIGH (ref 70–99)

## 2023-03-01 MED ORDER — RISPERIDONE 1 MG PO TABS
1.0000 mg | ORAL_TABLET | Freq: Every day | ORAL | Status: DC
  Filled 2023-03-01: qty 1

## 2023-03-01 MED ORDER — RISPERIDONE 1 MG/ML PO SOLN
1.0000 mg | Freq: Every day | ORAL | Status: DC
  Administered 2023-03-01: 1 mg via ORAL
  Filled 2023-03-01: qty 1

## 2023-03-01 NOTE — ED Notes (Signed)
Pt currently refusing po meds, will attempt again at the 10'oclock med pass

## 2023-03-01 NOTE — ED Notes (Signed)
Patient refused snack. Patient given cup of water.

## 2023-03-01 NOTE — ED Notes (Signed)
Patient continues to refused HS medications.  This Clinical research associate tried to educate patient on importance of taking medications.  Patient states, "lady leave me alone."

## 2023-03-01 NOTE — ED Notes (Signed)
Educated pt multiple times about importance of taking scheduled meds and risks if he continues to refuse them. Pt continues to refuse meds.

## 2023-03-01 NOTE — ED Notes (Signed)
Psych team member completing secure tele-video chat with pt now.

## 2023-03-01 NOTE — ED Provider Notes (Signed)
Emergency Medicine Observation Re-evaluation Note  Coral Krammes is a 62 y.o. male, seen in the emergency department for agitation and psychiatric concerns.  No acute events since last update.  Physical Exam  BP (!) 150/94 (BP Location: Left Arm)   Pulse 79   Temp 98.5 F (36.9 C) (Oral)   Resp 18   Ht 4\' 2"  (1.27 m)   Wt 90 kg   SpO2 98%   BMI 55.80 kg/m   ED Course / MDM   Patient's lab work from approximately 1 week ago shows no significant findings reassuring CBC chemistry shows hyperglycemia with mild renal insufficiency urine drug screen was negative.  Patient's blood sugars have been much better regulated recently.  Plan  Current plan is for placement to a psychiatric facility for continued treatment.  Psychiatry is working with the patient to achieve this, remains under IVC.    Minna Antis, MD 03/01/23 1330

## 2023-03-01 NOTE — ED Notes (Signed)
Ivc /pending placement 

## 2023-03-01 NOTE — ED Notes (Signed)
Patient sitting in chair in small area.  Patient refuses to try and communicate with Clinical research associate. Will continue to monitor.   ENVIRONMENTAL ASSESSMENT Potentially harmful objects out of patient reach: Yes.   Personal belongings secured: Yes.   Patient dressed in hospital provided attire only: Yes.   Plastic bags out of patient reach: Yes.   Patient care equipment (cords, cables, call bells, lines, and drains) shortened, removed, or accounted for: Yes.   Equipment and supplies removed from bottom of stretcher: Yes.   Potentially toxic materials out of patient reach: Yes.   Sharps container removed or out of patient reach: Yes.

## 2023-03-01 NOTE — ED Notes (Signed)

## 2023-03-01 NOTE — TOC CM/SW Note (Signed)
Cm spoke with sister [name] [phone] about relationship with pt. Velvet stated that she has been taking care of pt since 1986. She does not want to take pt back at this time. Velvet recently had surgery and unable to care for him. Velvet stated she has guardianship paperwork but she cannot find it. Cm stated if she is able to find it to bring to hospital to scan. Pt is still awaiting placement into Psych facility. Cm wanted to update chart due to staff having a hard time reaching Velvet in the past.

## 2023-03-01 NOTE — BH Assessment (Signed)
Referral Checks:    Larry Cook [phone]),Unable to reach intake staff.     Larry Cook [phone]), Per Larry Cook access, No adult beds available bed.   [place] [phone]--- [phone]--- [phone]--- [phone]) Unable to reach intake staff.    [facility] [phone]), No answer   [name] [phone] -or- [phone]), Per Larry Cook, pt denied due to autism dx.   Larry Cook [phone]), Voicemail left.    [place] [phone]) Unable to reach intake staff.   Larry Cook [phone]). No answer/busy signal   [facility] [phone]) Unable to reach staff at this time.
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
## 2023-03-01 NOTE — ED Notes (Signed)
DINNER TRAY GIVEN. NO OTHER NEEDS FOUND AT THIS MOMENT.

## 2023-03-02 DIAGNOSIS — F209 Schizophrenia, unspecified: Secondary | ICD-10-CM | POA: Diagnosis not present

## 2023-03-02 DIAGNOSIS — F203 Undifferentiated schizophrenia: Secondary | ICD-10-CM | POA: Diagnosis not present

## 2023-03-02 LAB — CBG MONITORING, ED
Glucose-Capillary: 134 mg/dL — ABNORMAL HIGH (ref 70–99)
Glucose-Capillary: 163 mg/dL — ABNORMAL HIGH (ref 70–99)
Glucose-Capillary: 204 mg/dL — ABNORMAL HIGH (ref 70–99)
Glucose-Capillary: 209 mg/dL — ABNORMAL HIGH (ref 70–99)

## 2023-03-02 MED ORDER — OLANZAPINE 10 MG PO TABS
10.0000 mg | ORAL_TABLET | Freq: Two times a day (BID) | ORAL | Status: DC
  Administered 2023-03-02 – 2023-05-17 (×151): 10 mg via ORAL
  Filled 2023-03-02 (×154): qty 1

## 2023-03-02 MED ORDER — OLANZAPINE 10 MG IM SOLR
10.0000 mg | Freq: Two times a day (BID) | INTRAMUSCULAR | Status: DC
  Administered 2023-03-02 – 2023-03-03 (×2): 10 mg via INTRAMUSCULAR
  Filled 2023-03-02 (×2): qty 10

## 2023-03-02 NOTE — ED Notes (Signed)
Pt sitting in chair, loudly humming

## 2023-03-02 NOTE — ED Notes (Signed)
Pt refuses snack, shakes head no, water left at bedside for pt

## 2023-03-02 NOTE — ED Notes (Signed)
IVC/papers need to be renewed 7/13 by 7:30 pm

## 2023-03-02 NOTE — ED Provider Notes (Signed)
Emergency Medicine Observation Re-evaluation Note  Larry Cook is a 62 y.o. male, seen on rounds today.  Pt initially presented to the ED for complaints of Psychiatric Evaluation (IVC)   Physical Exam  BP (!) 155/94 (BP Location: Right Arm)   Pulse 84   Temp (!) 97.5 F (36.4 C) (Oral)   Resp 18   Ht 4\' 2"  (1.27 m)   Wt 90 kg   SpO2 98%   BMI 55.80 kg/m  Physical Exam General: nad  ED Course / MDM  EKG:EKG Interpretation Date/Time:  Tuesday February 26 2023 12:52:21 EDT Ventricular Rate:  88 PR Interval:  192 QRS Duration:  143 QT Interval:  416 QTC Calculation: 504 R Axis:   -67  Text Interpretation: Sinus rhythm RBBB and LAFB Left ventricular hypertrophy Confirmed by UNCONFIRMED, DOCTOR ([id number]), editor Lonell Face [phone]) on 02/26/2023 2:31:19 PM  I have reviewed the labs performed to date as well as medications administered while in observation.    Plan  Current plan is for psych/soc.    Willy Eddy, MD 03/02/23 [phone]

## 2023-03-02 NOTE — ED Notes (Signed)
Pt won't speak to staff, diet cleared from room, pt ate 75% of lunch and dinner. Follows requests but is not talking to staff

## 2023-03-02 NOTE — ED Notes (Signed)
Pt refused po meds and insulin after many attempts, only thing he stated to this nurse was "you better get that medicine out of here."  Pt educated on orders and plan should he continue to refuse. Security was contacted and arrived to admin IM since PO was refused. Pt did not require hold, took IM willingly and allowed insulin be given as well.
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
## 2023-03-03 DIAGNOSIS — F203 Undifferentiated schizophrenia: Secondary | ICD-10-CM | POA: Diagnosis not present

## 2023-03-03 DIAGNOSIS — F209 Schizophrenia, unspecified: Secondary | ICD-10-CM | POA: Diagnosis not present

## 2023-03-03 LAB — CBG MONITORING, ED
Glucose-Capillary: 135 mg/dL — ABNORMAL HIGH (ref 70–99)
Glucose-Capillary: 71 mg/dL (ref 70–99)
Glucose-Capillary: 124 mg/dL — ABNORMAL HIGH (ref 70–99)
Glucose-Capillary: 59 mg/dL — ABNORMAL LOW (ref 70–99)
Glucose-Capillary: 102 mg/dL — ABNORMAL HIGH (ref 70–99)
Glucose-Capillary: 167 mg/dL — ABNORMAL HIGH (ref 70–99)

## 2023-03-03 NOTE — ED Notes (Signed)
Patient is vol pending inpatient psych admit 

## 2023-03-03 NOTE — ED Notes (Signed)
Patient is AAOx3.  Skin warm and dry.  Dinner tray given to patient.  Patient eating well.  Ice cream given to patient.  Continue to monitor.

## 2023-03-03 NOTE — ED Notes (Signed)
Pt to restroom and has returned to bed

## 2023-03-03 NOTE — Consult Note (Signed)
[facility] Face-to-Face Psychiatry Consult   Reason for Consult:  aggressive behaviors Referring Physician:  EDP Patient Identification: Larry Cook MRN:  [id number] Principal Diagnosis: Undifferentiated schizophrenia (HCC) Diagnosis:  Principal Problem:   Undifferentiated schizophrenia (HCC) Active Problems:   Aggressive behavior   Diabetes (HCC)   BMI 40.0-44.9, adult (HCC)   Intellectual disability   Total Time spent with patient: 30 minutes  Subjective:   Larry Cook is a 62 y.o. male patient admitted with aggressive behaviors.  HPI:  63 yo male with a history of IDD and schizophrenia who was living with his sister until he became aggressive prior to admission.  He is nonverbal on assessment with nursing reporting refusal of his medications.  Medications changed to IM options.  The client did put a thumbs up when asked how he was doing, only form of communication.  Nurse notes he will talk at times.  Past Psychiatric History: schizophrenia, IDD  Risk to Self:  none Risk to Others:  yes Prior Inpatient Therapy:  none Prior Outpatient Therapy:  yes  Past Medical History:  Past Medical History:  Diagnosis Date   Diabetes mellitus without complication (HCC)    Hypertension    Mentally disabled    Schizophrenia (HCC)    History reviewed. No pertinent surgical history. Family History: History reviewed. No pertinent family history. Family Psychiatric  History: unknown Social History:  Social History   Substance and Sexual Activity  Alcohol Use No     Social History   Substance and Sexual Activity  Drug Use No    Social History   Socioeconomic History   Marital status: Single    Spouse name: Not on file   Number of children: Not on file   Years of education: Not on file   Highest education level: Not on file  Occupational History   Not on file  Tobacco Use   Smoking status: Never   Smokeless tobacco: Never  Substance and Sexual Activity   Alcohol use: No    Drug use: No   Sexual activity: Not on file  Other Topics Concern   Not on file  Social History Narrative   Not on file   Social Determinants of Health   Financial Resource Strain: Not on file  Food Insecurity: Not on file  Transportation Needs: Not on file  Physical Activity: Not on file  Stress: Not on file  Social Connections: Not on file   Additional Social History:    Allergies:   Allergies  Allergen Reactions   Acetaminophen-Codeine     Other reaction(s): Unknown    Labs:  Results for orders placed or performed during the hospital encounter of 02/23/23 (from the past 48 hour(s))  CBG monitoring, ED     Status: Abnormal   Collection Time: 03/01/23  8:30 AM  Result Value Ref Range   Glucose-Capillary 176 (H) 70 - 99 mg/dL    Comment: Glucose reference range applies only to samples taken after fasting for at least 8 hours.  CBG monitoring, ED     Status: None   Collection Time: 03/01/23 11:49 AM  Result Value Ref Range   Glucose-Capillary 71 70 - 99 mg/dL    Comment: Glucose reference range applies only to samples taken after fasting for at least 8 hours.  CBG monitoring, ED     Status: Abnormal   Collection Time: 03/01/23  4:18 PM  Result Value Ref Range   Glucose-Capillary 206 (H) 70 - 99 mg/dL    Comment: Glucose  reference range applies only to samples taken after fasting for at least 8 hours.   Comment 1 Notify RN    Comment 2 Document in Chart   CBG monitoring, ED     Status: Abnormal   Collection Time: 03/01/23  8:17 PM  Result Value Ref Range   Glucose-Capillary 211 (H) 70 - 99 mg/dL    Comment: Glucose reference range applies only to samples taken after fasting for at least 8 hours.  CBG monitoring, ED     Status: Abnormal   Collection Time: 03/02/23  7:42 AM  Result Value Ref Range   Glucose-Capillary 134 (H) 70 - 99 mg/dL    Comment: Glucose reference range applies only to samples taken after fasting for at least 8 hours.  CBG monitoring, ED      Status: Abnormal   Collection Time: 03/02/23 11:44 AM  Result Value Ref Range   Glucose-Capillary 163 (H) 70 - 99 mg/dL    Comment: Glucose reference range applies only to samples taken after fasting for at least 8 hours.  CBG monitoring, ED     Status: Abnormal   Collection Time: 03/02/23  4:24 PM  Result Value Ref Range   Glucose-Capillary 204 (H) 70 - 99 mg/dL    Comment: Glucose reference range applies only to samples taken after fasting for at least 8 hours.  CBG monitoring, ED     Status: Abnormal   Collection Time: 03/02/23  9:16 PM  Result Value Ref Range   Glucose-Capillary 209 (H) 70 - 99 mg/dL    Comment: Glucose reference range applies only to samples taken after fasting for at least 8 hours.    Current Facility-Administered Medications  Medication Dose Route Frequency Provider Last Rate Last Admin   insulin aspart (novoLOG) injection 0-20 Units  0-20 Units Subcutaneous TID WC Phineas Semen, MD   4 Units at 03/01/23 0839   insulin aspart (novoLOG) injection 0-5 Units  0-5 Units Subcutaneous QHS Phineas Semen, MD   2 Units at 03/02/23 2121   insulin aspart (novoLOG) injection 6 Units  6 Units Subcutaneous TID WC Corena Herter, MD   6 Units at 03/01/23 0839   insulin glargine-yfgn (SEMGLEE) injection 25 Units  25 Units Subcutaneous Q24H Corena Herter, MD   25 Units at 02/28/23 0950   LORazepam (ATIVAN) tablet 1 mg  1 mg Oral BID PRN Chales Abrahams, NP       metFORMIN (GLUCOPHAGE) tablet 1,000 mg  1,000 mg Oral BID WC Phineas Semen, MD   1,000 mg at 02/28/23 0949   OLANZapine (ZYPREXA) tablet 10 mg  10 mg Oral BID Charm Rings, NP   10 mg at 03/02/23 1158   Or   OLANZapine (ZYPREXA) injection 10 mg  10 mg Intramuscular BID Charm Rings, NP   10 mg at 03/02/23 2133   Current Outpatient Medications  Medication Sig Dispense Refill   paliperidone (INVEGA SUSTENNA) 156 MG/ML SUSY injection Inject 0.5 mLs (78 mg total) into the muscle every 28 (twenty-eight) days.  (Patient not taking: Reported on 10/29/2021) 1.2 mL 3   paliperidone (INVEGA) 3 MG 24 hr tablet Take 1 tablet (3 mg total) by mouth at bedtime. (Patient not taking: Reported on 02/24/2023) 30 tablet 1    Musculoskeletal: Strength & Muscle Tone: within normal limits Gait & Station: normal Patient leans: N/A  Psychiatric Specialty Exam: Physical Exam Vitals and nursing note reviewed.  Constitutional:      Appearance: Normal appearance.  HENT:  Head: Normocephalic.     Nose: Nose normal.  Pulmonary:     Effort: Pulmonary effort is normal.  Musculoskeletal:        General: Normal range of motion.     Cervical back: Normal range of motion.  Neurological:     General: No focal deficit present.     Mental Status: He is alert and oriented to person, place, and time.  Psychiatric:        Attention and Perception: He is inattentive.        Mood and Affect: Mood is anxious. Affect is blunt.        Speech: He is noncommunicative.        Behavior: Behavior is uncooperative.        Cognition and Memory: Cognition is impaired.        Judgment: Judgment is impulsive and inappropriate.     Review of Systems  Psychiatric/Behavioral:  The patient is nervous/anxious.   All other systems reviewed and are negative.   Blood pressure (!) 148/90, pulse 84, temperature 97.9 F (36.6 C), temperature source Oral, resp. rate 18, height 4\' 2"  (1.27 m), weight 90 kg, SpO2 99%.Body mass index is 55.8 kg/m.  General Appearance: Casual  Eye Contact:  Poor  Speech:  Negative  Volume:  non verbal on assessment  Mood:  unable to assess, UTA, put thumbs up  Affect:  Blunt  Thought Process:  UTA  Orientation:  UTA  Thought Content:  UTA  Suicidal Thoughts:  no self-harm behaviors  Homicidal Thoughts:  UTA  Memory:  UTA  Judgement:  Poor  Insight:  UTA  Psychomotor Activity:  Normal  Concentration:  UTA  Recall:  UTA  Fund of Knowledge:  Poor  Language:  Negative  Akathisia:  UTA  Handed:  Right   AIMS (if indicated):     Assets:  Leisure Time Resilience  ADL's:  Impaired  Cognition:  Impaired,  Moderate  Sleep:        Physical Exam: Physical Exam Vitals and nursing note reviewed.  Constitutional:      Appearance: Normal appearance.  HENT:     Head: Normocephalic.     Nose: Nose normal.  Pulmonary:     Effort: Pulmonary effort is normal.  Musculoskeletal:        General: Normal range of motion.     Cervical back: Normal range of motion.  Neurological:     General: No focal deficit present.     Mental Status: He is alert and oriented to person, place, and time.  Psychiatric:        Attention and Perception: He is inattentive.        Mood and Affect: Mood is anxious. Affect is blunt.        Speech: He is noncommunicative.        Behavior: Behavior is uncooperative.        Cognition and Memory: Cognition is impaired.        Judgment: Judgment is impulsive and inappropriate.    Review of Systems  Psychiatric/Behavioral:  The patient is nervous/anxious.   All other systems reviewed and are negative.  Blood pressure (!) 148/90, pulse 84, temperature 97.9 F (36.6 C), temperature source Oral, resp. rate 18, height 4\' 2"  (1.27 m), weight 90 kg, SpO2 99%. Body mass index is 55.8 kg/m.  Treatment Plan Summary: Daily contact with patient to assess and evaluate symptoms and progress in treatment, Medication management, and Plan : Schizophrenia, undifferentiated: Discontinued Risperdal  1 mg daily Zyprexa 10 mg BID oral or IM started Once he is taking oral medications consistently without the IM option, Invega will be reconsidered to transition him to [place]  Disposition: Supportive therapy provided about ongoing stressors.  Nanine Means, NP 03/03/2023 6:49 AM

## 2023-03-03 NOTE — ED Notes (Signed)
Vol/pending psychiatric inpatient admission.Marland Kitchen

## 2023-03-03 NOTE — Consult Note (Signed)
[facility] Face-to-Face Psychiatry Consult   Reason for Consult:  aggressive behaviors Referring Physician:  EDP Patient Identification: Larry Cook MRN:  [id number] Principal Diagnosis: Undifferentiated schizophrenia (HCC) Diagnosis:  Principal Problem:   Undifferentiated schizophrenia (HCC) Active Problems:   Aggressive behavior   Diabetes (HCC)   BMI 40.0-44.9, adult (HCC)   Intellectual disability   Total Time spent with patient: 30 minutes  Subjective:   Larry Cook is a 62 y.o. male patient admitted with aggressive behaviors.  The client continues to want to refuse medications initially.  Last night, he did take the oral Zyprexa bu today required the IM injection.  He continued to be non-verbal on assessment.  No aggression demonstrated or issues noted.  7/13:  62 yo male with a history of IDD and schizophrenia who was living with his sister until he became aggressive prior to admission.  He is nonverbal on assessment with nursing reporting refusal of his medications.  Medications changed to IM options.  The client did put a thumbs up when asked how he was doing, only form of communication.  The nuurse notes he will talk at times.  Past Psychiatric History: schizophrenia, IDD  Risk to Self:  none Risk to Others:  yes Prior Inpatient Therapy:  none Prior Outpatient Therapy:  yes  Past Medical History:  Past Medical History:  Diagnosis Date   Diabetes mellitus without complication (HCC)    Hypertension    Mentally disabled    Schizophrenia (HCC)    History reviewed. No pertinent surgical history. Family History: History reviewed. No pertinent family history. Family Psychiatric  History: unknown Social History:  Social History   Substance and Sexual Activity  Alcohol Use No     Social History   Substance and Sexual Activity  Drug Use No    Social History   Socioeconomic History   Marital status: Single    Spouse name: Not on file   Number of children: Not on  file   Years of education: Not on file   Highest education level: Not on file  Occupational History   Not on file  Tobacco Use   Smoking status: Never   Smokeless tobacco: Never  Substance and Sexual Activity   Alcohol use: No   Drug use: No   Sexual activity: Not on file  Other Topics Concern   Not on file  Social History Narrative   Not on file   Social Determinants of Health   Financial Resource Strain: Not on file  Food Insecurity: Not on file  Transportation Needs: Not on file  Physical Activity: Not on file  Stress: Not on file  Social Connections: Not on file   Additional Social History:  lived with his sister    Allergies:   Allergies  Allergen Reactions   Acetaminophen-Codeine     Other reaction(s): Unknown    Labs:  Results for orders placed or performed during the hospital encounter of 02/23/23 (from the past 48 hour(s))  CBG monitoring, ED     Status: Abnormal   Collection Time: 03/01/23  4:18 PM  Result Value Ref Range   Glucose-Capillary 206 (H) 70 - 99 mg/dL    Comment: Glucose reference range applies only to samples taken after fasting for at least 8 hours.   Comment 1 Notify RN    Comment 2 Document in Chart   CBG monitoring, ED     Status: Abnormal   Collection Time: 03/01/23  8:17 PM  Result Value Ref Range  Glucose-Capillary 211 (H) 70 - 99 mg/dL    Comment: Glucose reference range applies only to samples taken after fasting for at least 8 hours.  CBG monitoring, ED     Status: Abnormal   Collection Time: 03/02/23  7:42 AM  Result Value Ref Range   Glucose-Capillary 134 (H) 70 - 99 mg/dL    Comment: Glucose reference range applies only to samples taken after fasting for at least 8 hours.  CBG monitoring, ED     Status: Abnormal   Collection Time: 03/02/23 11:44 AM  Result Value Ref Range   Glucose-Capillary 163 (H) 70 - 99 mg/dL    Comment: Glucose reference range applies only to samples taken after fasting for at least 8 hours.  CBG  monitoring, ED     Status: Abnormal   Collection Time: 03/02/23  4:24 PM  Result Value Ref Range   Glucose-Capillary 204 (H) 70 - 99 mg/dL    Comment: Glucose reference range applies only to samples taken after fasting for at least 8 hours.  CBG monitoring, ED     Status: Abnormal   Collection Time: 03/02/23  9:16 PM  Result Value Ref Range   Glucose-Capillary 209 (H) 70 - 99 mg/dL    Comment: Glucose reference range applies only to samples taken after fasting for at least 8 hours.  CBG monitoring, ED     Status: Abnormal   Collection Time: 03/03/23  8:36 AM  Result Value Ref Range   Glucose-Capillary 135 (H) 70 - 99 mg/dL    Comment: Glucose reference range applies only to samples taken after fasting for at least 8 hours.  CBG monitoring, ED     Status: None   Collection Time: 03/03/23 11:44 AM  Result Value Ref Range   Glucose-Capillary 71 70 - 99 mg/dL    Comment: Glucose reference range applies only to samples taken after fasting for at least 8 hours.  CBG monitoring, ED     Status: Abnormal   Collection Time: 03/03/23  1:09 PM  Result Value Ref Range   Glucose-Capillary 124 (H) 70 - 99 mg/dL    Comment: Glucose reference range applies only to samples taken after fasting for at least 8 hours.    Current Facility-Administered Medications  Medication Dose Route Frequency Provider Last Rate Last Admin   insulin aspart (novoLOG) injection 0-20 Units  0-20 Units Subcutaneous TID WC Phineas Semen, MD   3 Units at 03/03/23 0846   insulin aspart (novoLOG) injection 0-5 Units  0-5 Units Subcutaneous QHS Phineas Semen, MD   2 Units at 03/02/23 2121   insulin aspart (novoLOG) injection 6 Units  6 Units Subcutaneous TID WC Corena Herter, MD   6 Units at 03/03/23 1312   insulin glargine-yfgn (SEMGLEE) injection 25 Units  25 Units Subcutaneous Q24H Corena Herter, MD   25 Units at 03/03/23 1033   LORazepam (ATIVAN) tablet 1 mg  1 mg Oral BID PRN Chales Abrahams, NP       metFORMIN  (GLUCOPHAGE) tablet 1,000 mg  1,000 mg Oral BID WC Phineas Semen, MD   1,000 mg at 02/28/23 0949   OLANZapine (ZYPREXA) tablet 10 mg  10 mg Oral BID Charm Rings, NP   10 mg at 03/02/23 1158   Or   OLANZapine (ZYPREXA) injection 10 mg  10 mg Intramuscular BID Charm Rings, NP   10 mg at 03/03/23 1034   Current Outpatient Medications  Medication Sig Dispense Refill   paliperidone (INVEGA SUSTENNA)  156 MG/ML SUSY injection Inject 0.5 mLs (78 mg total) into the muscle every 28 (twenty-eight) days. (Patient not taking: Reported on 10/29/2021) 1.2 mL 3   paliperidone (INVEGA) 3 MG 24 hr tablet Take 1 tablet (3 mg total) by mouth at bedtime. (Patient not taking: Reported on 02/24/2023) 30 tablet 1    Musculoskeletal: Strength & Muscle Tone: within normal limits Gait & Station: normal Patient leans: N/A  Psychiatric Specialty Exam: Physical Exam Vitals and nursing note reviewed.  Constitutional:      Appearance: Normal appearance.  HENT:     Head: Normocephalic.     Nose: Nose normal.  Pulmonary:     Effort: Pulmonary effort is normal.  Musculoskeletal:        General: Normal range of motion.     Cervical back: Normal range of motion.  Neurological:     General: No focal deficit present.     Mental Status: He is alert and oriented to person, place, and time.  Psychiatric:        Attention and Perception: He is inattentive.        Mood and Affect: Mood is anxious. Affect is blunt.        Speech: He is noncommunicative.        Behavior: Behavior is uncooperative.        Cognition and Memory: Cognition is impaired.        Judgment: Judgment is impulsive and inappropriate.     Review of Systems  Psychiatric/Behavioral:  The patient is nervous/anxious.   All other systems reviewed and are negative.   Blood pressure (!) 124/95, pulse 77, temperature 98.7 F (37.1 C), temperature source Oral, resp. rate 20, height 4\' 2"  (1.27 m), weight 90 kg, SpO2 99%.Body mass index is 55.8  kg/m.  General Appearance: Casual  Eye Contact:  Fair  Speech:  Negative  Volume:  non verbal on assessment  Mood:  unable to assess, UTA  Affect:  Blunt  Thought Process:  UTA  Orientation:  UTA  Thought Content:  UTA  Suicidal Thoughts:  no self-harm behaviors  Homicidal Thoughts:  UTA  Memory:  UTA  Judgement:  Poor  Insight:  UTA  Psychomotor Activity:  Normal  Concentration:  UTA  Recall:  UTA  Fund of Knowledge:  Poor  Language:  Negative  Akathisia:  UTA  Handed:  Right  AIMS (if indicated):     Assets:  Leisure Time Resilience  ADL's:  Impaired  Cognition:  Impaired,  Moderate  Sleep:        Physical Exam: Physical Exam Vitals and nursing note reviewed.  Constitutional:      Appearance: Normal appearance.  HENT:     Head: Normocephalic.     Nose: Nose normal.  Pulmonary:     Effort: Pulmonary effort is normal.  Musculoskeletal:        General: Normal range of motion.     Cervical back: Normal range of motion.  Neurological:     General: No focal deficit present.     Mental Status: He is alert and oriented to person, place, and time.  Psychiatric:        Attention and Perception: He is inattentive.        Mood and Affect: Mood is anxious. Affect is blunt.        Speech: He is noncommunicative.        Behavior: Behavior is uncooperative.        Cognition and Memory: Cognition is  impaired.        Judgment: Judgment is impulsive and inappropriate.    Review of Systems  Psychiatric/Behavioral:  The patient is nervous/anxious.   All other systems reviewed and are negative.  Blood pressure (!) 124/95, pulse 77, temperature 98.7 F (37.1 C), temperature source Oral, resp. rate 20, height 4\' 2"  (1.27 m), weight 90 kg, SpO2 99%. Body mass index is 55.8 kg/m.  Treatment Plan Summary: Daily contact with patient to assess and evaluate symptoms and progress in treatment, Medication management, and Plan : Schizophrenia, undifferentiated: Zyprexa 10 mg BID  oral or IM  Once he is taking oral medications consistently without the IM option, Hinda Glatter will be reconsidered to transition him to [place]  Disposition: Supportive therapy provided about ongoing stressors.  Nanine Means, NP 03/03/2023 1:47 PM

## 2023-03-03 NOTE — ED Notes (Signed)
Pt to and exited restroom now. Pt ambulates from restroom with pants around ankles. Once pt gets in the bed he pulls pants up to waist and continues to lay down.

## 2023-03-03 NOTE — ED Provider Notes (Signed)
Emergency Medicine Observation Re-evaluation Note  Larry Cook is a 62 y.o. male, seen on rounds today.  Pt initially presented to the ED for complaints of Psychiatric Evaluation (IVC)   Physical Exam  BP (!) 124/95 (BP Location: Right Arm)   Pulse 77   Temp 98.7 F (37.1 C) (Oral)   Resp 20   Ht 4\' 2"  (1.27 m)   Wt 90 kg   SpO2 99%   BMI 55.80 kg/m  Physical Exam    ED Course / MDM  EKG:EKG Interpretation Date/Time:  Tuesday February 26 2023 12:52:21 EDT Ventricular Rate:  88 PR Interval:  192 QRS Duration:  143 QT Interval:  416 QTC Calculation: 504 R Axis:   -67  Text Interpretation: Sinus rhythm RBBB and LAFB Left ventricular hypertrophy Confirmed by UNCONFIRMED, DOCTOR ([id number]), editor Lonell Face [phone]) on 02/26/2023 2:31:19 PM  I have reviewed the labs performed to date as well as medications administered while in observation.     Plan  Current plan is for psyc/soc.    Willy Eddy, MD 03/03/23 1026

## 2023-03-03 NOTE — ED Notes (Signed)
Provided with snack

## 2023-03-03 NOTE — ED Notes (Signed)
Pt restless, up and down from bed, walking unit, to and from restroom

## 2023-03-04 DIAGNOSIS — F209 Schizophrenia, unspecified: Secondary | ICD-10-CM | POA: Diagnosis not present

## 2023-03-04 LAB — CBG MONITORING, ED
Glucose-Capillary: 117 mg/dL — ABNORMAL HIGH (ref 70–99)
Glucose-Capillary: 123 mg/dL — ABNORMAL HIGH (ref 70–99)
Glucose-Capillary: 68 mg/dL — ABNORMAL LOW (ref 70–99)
Glucose-Capillary: 111 mg/dL — ABNORMAL HIGH (ref 70–99)

## 2023-03-04 MED ORDER — INSULIN ASPART 100 UNIT/ML IJ SOLN
4.0000 [IU] | Freq: Three times a day (TID) | INTRAMUSCULAR | Status: DC
  Administered 2023-03-05 – 2023-05-08 (×151): 4 [IU] via SUBCUTANEOUS
  Filled 2023-03-04 (×152): qty 1

## 2023-03-04 NOTE — ED Notes (Signed)
Vol /pending placement 

## 2023-03-04 NOTE — ED Notes (Signed)
Fsbs 68

## 2023-03-04 NOTE — ED Provider Notes (Signed)
Emergency Medicine Observation Re-evaluation Note  Larry Cook is a 62 y.o. male, seen on rounds today.  Pt initially presented to the ED for complaints of Psychiatric Evaluation (IVC)  Currently, the patient is resting in bed. No reported issues overnight from nursing team.   Physical Exam  BP (!) 139/90 (BP Location: Right Arm)   Pulse 75   Temp 98.1 F (36.7 C) (Oral)   Resp 18   Ht 4\' 2"  (1.27 m)   Wt 90 kg   SpO2 100%   BMI 55.80 kg/m  Physical Exam General: Resting comfortably in bed  ED Course / MDM  EKG:EKG Interpretation Date/Time:  Tuesday February 26 2023 12:52:21 EDT Ventricular Rate:  88 PR Interval:  192 QRS Duration:  143 QT Interval:  416 QTC Calculation: 504 R Axis:   -67  Text Interpretation: Sinus rhythm RBBB and LAFB Left ventricular hypertrophy Confirmed by UNCONFIRMED, DOCTOR ([id number]), editor Lonell Face [phone]) on 02/26/2023 2:31:19 PM  I have reviewed the labs and medications over the past 24 hours. Novolog meal coverage decreased to 4 units TID with meals per recs of diabetes coordinator.   Plan  Current plan is for dispo per social work.    Trinna Post, MD 03/04/23 [phone]

## 2023-03-04 NOTE — ED Notes (Signed)
Pt ate sandwich on dinner tray.  Pt drank diet ginger ale.
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
## 2023-03-04 NOTE — Inpatient Diabetes Management (Signed)
Inpatient Diabetes Program Recommendations  AACE/ADA: New Consensus Statement on Inpatient Glycemic Control (2015)  Target Ranges:  Prepandial:   less than 140 mg/dL      Peak postprandial:   less than 180 mg/dL (1-2 hours)      Critically ill patients:  140 - 180 mg/dL    Latest Reference Range & Units 03/03/23 08:36 03/03/23 11:44 03/03/23 13:09 03/03/23 16:22 03/03/23 17:11 03/03/23 21:07  Glucose-Capillary 70 - 99 mg/dL 161 (H)  9 units Novolog  71    25 units Semglee @1033  124 (H)  6 units Novolog  59 (L) 102 (H) 167 (H)  (H): Data is abnormally high (L): Data is abnormally low  Latest Reference Range & Units 03/04/23 08:42  Glucose-Capillary 70 - 99 mg/dL 096 (H)  (H): Data is abnormally high   Diabetes history: DM2  Outpatient Diabetes meds: None listed on home med list; per PCP note on 08/02/22 was prescribed Lantus 50 units at bedtime and Metformin 1000 mg BID  Current orders: Lantus 25 units Q24H     Novolog 6 units TID with meals     Novolog Resistant Correction Scale/ SSI (0-20 units) TID AC + HS       Metformin 1000 mg BID     MD- Note pt has been refusing doses of both Semglee Insulin and Novolog insulin  Note Hypoglycemia yest at 4pm after receiving 6 units Novolog insulin at 1pm for Lunch  Please consider reducing the Novolog Meal Coverage to 4 units TID with meals    --Will follow patient during hospitalization--  Ambrose Finland RN, MSN, CDCES Diabetes Coordinator Inpatient Glycemic Control Team Team Pager: [phone] (8a-5p)

## 2023-03-04 NOTE — ED Notes (Signed)
Pt up again

## 2023-03-04 NOTE — ED Notes (Signed)
Meds given after messaging dr Modesto Charon about po metformin.  Pt encouraged to eat.  Fsbs was 68.  Pt alert, skin warm and dry,.   Dinner meal provided to pt.

## 2023-03-05 DIAGNOSIS — F209 Schizophrenia, unspecified: Secondary | ICD-10-CM | POA: Diagnosis not present

## 2023-03-05 DIAGNOSIS — F203 Undifferentiated schizophrenia: Secondary | ICD-10-CM | POA: Diagnosis not present

## 2023-03-05 LAB — CBG MONITORING, ED
Glucose-Capillary: 108 mg/dL — ABNORMAL HIGH (ref 70–99)
Glucose-Capillary: 160 mg/dL — ABNORMAL HIGH (ref 70–99)
Glucose-Capillary: 169 mg/dL — ABNORMAL HIGH (ref 70–99)
Glucose-Capillary: 89 mg/dL (ref 70–99)
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
## 2023-03-05 NOTE — ED Notes (Signed)
No issues during shift.  Patient took PO medication with no issues.  Patient given snack at bedtime.  Patient observed resting during night with eyes closed.   ENVIRONMENTAL ASSESSMENT Potentially harmful objects out of patient reach: Yes.   Personal belongings secured: Yes.   Patient dressed in hospital provided attire only: Yes.   Plastic bags out of patient reach: Yes.   Patient care equipment (cords, cables, call bells, lines, and drains) shortened, removed, or accounted for: Yes.   Equipment and supplies removed from bottom of stretcher: Yes.   Potentially toxic materials out of patient reach: Yes.   Sharps container removed or out of patient reach: Yes.

## 2023-03-05 NOTE — ED Provider Notes (Signed)
Emergency Medicine Observation Re-evaluation Note  Physical Exam   BP (!) 144/92 (BP Location: Left Arm)   Pulse 72   Temp 98 F (36.7 C) (Oral)   Resp 16   Ht 4\' 2"  (1.27 m)   Wt 90 kg   SpO2 100%   BMI 55.80 kg/m   Patient appears in no acute distress.  ED Course / MDM   No reported events during my shift at the time of this note.   Pt is awaiting dispo from SW   Pilar Jarvis MD    Pilar Jarvis, MD 03/05/23 [phone]

## 2023-03-05 NOTE — ED Notes (Signed)
Pt given dinner tray and beverage  

## 2023-03-05 NOTE — TOC Progression Note (Signed)
Transition of Care [facility]) - Progression Note    Patient Details  Name: Larry Cook MRN: [id number] Date of Birth: 1961-02-05  [facility]) CM/SW Contact  Kreg Shropshire, RN Phone Number: 03/05/2023, 12:12 PM  Clinical Narrative:     Cm called [facility] DSS Social Worker Nancy Fetter [phone] to get more information on guardianship process for pt. Awaiting to hear back       Expected Discharge Plan and Services                                               Social Determinants of Health (SDOH) Interventions SDOH Screenings   Alcohol Screen: Low Risk  (05/16/2021)  Tobacco Use: Low Risk  (02/23/2023)    Readmission Risk Interventions     No data to display

## 2023-03-05 NOTE — ED Notes (Addendum)
Pt given water, graham crackers and peanut butter for bedtime snack.

## 2023-03-06 DIAGNOSIS — F209 Schizophrenia, unspecified: Secondary | ICD-10-CM | POA: Diagnosis not present

## 2023-03-06 LAB — CBG MONITORING, ED
Glucose-Capillary: 92 mg/dL (ref 70–99)
Glucose-Capillary: 121 mg/dL — ABNORMAL HIGH (ref 70–99)
Glucose-Capillary: 123 mg/dL — ABNORMAL HIGH (ref 70–99)
Glucose-Capillary: 70 mg/dL (ref 70–99)
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
## 2023-03-06 NOTE — ED Notes (Signed)
Pt currently asleep at this time will do VS and snack later

## 2023-03-06 NOTE — NC FL2 (Signed)
[place] MEDICAID FL2 LEVEL OF CARE FORM     IDENTIFICATION  Patient Name: Larry Cook Birthdate: Aug 22, 1960 Sex: male Admission Date (Current Location): 02/23/2023  [place] and [place] Number:  Chiropodist and Address:  [facility], [address] [place], [place] [address]      Provider Number: [id number]  Attending Physician Name and Address:  Dr. Trinna Post  Relative Name and Phone Number:  Current Legal Guardian is [facility] [phone].    Current Level of Care: Hospital Recommended Level of Care: Family Care Home Prior Approval Number:    Date Approved/Denied:   PASRR Number:    Discharge Plan: Other (Comment) (Group Home)    Current Diagnoses: Patient Active Problem List   Diagnosis Date Noted   Aggressive behavior 02/23/2023   Intellectual disability 06/16/2021   GERD (gastroesophageal reflux disease) 05/17/2021   Hyperlipidemia 05/17/2021   Undifferentiated schizophrenia (HCC) 05/16/2021   Diabetes (HCC) 05/16/2021   Asthma 05/16/2021   Hypertension 05/16/2021   Dyslipidemia 05/16/2021   BMI 40.0-44.9, adult (HCC) 12/31/2019   CKD (chronic kidney disease), stage III (HCC) 06/23/2019   Diabetes mellitus type 2, uncomplicated (HCC) 06/23/2019    Orientation RESPIRATION BLADDER Height & Weight        Normal Continent Weight: 90 kg Height:  4\' 2"  (127 cm)  BEHAVIORAL SYMPTOMS/MOOD NEUROLOGICAL BOWEL NUTRITION STATUS  Other (Comment) (non verbal, IDD)   Continent Diet (Medium consistant carb)  AMBULATORY STATUS COMMUNICATION OF NEEDS Skin   Supervision Non-Verbally Normal                       Personal Care Assistance Level of Assistance              Functional Limitations Info  Speech (non verbal)          SPECIAL CARE FACTORS FREQUENCY                       Contractures Contractures Info: Not present    Additional Factors Info  Code Status, Allergies Code Status Info:  Full Allergies Info: Acetaminophen-codeine           Current Medications (03/06/2023):  This is the current hospital active medication list Current Facility-Administered Medications  Medication Dose Route Frequency Provider Last Rate Last Admin   insulin aspart (novoLOG) injection 0-20 Units  0-20 Units Subcutaneous TID WC Phineas Semen, MD   3 Units at 03/06/23 1159   insulin aspart (novoLOG) injection 0-5 Units  0-5 Units Subcutaneous QHS Phineas Semen, MD   2 Units at 03/02/23 2121   insulin aspart (novoLOG) injection 4 Units  4 Units Subcutaneous TID WC Trinna Post, MD   4 Units at 03/06/23 1200   insulin glargine-yfgn (SEMGLEE) injection 25 Units  25 Units Subcutaneous Q24H Corena Herter, MD   25 Units at 03/06/23 0954   LORazepam (ATIVAN) tablet 1 mg  1 mg Oral BID PRN Chales Abrahams, NP       metFORMIN (GLUCOPHAGE) tablet 1,000 mg  1,000 mg Oral BID WC Phineas Semen, MD   1,000 mg at 03/06/23 0953   OLANZapine (ZYPREXA) tablet 10 mg  10 mg Oral BID Charm Rings, NP   10 mg at 03/06/23 7425   Or   OLANZapine (ZYPREXA) injection 10 mg  10 mg Intramuscular BID Charm Rings, NP   10 mg at 03/03/23 1034   Current Outpatient Medications  Medication Sig Dispense Refill  paliperidone (INVEGA SUSTENNA) 156 MG/ML SUSY injection Inject 0.5 mLs (78 mg total) into the muscle every 28 (twenty-eight) days. (Patient not taking: Reported on 10/29/2021) 1.2 mL 3   paliperidone (INVEGA) 3 MG 24 hr tablet Take 1 tablet (3 mg total) by mouth at bedtime. (Patient not taking: Reported on 02/24/2023) 30 tablet 1     Discharge Medications: Please see discharge summary for a list of discharge medications.  Relevant Imaging Results:  Relevant Lab Results:   Additional Information SSN# [id number]  Kreg Shropshire, RN

## 2023-03-06 NOTE — ED Notes (Signed)
Pt ate small amount of dinner meal.

## 2023-03-06 NOTE — TOC Progression Note (Addendum)
Transition of Care [facility]) - Progression Note    Patient Details  Name: Larry Cook MRN: [id number] Date of Birth: Jun 29, 1961  [facility]) CM/SW Contact  Kreg Shropshire, RN Phone Number: 03/06/2023, 8:44 AM  Clinical Narrative:     Cm f/u with Nancy Fetter of [facility] DSS regarding pt guardianship case. She stated she will f/u with me today. Cm awaiting to hear back   1356- Cm received called from Nancy Fetter to state that pt is now being followed by Jolene Provost of [facility] DSS [phone].  Cm wanted to touch base with getting group home placement for pt. Cm awaiting to hear back.   1437- Received call from Jolene Provost in regards to placement. He stated that he needs FL2, Psych notes, and doctors notes to help with placement. He also asked for forms of payment. When cm spoke with sister Velvet, she stated that pt was getting a check. Cm will send secure email to [facility] to help with placement for pt.  Expected Discharge Plan and Services                                               Social Determinants of Health (SDOH) Interventions SDOH Screenings   Alcohol Screen: Low Risk  (05/16/2021)  Tobacco Use: Low Risk  (02/23/2023)    Readmission Risk Interventions     No data to display

## 2023-03-06 NOTE — ED Notes (Signed)
Fsbs 123

## 2023-03-06 NOTE — ED Notes (Signed)
Dinner tray provided

## 2023-03-06 NOTE — ED Notes (Signed)
Due to CBG being 70. Pt received ice cream and goldfish

## 2023-03-06 NOTE — ED Provider Notes (Signed)
Emergency Medicine Observation Re-evaluation Note  Larry Cook is a 62 y.o. male, seen on rounds today.  Pt initially presented to the ED for complaints of Psychiatric Evaluation (IVC)  Currently, the patient is resting comfortably.  Physical Exam  BP (!) 155/72   Pulse 90   Temp 97.6 F (36.4 C) (Oral)   Resp 18   Ht 4\' 2"  (1.27 m)   Wt 90 kg   SpO2 99%   BMI 55.80 kg/m  General: No acute distress Cardiac: Well-perfused extremities Lungs: No respiratory distress Psych: Appropriate mood and affect  ED Course / MDM  EKG:EKG Interpretation Date/Time:  Tuesday February 26 2023 12:52:21 EDT Ventricular Rate:  88 PR Interval:  192 QRS Duration:  143 QT Interval:  416 QTC Calculation: 504 R Axis:   -67  Text Interpretation: Sinus rhythm RBBB and LAFB Left ventricular hypertrophy Confirmed by UNCONFIRMED, DOCTOR ([id number]), editor Lonell Face [phone]) on 02/26/2023 2:31:19 PM  I have reviewed the labs performed to date as well as medications administered while in observation.  Recent changes in the last 24 hours include none.  Plan  Current plan is for placement.   Merwyn Katos, MD 03/06/23 [phone]

## 2023-03-07 DIAGNOSIS — F209 Schizophrenia, unspecified: Secondary | ICD-10-CM | POA: Diagnosis not present

## 2023-03-07 LAB — CBG MONITORING, ED
Glucose-Capillary: 108 mg/dL — ABNORMAL HIGH (ref 70–99)
Glucose-Capillary: 69 mg/dL — ABNORMAL LOW (ref 70–99)
Glucose-Capillary: 125 mg/dL — ABNORMAL HIGH (ref 70–99)
Glucose-Capillary: 174 mg/dL — ABNORMAL HIGH (ref 70–99)

## 2023-03-07 NOTE — ED Notes (Signed)
Advised pt to clean up his trash from snack, cups and crackers package pt shook his head no.

## 2023-03-07 NOTE — ED Notes (Signed)
ENVIRONMENTAL ASSESSMENT Potentially harmful objects out of patient reach: Yes.   Personal belongings secured: Yes.   Patient dressed in hospital provided attire only: Yes.   Plastic bags out of patient reach: Yes.   Patient care equipment (cords, cables, call bells, lines, and drains) shortened, removed, or accounted for: Yes.   Equipment and supplies removed from bottom of stretcher: Yes.   Potentially toxic materials out of patient reach: Yes.   Sharps container removed or out of patient reach: Yes.    

## 2023-03-07 NOTE — ED Notes (Signed)
Pt given dinner tray.

## 2023-03-07 NOTE — ED Notes (Signed)
Pt keeps going back and forth to the bathroom, each time I do a check he is standing at the sink.

## 2023-03-07 NOTE — Inpatient Diabetes Management (Signed)
Inpatient Diabetes Program Recommendations  AACE/ADA: New Consensus Statement on Inpatient Glycemic Control (2015)  Target Ranges:  Prepandial:   less than 140 mg/dL      Peak postprandial:   less than 180 mg/dL (1-2 hours)      Critically ill patients:  140 - 180 mg/dL   Lab Results  Component Value Date   GLUCAP 69 (L) 03/07/2023   HGBA1C 10.9 (H) 05/16/2021    Review of Glycemic Control  Latest Reference Range & Units 03/06/23 21:15 03/07/23 08:03 03/07/23 11:53  Glucose-Capillary 70 - 99 mg/dL 70 132 (H) 69 (L)  (H): Data is abnormally high (L): Data is abnormally low  Diabetes history: DM2 Outpatient Diabetes medications: None listed on home med list; per PCP note on 08/02/22 was prescribed Lantus 50 units at bedtime and Metformin 1000 mg BID  Current orders for Inpatient glycemic control: Semglee 25 units every day, Novolog 4 units TID with meals, Novolog 0-20 units TID & HS, Metformin 1000 mg BID  Inpatient Diabetes Program Recommendations:    Might consider decreasing Novolog meal coverage to 2 units TID with meals if he eats at least 50%.  Will continue to follow while inpatient.  Thank you, Dulce Sellar, MSN, CDCES Diabetes Coordinator Inpatient Diabetes Program [phone] (team pager from 8a-5p)

## 2023-03-07 NOTE — ED Notes (Signed)
Pt is resting appears asleep will assess vitals if pt wakes up.

## 2023-03-07 NOTE — ED Notes (Signed)
ENVIRONMENTAL ASSESSMENT Potentially harmful objects out of patient reach: Yes.   Personal belongings secured: Yes.   Patient dressed in hospital provided attire only: Yes.   Plastic bags out of patient reach: Yes.   Patient care equipment (cords, cables, call bells, lines, and drains) shortened, removed, or accounted for: Yes.   Equipment and supplies removed from bottom of stretcher: Yes.   Potentially toxic materials out of patient reach: Yes.   Sharps container removed or out of patient reach: Yes.    Pt. Alert, warm and dry, in no distress.  No behavioral concerns during the shift. Pt. Encouraged to let nursing staff know of any concerns or needs.

## 2023-03-08 DIAGNOSIS — F209 Schizophrenia, unspecified: Secondary | ICD-10-CM | POA: Diagnosis not present

## 2023-03-08 LAB — CBG MONITORING, ED
Glucose-Capillary: 97 mg/dL (ref 70–99)
Glucose-Capillary: 113 mg/dL — ABNORMAL HIGH (ref 70–99)
Glucose-Capillary: 69 mg/dL — ABNORMAL LOW (ref 70–99)
Glucose-Capillary: 161 mg/dL — ABNORMAL HIGH (ref 70–99)
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
## 2023-03-08 NOTE — ED Provider Notes (Signed)
Emergency Medicine Observation Re-evaluation Note  Larry Cook is a 62 y.o. male, seen on rounds today.  Pt initially presented to the ED for complaints of Psychiatric Evaluation (IVC)  Currently, the patient is calm, no acute complaints.  Physical Exam  Blood pressure 121/86, pulse 88, temperature 98.7 F (37.1 C), temperature source Oral, resp. rate 16, height 4\' 2"  (1.27 m), weight 90 kg, SpO2 99%. Physical Exam General: NAD Lungs: CTAB Psych: not agitated  ED Course / MDM  EKG:    I have reviewed the labs performed to date as well as medications administered while in observation.  Recent changes in the last 24 hours include no acute events overnight.    Plan  Current plan is for TOC placement.   Sharman Cheek, MD 03/08/23 [phone]

## 2023-03-08 NOTE — TOC Progression Note (Addendum)
Transition of Care [facility]) - Progression Note    Patient Details  Name: Larry Cook MRN: [id number] Date of Birth: [birth year]  Transition of Care [facility]) CM/SW Contact  Larry Shropshire, RN Phone Number: 03/08/2023, 1:36 PM  Clinical Narrative:    Cm called listed Legal Guardian Larry Cook [phone] to ask permission on looking for group homes in the area. Cm left voicemail for her to call back.   Cm also sent secure email to [facility] to assist in placement.  1611-Received email from Larry Cook stating he receives his funding from the following:   [facility] ([facility]) [place], [place], [place] [facility] Child  Francesca Jewett Phone:  [phone] Email:  [email or web address]  Cm called Larry Cook but voicemail was full. Cm sent email to care management team for help finding placement.       Expected Discharge Plan and Services                                               Social Determinants of Health (SDOH) Interventions SDOH Screenings   Alcohol Screen: Low Risk  (05/16/2021)  Tobacco Use: Low Risk  (02/23/2023)    Readmission Risk Interventions     No data to display

## 2023-03-08 NOTE — ED Notes (Signed)
This tech and EDT [facility] obtained vital signs on pt.

## 2023-03-08 NOTE — ED Notes (Signed)
Pt sleeping, will obtain vital signs when pt wakes up.

## 2023-03-09 DIAGNOSIS — F209 Schizophrenia, unspecified: Secondary | ICD-10-CM | POA: Diagnosis not present

## 2023-03-09 LAB — CBG MONITORING, ED
Glucose-Capillary: 116 mg/dL — ABNORMAL HIGH (ref 70–99)
Glucose-Capillary: 116 mg/dL — ABNORMAL HIGH (ref 70–99)
Glucose-Capillary: 137 mg/dL — ABNORMAL HIGH (ref 70–99)
Glucose-Capillary: 96 mg/dL (ref 70–99)

## 2023-03-09 MED ORDER — HALOPERIDOL 5 MG PO TABS
5.0000 mg | ORAL_TABLET | Freq: Two times a day (BID) | ORAL | Status: DC
  Administered 2023-03-09: 5 mg via ORAL
  Filled 2023-03-09: qty 1

## 2023-03-09 MED ORDER — HALOPERIDOL 5 MG PO TABS
5.0000 mg | ORAL_TABLET | Freq: Two times a day (BID) | ORAL | Status: DC | PRN
  Administered 2023-04-28: 5 mg via ORAL
  Filled 2023-03-09: qty 1

## 2023-03-09 NOTE — ED Notes (Signed)
Vital signs and CBG completed on patient.

## 2023-03-09 NOTE — TOC Progression Note (Addendum)
Transition of Care [facility]) - Progression Note    Patient Details  Name: Larry Cook MRN: [id number] Date of Birth: 03/24/1961  Transition of Care [facility]) CM/SW Contact  Liliana Cline, LCSW Phone Number: 03/09/2023, 9:57 AM  Clinical Narrative:    Attempted call to Darl Pikes with [facility] [phone]) to follow up for assistance with placement for this patient. VM full.         Expected Discharge Plan and Services                                               Social Determinants of Health (SDOH) Interventions SDOH Screenings   Alcohol Screen: Low Risk  (05/16/2021)  Tobacco Use: Low Risk  (02/23/2023)    Readmission Risk Interventions     No data to display

## 2023-03-09 NOTE — ED Provider Notes (Signed)
Emergency Medicine Observation Re-evaluation Note  Physical Exam   BP (!) 148/88 (BP Location: Left Arm)   Pulse 87   Temp 98.1 F (36.7 C) (Oral)   Resp 17   Ht 4\' 2"  (1.27 m)   Wt 90 kg   SpO2 99%   BMI 55.80 kg/m   Patient appears in no acute distress.  ED Course / MDM   No reported events during my shift at the time of this note.   Pt is awaiting dispo from SW   Pilar Jarvis MD    Pilar Jarvis, MD 03/09/23 0730
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
## 2023-03-09 NOTE — ED Notes (Signed)
Pt given dinner tray and beverage  

## 2023-03-09 NOTE — ED Notes (Signed)
Patient is vol pending TOC placement 

## 2023-03-10 DIAGNOSIS — F209 Schizophrenia, unspecified: Secondary | ICD-10-CM | POA: Diagnosis not present

## 2023-03-10 LAB — CBG MONITORING, ED
Glucose-Capillary: 79 mg/dL (ref 70–99)
Glucose-Capillary: 122 mg/dL — ABNORMAL HIGH (ref 70–99)
Glucose-Capillary: 94 mg/dL (ref 70–99)
Glucose-Capillary: 89 mg/dL (ref 70–99)

## 2023-03-10 NOTE — ED Notes (Signed)
Pt did not want pm snack.

## 2023-03-10 NOTE — TOC CM/SW Note (Signed)
CSW left a Engineer, technical sales for Larry Cook at [employer].  Layla Barter, LCSWA TOC-Weekends [phone]

## 2023-03-10 NOTE — ED Notes (Signed)
Dinner tray and beverage given to pt  

## 2023-03-10 NOTE — ED Notes (Signed)
VOL/ TOC 

## 2023-03-10 NOTE — ED Notes (Signed)
Pt given lunch tray.

## 2023-03-10 NOTE — ED Notes (Signed)
This NT woke pt to ask to take vitals. Pt shook his head and laid back down. Pt ignored this NT when he was asked again, so vitals were not gotten from this pt.

## 2023-03-10 NOTE — ED Provider Notes (Signed)
Emergency Medicine Observation Re-evaluation Note  Larry Cook is a 62 y.o. male, seen on rounds today.  Pt initially presented to the ED for complaints of Psychiatric Evaluation (IVC) Currently, the patient is resting comfortably.  Physical Exam  BP 124/68 (BP Location: Left Arm)   Pulse 90   Temp 98 F (36.7 C) (Oral)   Resp 17   Ht 4\' 2"  (1.27 m)   Wt 90 kg   SpO2 100%   BMI 55.80 kg/m  Physical Exam Patient appears well, no acute distress, normal WOB    ED Course / MDM  EKG:EKG Interpretation Date/Time:  Tuesday February 26 2023 12:52:21 EDT Ventricular Rate:  88 PR Interval:  192 QRS Duration:  143 QT Interval:  416 QTC Calculation: 504 R Axis:   -67  Text Interpretation: Sinus rhythm RBBB and LAFB Left ventricular hypertrophy Confirmed by UNCONFIRMED, DOCTOR ([id number]), editor Lonell Face [phone]) on 02/26/2023 2:31:19 PM  I have reviewed the labs performed to date as well as medications administered while in observation.  Recent changes in the last 24 hours include none.  Plan  Current plan is for dispo per social work.    Chesley Noon, MD 03/10/23 [phone]

## 2023-03-10 NOTE — ED Notes (Addendum)
Pt given breakfast tray with orange juice.

## 2023-03-10 NOTE — ED Notes (Signed)
Patient to ED BHU rm 3.  This RN assumed care of patient.

## 2023-03-11 DIAGNOSIS — F209 Schizophrenia, unspecified: Secondary | ICD-10-CM | POA: Diagnosis not present

## 2023-03-11 LAB — CBG MONITORING, ED
Glucose-Capillary: 49 mg/dL — ABNORMAL LOW (ref 70–99)
Glucose-Capillary: 141 mg/dL — ABNORMAL HIGH (ref 70–99)
Glucose-Capillary: 67 mg/dL — ABNORMAL LOW (ref 70–99)
Glucose-Capillary: 118 mg/dL — ABNORMAL HIGH (ref 70–99)

## 2023-03-11 MED ORDER — INSULIN ASPART 100 UNIT/ML IJ SOLN
0.0000 [IU] | Freq: Three times a day (TID) | INTRAMUSCULAR | Status: DC
  Administered 2023-03-12: 1 [IU] via SUBCUTANEOUS
  Administered 2023-03-12: 2 [IU] via SUBCUTANEOUS
  Administered 2023-03-13 – 2023-03-14 (×2): 1 [IU] via SUBCUTANEOUS
  Administered 2023-03-14: 3 [IU] via SUBCUTANEOUS
  Administered 2023-03-15 – 2023-03-16 (×3): 1 [IU] via SUBCUTANEOUS
  Administered 2023-03-16 – 2023-03-17 (×2): 2 [IU] via SUBCUTANEOUS
  Administered 2023-03-17: 3 [IU] via SUBCUTANEOUS
  Administered 2023-03-17: 2 [IU] via SUBCUTANEOUS
  Administered 2023-03-18: 7 [IU] via SUBCUTANEOUS
  Administered 2023-03-19: 1 [IU] via SUBCUTANEOUS
  Administered 2023-03-19 (×2): 2 [IU] via SUBCUTANEOUS
  Administered 2023-03-20: 1 [IU] via SUBCUTANEOUS
  Administered 2023-03-20: 2 [IU] via SUBCUTANEOUS
  Administered 2023-03-20: 1 [IU] via SUBCUTANEOUS
  Administered 2023-03-21: 2 [IU] via SUBCUTANEOUS
  Administered 2023-03-21: 5 [IU] via SUBCUTANEOUS
  Administered 2023-03-21: 1 [IU] via SUBCUTANEOUS
  Administered 2023-03-22: 3 [IU] via SUBCUTANEOUS
  Administered 2023-03-22: 2 [IU] via SUBCUTANEOUS
  Administered 2023-03-23: 1 [IU] via SUBCUTANEOUS
  Administered 2023-03-23 – 2023-03-25 (×6): 2 [IU] via SUBCUTANEOUS
  Administered 2023-03-25: 1 [IU] via SUBCUTANEOUS
  Administered 2023-03-25: 2 [IU] via SUBCUTANEOUS
  Administered 2023-03-26: 3 [IU] via SUBCUTANEOUS
  Administered 2023-03-26 (×2): 2 [IU] via SUBCUTANEOUS
  Administered 2023-03-27: 5 [IU] via SUBCUTANEOUS
  Administered 2023-03-27: 3 [IU] via SUBCUTANEOUS
  Administered 2023-03-27: 2 [IU] via SUBCUTANEOUS
  Administered 2023-03-28: 3 [IU] via SUBCUTANEOUS
  Administered 2023-03-28: 2 [IU] via SUBCUTANEOUS
  Administered 2023-03-28 – 2023-03-29 (×2): 1 [IU] via SUBCUTANEOUS
  Administered 2023-03-29 – 2023-03-31 (×6): 2 [IU] via SUBCUTANEOUS
  Administered 2023-03-31: 1 [IU] via SUBCUTANEOUS
  Administered 2023-04-02 – 2023-04-03 (×3): 2 [IU] via SUBCUTANEOUS
  Administered 2023-04-05 – 2023-04-07 (×3): 1 [IU] via SUBCUTANEOUS
  Administered 2023-04-08 (×2): 2 [IU] via SUBCUTANEOUS
  Administered 2023-04-09: 1 [IU] via SUBCUTANEOUS
  Administered 2023-04-09: 2 [IU] via SUBCUTANEOUS
  Administered 2023-04-10 (×2): 1 [IU] via SUBCUTANEOUS
  Administered 2023-04-11 – 2023-04-14 (×3): 2 [IU] via SUBCUTANEOUS
  Administered 2023-04-15 – 2023-04-16 (×2): 1 [IU] via SUBCUTANEOUS
  Administered 2023-04-16: 2 [IU] via SUBCUTANEOUS
  Administered 2023-04-16: 1 [IU] via SUBCUTANEOUS
  Administered 2023-04-18 (×2): 2 [IU] via SUBCUTANEOUS
  Administered 2023-04-19: 1 [IU] via SUBCUTANEOUS
  Administered 2023-04-19: 2 [IU] via SUBCUTANEOUS
  Administered 2023-04-20: 1 [IU] via SUBCUTANEOUS
  Administered 2023-04-20 – 2023-04-21 (×2): 2 [IU] via SUBCUTANEOUS
  Administered 2023-04-21: 1 [IU] via SUBCUTANEOUS
  Administered 2023-04-22: 3 [IU] via SUBCUTANEOUS
  Administered 2023-04-23: 2 [IU] via SUBCUTANEOUS
  Administered 2023-04-23: 1 [IU] via SUBCUTANEOUS
  Administered 2023-04-24: 2 [IU] via SUBCUTANEOUS
  Administered 2023-04-24 – 2023-04-25 (×2): 1 [IU] via SUBCUTANEOUS
  Administered 2023-04-25: 7 [IU] via SUBCUTANEOUS
  Administered 2023-04-26 – 2023-04-27 (×2): 1 [IU] via SUBCUTANEOUS
  Administered 2023-04-27: 3 [IU] via SUBCUTANEOUS
  Administered 2023-04-27 – 2023-04-28 (×2): 1 [IU] via SUBCUTANEOUS
  Administered 2023-04-29 – 2023-05-01 (×3): 2 [IU] via SUBCUTANEOUS
  Administered 2023-05-02: 1 [IU] via SUBCUTANEOUS
  Administered 2023-05-02: 3 [IU] via SUBCUTANEOUS
  Administered 2023-05-06 – 2023-05-09 (×2): 2 [IU] via SUBCUTANEOUS
  Administered 2023-05-10: 1 [IU] via SUBCUTANEOUS
  Administered 2023-05-11: 2 [IU] via SUBCUTANEOUS
  Administered 2023-05-13 – 2023-05-16 (×4): 1 [IU] via SUBCUTANEOUS
  Administered 2023-05-16 – 2023-05-17 (×2): 2 [IU] via SUBCUTANEOUS
  Filled 2023-03-11 (×37): qty 1

## 2023-03-11 MED ORDER — INSULIN GLARGINE-YFGN 100 UNIT/ML ~~LOC~~ SOLN
15.0000 [IU] | SUBCUTANEOUS | Status: DC
  Administered 2023-03-12 – 2023-05-08 (×58): 15 [IU] via SUBCUTANEOUS
  Filled 2023-03-11 (×61): qty 0.15

## 2023-03-11 NOTE — ED Notes (Signed)
Pt given PM snack at this time.  

## 2023-03-11 NOTE — ED Notes (Addendum)
Patient's blood sugar low this am, but Patient is asymptomatic, and He ate big breakfast with juice , will recheck blood sugar in 30 minutes. Patient is pleasant and cooperative. Nurse held His sliding scale and scheduled fast acting insulin.

## 2023-03-11 NOTE — Inpatient Diabetes Management (Signed)
Inpatient Diabetes Program Recommendations  AACE/ADA: New Consensus Statement on Inpatient Glycemic Control (2015)  Target Ranges:  Prepandial:   less than 140 mg/dL      Peak postprandial:   less than 180 mg/dL (1-2 hours)      Critically ill patients:  140 - 180 mg/dL   Lab Results  Component Value Date   GLUCAP 141 (H) 03/11/2023   HGBA1C 10.9 (H) 05/16/2021    Review of Glycemic Control  Latest Reference Range & Units 03/10/23 08:18 03/10/23 11:50 03/10/23 17:40 03/10/23 21:12 03/11/23 08:57 03/11/23 11:49  Glucose-Capillary 70 - 99 mg/dL 79 295 (H) 94 89 49 (L) 141 (H)   Diabetes history: DM 2 Outpatient Diabetes medications:  per PCP note on 08/02/22 was prescribed Lantus 50 units at bedtime and Metformin 1000 mg BID however it does not appear that patient was taking Current orders for Inpatient glycemic control:  Semglee 25 units daily Novolog 0-20 units tid with meals and HS Novolog 4 units tid with meals   Inpatient Diabetes Program Recommendations:    Please consider reducing Semglee to 15 units daily and reduce Novolog correction to sensitive (patient only received one dose of Novolog on 03/10/23).    Thanks,  Beryl Meager, RN, BC-ADM Inpatient Diabetes Coordinator Pager [phone]  (8a-5p)

## 2023-03-11 NOTE — ED Notes (Signed)
Patient's blood sugar rechecked and it was 92

## 2023-03-11 NOTE — TOC Progression Note (Signed)
Transition of Care [facility]) - Progression Note    Patient Details  Name: Larry Cook MRN: [id number] Date of Birth: [birth year]  [facility] CM/SW Contact  Kreg Shropshire, RN Phone Number: 03/11/2023, 11:01 AM  Clinical Narrative:     Cm received call from Iline Oven [phone] [email or web address] from [facility] stating that she has to call Legal Guardian Velvet to do an assessment for pt to start looking for placement. She will also be in communication with [facility]. Cm explained that legal guardian is looking to giving up her guardianship. However, she is still the legal guardian. Cm asked if she can call me back once she speaks with the legal guardian about assessment.        Expected Discharge Plan and Services                                               Social Determinants of Health (SDOH) Interventions SDOH Screenings   Alcohol Screen: Low Risk  (05/16/2021)  Tobacco Use: Low Risk  (02/23/2023)    Readmission Risk Interventions     No data to display

## 2023-03-11 NOTE — ED Provider Notes (Signed)
Emergency Medicine Observation Re-evaluation Note  Physical Exam   BP (!) 154/97 (BP Location: Right Arm)   Pulse 80   Temp 97.8 F (36.6 C) (Oral)   Resp 17   Ht 4\' 2"  (1.27 m)   Wt 90 kg   SpO2 96%   BMI 55.80 kg/m   Patient appears in no acute distress.  ED Course / MDM   No reported events during my shift at the time of this note.   Pt is awaiting dispo from SW   Pilar Jarvis MD    Pilar Jarvis, MD 03/11/23 [phone]

## 2023-03-11 NOTE — ED Notes (Signed)
Pt up to restroom.

## 2023-03-11 NOTE — ED Notes (Signed)
Hospital meal provided, pt tolerated w/o complaints.  Waste discarded appropriately.  

## 2023-03-12 DIAGNOSIS — F209 Schizophrenia, unspecified: Secondary | ICD-10-CM | POA: Diagnosis not present

## 2023-03-12 LAB — CBG MONITORING, ED
Glucose-Capillary: 136 mg/dL — ABNORMAL HIGH (ref 70–99)
Glucose-Capillary: 171 mg/dL — ABNORMAL HIGH (ref 70–99)
Glucose-Capillary: 52 mg/dL — ABNORMAL LOW (ref 70–99)
Glucose-Capillary: 208 mg/dL — ABNORMAL HIGH (ref 70–99)

## 2023-03-12 NOTE — TOC Progression Note (Addendum)
Transition of Care [facility]) - Progression Note    Patient Details  Name: Larry Cook MRN: [id number] Date of Birth: 29-Apr-1961  Transition of Care [facility]) CM/SW Contact  Kreg Shropshire, RN Phone Number: 03/12/2023, 1:07 PM  Clinical Narrative:    Cm LVM Iline Oven [phone] to receive updates on pt for placement options. Cm awaiting callback  [phone]- Tracey called cm back and stated that she has an assessment with pt Legal Guardian for 2pm tomorrow. They then can start referral process.      Expected Discharge Plan and Services                                               Social Determinants of Health (SDOH) Interventions SDOH Screenings   Alcohol Screen: Low Risk  (05/16/2021)  Tobacco Use: Low Risk  (02/23/2023)    Readmission Risk Interventions     No data to display

## 2023-03-12 NOTE — ED Notes (Signed)
Up to restroom.

## 2023-03-12 NOTE — ED Provider Notes (Signed)
Emergency Medicine Observation Re-evaluation Note  Khy Pitre is a 62 y.o. male, seen on rounds today.  Pt initially presented to the ED for complaints of Psychiatric Evaluation (IVC)  Currently, the patient is resting comfortably.  Physical Exam  BP 93/67 (BP Location: Left Arm)   Pulse 86   Temp 98.3 F (36.8 C) (Oral)   Resp 16   Ht 4\' 2"  (1.27 m)   Wt 90 kg   SpO2 96%   BMI 55.80 kg/m  General: No acute distress Cardiac: Well-perfused extremities Lungs: No respiratory distress Psych: Appropriate mood and affect  ED Course / MDM  EKG:  I have reviewed the labs performed to date as well as medications administered while in observation.  Recent changes in the last 24 hours include none.  Plan  Current plan is for placement.   Merwyn Katos, MD 03/12/23 [phone]

## 2023-03-12 NOTE — ED Notes (Signed)
Pt asleep; will obtain VS and offer snack when pt wakes up.

## 2023-03-13 DIAGNOSIS — F209 Schizophrenia, unspecified: Secondary | ICD-10-CM | POA: Diagnosis not present

## 2023-03-13 LAB — CBG MONITORING, ED
Glucose-Capillary: 103 mg/dL — ABNORMAL HIGH (ref 70–99)
Glucose-Capillary: 84 mg/dL (ref 70–99)
Glucose-Capillary: 124 mg/dL — ABNORMAL HIGH (ref 70–99)
Glucose-Capillary: 99 mg/dL (ref 70–99)

## 2023-03-13 NOTE — ED Notes (Signed)
Snack and drink given 

## 2023-03-13 NOTE — ED Notes (Signed)
No issues during shift.    ENVIRONMENTAL ASSESSMENT Potentially harmful objects out of patient reach: Yes.   Personal belongings secured: Yes.   Patient dressed in hospital provided attire only: Yes.   Plastic bags out of patient reach: Yes.   Patient care equipment (cords, cables, call bells, lines, and drains) shortened, removed, or accounted for: Yes.   Equipment and supplies removed from bottom of stretcher: Yes.   Potentially toxic materials out of patient reach: Yes.   Sharps container removed or out of patient reach: Yes.

## 2023-03-13 NOTE — ED Notes (Signed)
Patient's blood sugar obtained, He is pleasant, no behavioral issues, staff will continue to monitor for safety.

## 2023-03-13 NOTE — ED Notes (Signed)
Patient received breakfast and beverage,.

## 2023-03-13 NOTE — ED Notes (Signed)
Pt given dinner tray.

## 2023-03-13 NOTE — ED Notes (Signed)
vol/toc.... 

## 2023-03-13 NOTE — ED Notes (Signed)
Pt provided with ginger ale with meds

## 2023-03-13 NOTE — ED Provider Notes (Signed)
Emergency Medicine Observation Re-evaluation Note  Larry Cook is a 62 y.o. male, seen on rounds today.  Pt initially presented to the ED for complaints of Psychiatric Evaluation (IVC)  Currently, the patient is resting comfortably.  Physical Exam  BP (!) 151/82 (BP Location: Right Arm)   Pulse 92   Temp (!) 97.5 F (36.4 C) (Oral)   Resp 18   Ht 4\' 2"  (1.27 m)   Wt 90 kg   SpO2 97%   BMI 55.80 kg/m  General: No acute distress Cardiac: Well-perfused extremities Lungs: No respiratory distress Psych: Appropriate mood and affect  ED Course / MDM  EKG:  I have reviewed the labs performed to date as well as medications administered while in observation.  Recent changes in the last 24 hours include none.  Plan  Current plan is for placement.   Merwyn Katos, MD 03/13/23 [phone]

## 2023-03-14 DIAGNOSIS — F209 Schizophrenia, unspecified: Secondary | ICD-10-CM | POA: Diagnosis not present

## 2023-03-14 LAB — CBG MONITORING, ED
Glucose-Capillary: 201 mg/dL — ABNORMAL HIGH (ref 70–99)
Glucose-Capillary: 135 mg/dL — ABNORMAL HIGH (ref 70–99)
Glucose-Capillary: 115 mg/dL — ABNORMAL HIGH (ref 70–99)
Glucose-Capillary: 183 mg/dL — ABNORMAL HIGH (ref 70–99)

## 2023-03-14 NOTE — ED Notes (Signed)
Pt sitting on bed in room in NAD.

## 2023-03-14 NOTE — ED Provider Notes (Signed)
Emergency Medicine Observation Re-evaluation Note  Larry Cook is a 62 y.o. male, seen on rounds today.  Pt initially presented to the ED for complaints of Psychiatric Evaluation (IVC)    Physical Exam  BP (!) 126/58 (BP Location: Left Arm)   Pulse 85   Temp 98.1 F (36.7 C) (Oral)   Resp 18   Ht 4\' 2"  (1.27 m)   Wt 90 kg   SpO2 97%   BMI 55.80 kg/m  Physical Exam General:  nad  ED Course / MDM  EKG:  I have reviewed the labs performed to date as well as medications administered while in observation.     Plan  Current plan is for soc    Willy Eddy, MD 03/14/23 [phone]

## 2023-03-14 NOTE — ED Notes (Signed)
Pt up to restroom and returned to room

## 2023-03-14 NOTE — TOC Progression Note (Addendum)
Transition of Care [facility]) - Progression Note    Patient Details  Name: Larry Cook MRN: [id number] Date of Birth: Aug 03, 1961  Transition of Care [facility]) CM/SW Contact  Kreg Shropshire, RN Phone Number: 03/14/2023, 1:33 PM  Clinical Narrative:    Cm LVM Iline Oven, [phone] in regards to pt update on placement. Awaiting callback  [phone]- Iline Oven called cm back. She stated she has been working with [facility] DSS [place]. He stated they do not have any availability in [facility]. They are looking in [facility] now.       Expected Discharge Plan and Services                                               Social Determinants of Health (SDOH) Interventions SDOH Screenings   Alcohol Screen: Low Risk  (05/16/2021)  Tobacco Use: Low Risk  (02/23/2023)    Readmission Risk Interventions     No data to display

## 2023-03-15 DIAGNOSIS — F209 Schizophrenia, unspecified: Secondary | ICD-10-CM | POA: Diagnosis not present

## 2023-03-15 LAB — CBG MONITORING, ED
Glucose-Capillary: 122 mg/dL — ABNORMAL HIGH (ref 70–99)
Glucose-Capillary: 112 mg/dL — ABNORMAL HIGH (ref 70–99)
Glucose-Capillary: 130 mg/dL — ABNORMAL HIGH (ref 70–99)
Glucose-Capillary: 120 mg/dL — ABNORMAL HIGH (ref 70–99)

## 2023-03-15 NOTE — ED Notes (Signed)
Nurse obtained blood sugar, He is cooperative, no behavioral issues, staff will continue to monitor for safety. Safety checks q 15 minutes and camera surveillance in progress.

## 2023-03-15 NOTE — ED Notes (Signed)
vol/toc.... 

## 2023-03-15 NOTE — ED Provider Notes (Signed)
Emergency Medicine Observation Re-evaluation Note  Larry Cook is a 62 y.o. male, seen on rounds today.  Pt initially presented to the ED for complaints of Psychiatric Evaluation (IVC)    Physical Exam  BP 121/70   Pulse 85   Temp (!) 97.5 F (36.4 C)   Resp 18   Ht 4\' 2"  (1.27 m)   Wt 90 kg   SpO2 97%   BMI 55.80 kg/m  Physical Exam General: nad  ED Course / MDM  EKG:  I have reviewed the labs performed to date as well as medications administered while in observation.     Plan  Current plan is for soc.    Willy Eddy, MD 03/15/23 [phone]

## 2023-03-15 NOTE — ED Notes (Signed)
No issues during shift with patient or behaviors.  Patient has been calm and cooperative.  Patient is hard of hearing.   ENVIRONMENTAL ASSESSMENT Potentially harmful objects out of patient reach: Yes.   Personal belongings secured: Yes.   Patient dressed in hospital provided attire only: Yes.   Plastic bags out of patient reach: Yes.   Patient care equipment (cords, cables, call bells, lines, and drains) shortened, removed, or accounted for: Yes.   Equipment and supplies removed from bottom of stretcher: Yes.   Potentially toxic materials out of patient reach: Yes.   Sharps container removed or out of patient reach: Yes.

## 2023-03-15 NOTE — ED Notes (Signed)
Pt received dinner tray. Larry Cook was disposed of properly

## 2023-03-15 NOTE — ED Notes (Signed)
Patient awake, took medications and breakfast given to him and beverage, He is pleasant.

## 2023-03-16 DIAGNOSIS — F209 Schizophrenia, unspecified: Secondary | ICD-10-CM | POA: Diagnosis not present

## 2023-03-16 LAB — CBG MONITORING, ED
Glucose-Capillary: 143 mg/dL — ABNORMAL HIGH (ref 70–99)
Glucose-Capillary: 134 mg/dL — ABNORMAL HIGH (ref 70–99)
Glucose-Capillary: 99 mg/dL (ref 70–99)
Glucose-Capillary: 137 mg/dL — ABNORMAL HIGH (ref 70–99)

## 2023-03-16 NOTE — ED Provider Notes (Signed)
Emergency Medicine Observation Re-evaluation Note  Larry Cook is a 62 y.o. male, seen on rounds today.  Pt initially presented to the ED for complaints of Psychiatric Evaluation (IVC)  Currently, the patient is is no acute distress. Denies any concerns at this time.  Physical Exam  Blood pressure 120/61, pulse 86, temperature (!) 97.4 F (36.3 C), temperature source Oral, resp. rate 16, height 4\' 2"  (1.27 m), weight 90 kg, SpO2 100%.  Physical Exam: Cook: No apparent distress Psych: Resting comfortably     ED Course / MDM   I have reviewed the labs performed to date as well as medications administered while in observation.  Recent changes in the last 24 hours include: No acute events overnight.  Social work reaching out to [facility] DSS social worker  Plan   Current plan: Patient awaiting social work disposition    Corena Herter, MD 03/16/23 [phone]

## 2023-03-16 NOTE — ED Notes (Signed)
Snack and drink given 

## 2023-03-16 NOTE — ED Notes (Signed)
Patient withdrawn to his room this evening. Medication complaint. Blood sugar checked. No insulin coverage needed. Encouraged to let nursing staff know of any concerns or needs.

## 2023-03-17 DIAGNOSIS — F209 Schizophrenia, unspecified: Secondary | ICD-10-CM | POA: Diagnosis not present

## 2023-03-17 LAB — CBG MONITORING, ED
Glucose-Capillary: 171 mg/dL — ABNORMAL HIGH (ref 70–99)
Glucose-Capillary: 179 mg/dL — ABNORMAL HIGH (ref 70–99)
Glucose-Capillary: 139 mg/dL — ABNORMAL HIGH (ref 70–99)

## 2023-03-17 NOTE — ED Notes (Signed)
Pt offered breakfast tray.

## 2023-03-17 NOTE — TOC Progression Note (Signed)
Transition of Care [facility]) - Progression Note    Patient Details  Name: Khyrie Heuton MRN: [id number] Date of Birth: 07-26-1961  Transition of Care [facility]) CM/SW Contact  Colette Ribas, [place] Phone Number: 03/17/2023, 12:59 PM  Clinical Narrative:     CSW spoke with patient's guardian [phone]), she expressed that she spoke with [facility], who advised the placement will be in [place], because there are no places in [place]. She expressed [facility] is going to call her once they have more info.       Expected Discharge Plan and Services                                               Social Determinants of Health (SDOH) Interventions SDOH Screenings   Alcohol Screen: Low Risk  (05/16/2021)  Tobacco Use: Low Risk  (02/23/2023)    Readmission Risk Interventions     No data to display

## 2023-03-17 NOTE — ED Notes (Signed)
Pt ate breakfast in day room, pt was redirected numerous times to eat in room, pt refusing to eat in room.

## 2023-03-17 NOTE — ED Notes (Signed)
Patient is vol pending TOC placement 

## 2023-03-17 NOTE — ED Notes (Signed)
vol/toc.... 

## 2023-03-17 NOTE — ED Notes (Signed)
PT went to bathroom.

## 2023-03-17 NOTE — ED Notes (Signed)
Ask pt if he would like to take a shower, pt shook his head no. Advised pt to change linen on his bed and provided clean linens, awaiting for pt to comply.

## 2023-03-17 NOTE — ED Notes (Addendum)
Pt will not comply with changing linen or cleaning his room. Pt is currently sitting in dayroom.

## 2023-03-17 NOTE — ED Provider Notes (Signed)
Emergency Medicine Observation Re-evaluation Note  Larry Cook is a 62 y.o. male, currently boarding in the emergency department.  No acute events since last update.  Physical Exam  BP 138/79 (BP Location: Left Arm)   Pulse 72   Temp 97.9 F (36.6 C) (Oral)   Resp 18   Ht 4\' 2"  (1.27 m)   Wt 90 kg   SpO2 99%   BMI 55.80 kg/m   ED Course / MDM   Recent CBGs are reassuring no other recent lab work available for review.  Plan  Current plan is for placement to an appropriate living facility.  Social worker is currently working with the patient to achieve this.    Minna Antis, MD 03/17/23 [phone]

## 2023-03-17 NOTE — ED Notes (Signed)
CBG is not transferring over into pts file. CBG 206

## 2023-03-18 DIAGNOSIS — F209 Schizophrenia, unspecified: Secondary | ICD-10-CM | POA: Diagnosis not present

## 2023-03-18 LAB — CBG MONITORING, ED
Glucose-Capillary: 150 mg/dL — ABNORMAL HIGH (ref 70–99)
Glucose-Capillary: 327 mg/dL — ABNORMAL HIGH (ref 70–99)
Glucose-Capillary: 84 mg/dL (ref 70–99)
Glucose-Capillary: 188 mg/dL — ABNORMAL HIGH (ref 70–99)

## 2023-03-18 NOTE — ED Notes (Signed)
Pt provided with pm snack.

## 2023-03-18 NOTE — ED Notes (Signed)
Meal tray given 

## 2023-03-18 NOTE — ED Provider Notes (Signed)
Emergency Medicine Observation Re-evaluation Note  Larry Cook is a 62 y.o. male, seen on rounds today.  Pt initially presented to the ED for complaints of Psychiatric Evaluation (IVC)  Currently, the patient is calm, no acute complaints.  Physical Exam  Blood pressure 118/76, pulse 91, temperature 98.2 F (36.8 C), temperature source Oral, resp. rate 18, height 4\' 2"  (1.27 m), weight 90 kg, SpO2 97%. Physical Exam General: NAD Lungs: CTAB Psych: not agitated  ED Course / MDM  EKG:    I have reviewed the labs performed to date as well as medications administered while in observation.  Recent changes in the last 24 hours include no acute events overnight.    Plan  Current plan is for TOC placement.   Sharman Cheek, MD 03/18/23 [phone]

## 2023-03-18 NOTE — ED Notes (Signed)
Dinner tray placed at bedside.

## 2023-03-18 NOTE — ED Notes (Signed)
Pt has started eating his dinner at this time.

## 2023-03-18 NOTE — ED Notes (Signed)
Hospital meal provided, pt tolerated w/o complaints.  Waste discarded appropriately.  

## 2023-03-19 DIAGNOSIS — F209 Schizophrenia, unspecified: Secondary | ICD-10-CM | POA: Diagnosis not present

## 2023-03-19 LAB — CBG MONITORING, ED
Glucose-Capillary: 150 mg/dL — ABNORMAL HIGH (ref 70–99)
Glucose-Capillary: 159 mg/dL — ABNORMAL HIGH (ref 70–99)
Glucose-Capillary: 153 mg/dL — ABNORMAL HIGH (ref 70–99)
Glucose-Capillary: 94 mg/dL (ref 70–99)

## 2023-03-19 NOTE — ED Notes (Signed)
VS obtained and trash removed from pts room. Snack brought to pts room for consumption.

## 2023-03-19 NOTE — ED Provider Notes (Signed)
Emergency Medicine Observation Re-evaluation Note  Larry Cook is a 62 y.o. male, seen on rounds today.  Pt initially presented to the ED for complaints of Psychiatric Evaluation (IVC)   Physical Exam  BP (!) 120/54 (BP Location: Right Arm)   Pulse 84   Temp (!) 97.5 F (36.4 C) (Oral)   Resp 18   Ht 1.27 m (4\' 2" )   Wt 90 kg   SpO2 97%   BMI 55.80 kg/m    ED Course / MDM  No clear disposition at this time per [facility]  Plan  Current plan is for [facility] dispo    Jene Every, MD 03/19/23 [phone]

## 2023-03-19 NOTE — ED Notes (Signed)
CBG 94 

## 2023-03-19 NOTE — ED Notes (Signed)
Dinner meal provided.  Meds given

## 2023-03-19 NOTE — ED Notes (Signed)
Fsbs 153

## 2023-03-19 NOTE — TOC Progression Note (Signed)
Transition of Care [facility]) - Progression Note    Patient Details  Name: Larry Cook MRN: [id number] Date of Birth: 1961-04-21  Transition of Care [facility]) CM/SW Contact  Kreg Shropshire, RN Phone Number: 03/19/2023, 1:57 PM  Clinical Narrative:     No update from Iline Oven from [facility] at this time. Cm will continue to follow       Expected Discharge Plan and Services                                               Social Determinants of Health (SDOH) Interventions SDOH Screenings   Alcohol Screen: Low Risk  (05/16/2021)  Tobacco Use: Low Risk  (02/23/2023)    Readmission Risk Interventions     No data to display

## 2023-03-20 DIAGNOSIS — F209 Schizophrenia, unspecified: Secondary | ICD-10-CM | POA: Diagnosis not present

## 2023-03-20 LAB — CBG MONITORING, ED
Glucose-Capillary: 140 mg/dL — ABNORMAL HIGH (ref 70–99)
Glucose-Capillary: 185 mg/dL — ABNORMAL HIGH (ref 70–99)
Glucose-Capillary: 131 mg/dL — ABNORMAL HIGH (ref 70–99)
Glucose-Capillary: 125 mg/dL — ABNORMAL HIGH (ref 70–99)

## 2023-03-20 NOTE — ED Notes (Signed)
Fsbs 131 ?

## 2023-03-20 NOTE — TOC Progression Note (Signed)
Transition of Care [facility]) - Progression Note    Patient Details  Name: Ruthford Kozicki MRN: [id number] Date of Birth: Mar 07, 1961  Transition of Care [facility]) CM/SW Contact  Kreg Shropshire, RN Phone Number: 03/20/2023, 5:03 PM  Clinical Narrative:      Cm sent email to Iline Oven [email or web address] about any updates on placement. Cm continue to follow for needs      Expected Discharge Plan and Services                                               Social Determinants of Health (SDOH) Interventions SDOH Screenings   Alcohol Screen: Low Risk  (05/16/2021)  Tobacco Use: Low Risk  (02/23/2023)    Readmission Risk Interventions     No data to display

## 2023-03-20 NOTE — ED Notes (Signed)
Report from amy, rn.  

## 2023-03-20 NOTE — ED Notes (Signed)
Trash removed from pts room. Pt denied wanting a snack at this time.

## 2023-03-20 NOTE — ED Notes (Signed)
vol/toc.... 

## 2023-03-20 NOTE — ED Notes (Signed)
Dinner meal provided

## 2023-03-20 NOTE — ED Notes (Signed)
Hospital meal provided, pt tolerated w/o complaints.  Waste discarded appropriately.  

## 2023-03-20 NOTE — ED Notes (Signed)
Given lunch

## 2023-03-20 NOTE — ED Provider Notes (Signed)
Emergency Medicine Observation Re-evaluation Note  [place] is a 62 y.o. male, seen on rounds today.  Pt initially presented to the ED for complaints of Psychiatric Evaluation (IVC)  Currently, the patient is is no acute distress. Denies any concerns at this time.  Physical Exam  Blood pressure 139/83, pulse 81, temperature 97.8 F (36.6 C), temperature source Oral, resp. rate 18, height 4\' 2"  (1.27 m), weight 90 kg, SpO2 97%.   ED Course / MDM   Clinical Course as of 03/20/23 0948  Thu Feb 28, 2023  2133 Informed pt is refusing all meds and insulin [EB]    Clinical Course User Index [EB] Merwyn Katos, MD    I have reviewed the labs performed to date as well as medications administered while in observation.  Recent changes in the last 24 hours include: No acute events overnight.  Plan   Current plan: Patient awaiting placement    Corena Herter, MD 03/20/23 [phone]

## 2023-03-20 NOTE — ED Notes (Signed)
Pt taking shower. Pt was given hygiene items and the following, 1 clean top, 1 clean bottom, with 1 pair of disposable underwear.  Pt changed out into clean clothing.  Staff disposed of all shower supplies.   

## 2023-03-20 NOTE — ED Notes (Signed)
Full linen change performed on pts visibly soiled linens. All trash that was not holding pts food was removed from pts room. Pt was assisted to put his fitted sheet on his bed. Pt grateful of clean linens.

## 2023-03-20 NOTE — ED Notes (Signed)
Snack trash removed from pts room by pt. Hospital dinner provided with soda of choice. Pt made aware no other drinks other than water will be provided. Pt encouraged to sit up facilitate eating.

## 2023-03-20 NOTE — ED Notes (Signed)
Snack and drink given 

## 2023-03-20 NOTE — ED Notes (Signed)
Snack was provided with soda. No other needs at this time   

## 2023-03-21 DIAGNOSIS — F209 Schizophrenia, unspecified: Secondary | ICD-10-CM | POA: Diagnosis not present

## 2023-03-21 LAB — CBG MONITORING, ED
Glucose-Capillary: 173 mg/dL — ABNORMAL HIGH (ref 70–99)
Glucose-Capillary: 269 mg/dL — ABNORMAL HIGH (ref 70–99)
Glucose-Capillary: 149 mg/dL — ABNORMAL HIGH (ref 70–99)
Glucose-Capillary: 166 mg/dL — ABNORMAL HIGH (ref 70–99)

## 2023-03-21 NOTE — ED Notes (Signed)
Report to ashley, rn

## 2023-03-21 NOTE — ED Notes (Signed)
Pt given dinner tray.

## 2023-03-21 NOTE — BH Assessment (Signed)
Patient remains non-verbal.  Unable to provide a telepsych assessment.  Patient has a psych disposition of inpatient recommendation on 03/01/2023.

## 2023-03-21 NOTE — ED Notes (Signed)
Pt went to bathroom

## 2023-03-21 NOTE — TOC Progression Note (Addendum)
Transition of Care [facility]) - Progression Note    Patient Details  Name: Larry Cook MRN: [id number] Date of Birth: 1960-12-31  Transition [facility]) CM/SW Contact  Darleene Cleaver, [place] Phone Number: 03/21/2023, 7:00 PM  Clinical Narrative:     CSW was informed by Iline Oven, from [facility], that [facility]), group home may be able to accept patient.  CSW contacted the administrator [place], [phone], and she requested copies of clinical notes, FL2, and psych eval so she can review.  Jasmine requested they are emailed to [email or web address].  CSW asked bedside nurse to see if MD can order a psych consult for and updated note.  CSW to email requested documents tomorrow.  Patient's LME is [facility], they are assisting with trying to find placement as well.  CSW to continue to follow patient's progress throughout discharge planning.          Expected Discharge Plan and Services                                               Social Determinants of Health (SDOH) Interventions SDOH Screenings   Alcohol Screen: Low Risk  (05/16/2021)  Tobacco Use: Low Risk  (02/23/2023)    Readmission Risk Interventions     No data to display

## 2023-03-21 NOTE — ED Provider Notes (Signed)
Emergency Medicine Observation Re-evaluation Note  Larry Cook is a 62 y.o. male, seen on rounds today.  Pt initially presented to the ED for complaints of Psychiatric Evaluation (IVC)  Currently, the patient is resting comfortably.  Physical Exam  BP 115/60 (BP Location: Left Arm)   Pulse 85   Temp 98.3 F (36.8 C) (Oral)   Resp 17   Ht 4\' 2"  (1.27 m)   Wt 90 kg   SpO2 99%   BMI 55.80 kg/m  General: No acute distress Cardiac: Well-perfused extremities Lungs: No respiratory distress Psych: Appropriate mood and affect  ED Course / MDM  EKG:  I have reviewed the labs performed to date as well as medications administered while in observation.  Recent changes in the last 24 hours include none.  Plan  Current plan is for placement.   Merwyn Katos, MD 03/21/23 [phone]

## 2023-03-21 NOTE — NC FL2 (Addendum)
[place] MEDICAID FL2 LEVEL OF CARE FORM     IDENTIFICATION  Patient Name: Larry Cook Birthdate: [birth year] Sex: male Admission Date (Current Location): 02/23/2023  [place] and [place] Number:  [address] [id number] T Facility and Address:  [facility], [address] [place], [place] [address]      Provider Number: [id number]  Attending Physician Name and Address:  Willy Eddy, MD  Relative Name and Phone Number:  Current Legal Guardian is [facility] [phone].    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   Intellectual disability 06/16/2021   GERD (gastroesophageal reflux disease) 05/17/2021   Hyperlipidemia 05/17/2021   Diabetes (HCC) 05/16/2021   Asthma 05/16/2021   Hypertension 05/16/2021   Dyslipidemia 05/16/2021   BMI 40.0-44.9, adult (HCC) 12/31/2019   CKD (chronic kidney disease), stage III (HCC) 06/23/2019   Diabetes mellitus type 2, uncomplicated (HCC) 06/23/2019    Orientation RESPIRATION BLADDER Height & Weight     Self, Time, Situation, Place  Normal Continent Weight: 198 lb 6.6 oz (90 kg) Height:  4\' 2"  (127 cm)  BEHAVIORAL SYMPTOMS/MOOD NEUROLOGICAL BOWEL NUTRITION STATUS  Other (Comment) (limited verbal, IDD)   Continent Diet (Medium consistant carb)  AMBULATORY STATUS COMMUNICATION OF NEEDS Skin   Independent Non-Verbally Normal                       Personal Care Assistance Level of Assistance  Bathing, Feeding, Dressing Bathing Assistance: Limited assistance Feeding assistance: Independent Dressing Assistance: Limited assistance     Functional Limitations Info  Speech (non verbal)     Speech Info: Impaired     SPECIAL CARE FACTORS FREQUENCY                       Contractures Contractures Info: Not present    Additional Factors Info  Code Status, Allergies Code Status Info: Full Allergies Info: Acetaminophen-codeine           Current Medications (03/21/2023):  This is the current hospital active medication list Current Facility-Administered Medications  Medication Dose Route Frequency Provider Last Rate Last Admin   haloperidol (HALDOL) tablet 5 mg  5 mg Oral BID PRN Bennett, Christal H, NP       insulin aspart (novoLOG) injection 0-5 Units  0-5 Units Subcutaneous QHS Phineas Semen, MD   2 Units at 03/12/23 2120   insulin aspart (novoLOG) injection 0-9 Units  0-9 Units Subcutaneous TID WC Pilar Jarvis, MD   1 Units at 03/21/23 1707   insulin aspart (novoLOG) injection 4 Units  4 Units Subcutaneous TID WC Trinna Post, MD   4 Units at 03/21/23 1708   insulin glargine-yfgn (SEMGLEE) injection 15 Units  15 Units Subcutaneous Q24H Pilar Jarvis, MD   15 Units at 03/21/23 0940   LORazepam (ATIVAN) tablet 1 mg  1 mg Oral BID PRN Chales Abrahams, NP       metFORMIN (GLUCOPHAGE) tablet 1,000 mg  1,000 mg Oral BID WC Phineas Semen, MD   1,000 mg at 03/21/23 1708   OLANZapine (ZYPREXA) tablet 10 mg  10 mg Oral BID Bennett, Christal H, NP   10 mg at 03/21/23 0941   Current Outpatient Medications  Medication Sig Dispense Refill   paliperidone (INVEGA SUSTENNA) 156 MG/ML SUSY injection Inject 0.5 mLs (78 mg total) into the muscle every 28 (twenty-eight) days. (Patient not taking: Reported on 10/29/2021) 1.2 mL 3   paliperidone (INVEGA) 3 MG 24 hr tablet Take 1 tablet (3 mg total) by mouth at bedtime. (Patient not taking: Reported on 02/24/2023) 30 tablet 1     Discharge Medications: Please see discharge summary for a list of discharge medications.  Relevant Imaging Results:  Relevant Lab Results:   Additional Information SSN# [id number]  Darleene Cleaver, [place]

## 2023-03-21 NOTE — ED Notes (Signed)
Patient receiving snack at this time.

## 2023-03-21 NOTE — Progress Notes (Signed)
   03/21/23 1200  Spiritual Encounters  Type of Visit Initial  Care provided to: Patient  Conversation partners present during encounter Nurse  Referral source Chaplain assessment  Reason for visit Routine spiritual support  OnCall Visit No  Spiritual Framework  Presenting Themes Other (comment)   Chaplain introduced herself to the patient and he said hello. I asked him what his name was and he said it was Larry Cook. I then asked how he was doing today and he just smiled. The nurse and security guard said that this is the most that the patient has spoken since has been here. Chaplain will continue to follow up with patient and his progress.

## 2023-03-22 DIAGNOSIS — F209 Schizophrenia, unspecified: Secondary | ICD-10-CM | POA: Diagnosis not present

## 2023-03-22 LAB — CBG MONITORING, ED
Glucose-Capillary: 195 mg/dL — ABNORMAL HIGH (ref 70–99)
Glucose-Capillary: 201 mg/dL — ABNORMAL HIGH (ref 70–99)
Glucose-Capillary: 90 mg/dL (ref 70–99)

## 2023-03-22 NOTE — ED Notes (Signed)
BEHAVIORAL HEALTH ROUNDING Patient sleeping: YES Patient alert and oriented: SLEEPING Behavior appropriate: SLEEPING Nutrition and fluids offered: SLEEPING Toileting and hygiene offered: SLEEPING Sitter present: No Law enforcement present: YES

## 2023-03-22 NOTE — TOC Progression Note (Addendum)
Transition of Care [facility]) - Progression Note    Patient Details  Name: Larry Cook MRN: [id number] Date of Birth: 05-30-1961  Transition of Care [facility]) CM/SW Contact  Liliana Cline, LCSW Phone Number: 03/22/2023, 9:07 AM  Clinical Narrative:    Psych unable to complete tele- assessment yesterday per notes. Asked MD to put another psych consult in so that notes can be sent to facility.   4:49- Awaiting psych eval. TOC to continue to follow.         Expected Discharge Plan and Services                                               Social Determinants of Health (SDOH) Interventions SDOH Screenings   Alcohol Screen: Low Risk  (05/16/2021)  Tobacco Use: Low Risk  (02/23/2023)    Readmission Risk Interventions     No data to display

## 2023-03-22 NOTE — ED Provider Notes (Signed)
Emergency Medicine Observation Re-evaluation Note  Larry Cook is a 62 y.o. male, seen on rounds today.  Pt initially presented to the ED for complaints of Psychiatric Evaluation (IVC)    Physical Exam  BP 126/66 (BP Location: Right Arm)   Pulse 90   Temp 97.8 F (36.6 C) (Oral)   Resp 18   Ht 4\' 2"  (1.27 m)   Wt 90 kg   SpO2 98%   BMI 55.80 kg/m  Physical Exam General:  nad  ED Course / MDM  EKG:  I have reviewed the labs performed to date as well as medications administered while in observation.     Plan  Current plan is for soc.    Willy Eddy, MD 03/22/23 [phone]

## 2023-03-22 NOTE — ED Notes (Signed)
Dinner tray given

## 2023-03-23 DIAGNOSIS — F209 Schizophrenia, unspecified: Secondary | ICD-10-CM | POA: Diagnosis not present

## 2023-03-23 LAB — CBG MONITORING, ED
Glucose-Capillary: 162 mg/dL — ABNORMAL HIGH (ref 70–99)
Glucose-Capillary: 179 mg/dL — ABNORMAL HIGH (ref 70–99)
Glucose-Capillary: 137 mg/dL — ABNORMAL HIGH (ref 70–99)
Glucose-Capillary: 135 mg/dL — ABNORMAL HIGH (ref 70–99)

## 2023-03-23 NOTE — ED Notes (Signed)
Patient is vol pending placement 

## 2023-03-23 NOTE — ED Notes (Signed)
Pt given snack. 

## 2023-03-23 NOTE — ED Provider Notes (Signed)
-----------------------------------------   6:59 AM on 03/23/2023 -----------------------------------------   Blood pressure (!) 104/58, pulse 79, temperature 97.8 F (36.6 C), temperature source Oral, resp. rate 18, height 1.27 m (4\' 2" ), weight 90 kg, SpO2 93%.  The patient is calm and cooperative at this time.  There have been no acute events since the last update.  Awaiting disposition plan from [facility] team.   Loleta Rose, MD 03/23/23 [phone]

## 2023-03-23 NOTE — ED Notes (Signed)
This NT provided pt with pm snack. 

## 2023-03-23 NOTE — ED Notes (Signed)
Breakfast tray provided. 

## 2023-03-23 NOTE — ED Notes (Signed)
Patient received lunch tray and beverage, no signs of distress.

## 2023-03-23 NOTE — ED Notes (Signed)
Patient up to the bathroom.

## 2023-03-24 DIAGNOSIS — F209 Schizophrenia, unspecified: Secondary | ICD-10-CM | POA: Diagnosis not present

## 2023-03-24 LAB — CBG MONITORING, ED
Glucose-Capillary: 168 mg/dL — ABNORMAL HIGH (ref 70–99)
Glucose-Capillary: 165 mg/dL — ABNORMAL HIGH (ref 70–99)
Glucose-Capillary: 182 mg/dL — ABNORMAL HIGH (ref 70–99)
Glucose-Capillary: 152 mg/dL — ABNORMAL HIGH (ref 70–99)

## 2023-03-24 NOTE — ED Provider Notes (Signed)
Emergency Medicine Observation Re-evaluation Note  Larry Cook is a 62 y.o. male, seen on rounds today.  Pt initially presented to the ED for complaints of Psychiatric Evaluation (IVC)    Physical Exam  BP 119/67 (BP Location: Left Arm)   Pulse 79   Temp 98.7 F (37.1 C) (Oral)   Resp 19   Ht 4\' 2"  (1.27 m)   Wt 90 kg   SpO2 97%   BMI 55.80 kg/m  Physical Exam General: nad  ED Course / MDM  EKG:  I have reviewed the labs performed to date as well as medications administered while in observation.    Plan  Current plan is for soc.    Willy Eddy, MD 03/24/23 0700

## 2023-03-24 NOTE — ED Notes (Signed)
Breakfast tray provided. 

## 2023-03-24 NOTE — ED Notes (Signed)
AM snack provided

## 2023-03-24 NOTE — ED Notes (Signed)
Pt given a dinner tray

## 2023-03-24 NOTE — ED Notes (Signed)
Lunch tray given to pt.

## 2023-03-24 NOTE — ED Notes (Signed)
Patient is vol pending TOC placement 

## 2023-03-25 DIAGNOSIS — F209 Schizophrenia, unspecified: Secondary | ICD-10-CM | POA: Diagnosis not present

## 2023-03-25 DIAGNOSIS — F203 Undifferentiated schizophrenia: Secondary | ICD-10-CM | POA: Diagnosis not present

## 2023-03-25 LAB — CBG MONITORING, ED
Glucose-Capillary: 159 mg/dL — ABNORMAL HIGH (ref 70–99)
Glucose-Capillary: 198 mg/dL — ABNORMAL HIGH (ref 70–99)
Glucose-Capillary: 142 mg/dL — ABNORMAL HIGH (ref 70–99)
Glucose-Capillary: 180 mg/dL — ABNORMAL HIGH (ref 70–99)
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
## 2023-03-25 NOTE — ED Notes (Signed)
Patient consumed 100% of lunch and beverage.

## 2023-03-25 NOTE — TOC Progression Note (Signed)
Transition of Care [facility]) - Progression Note    Patient Details  Name: Larry Cook MRN: [id number] Date of Birth: 02-10-1961  Transition of Care [facility]) CM/SW Contact  Kreg Shropshire, RN Phone Number: 03/25/2023, 3:05 PM  Clinical Narrative:     Cm sent updated Psych evaluation, FL2, and clinical notes to administrator Jasmine at [email or web address]. She is Corporate treasurer for [employer] ([facility] group home). Cc'ed supervisor Windell Moulding on email.       Expected Discharge Plan and Services                                               Social Determinants of Health (SDOH) Interventions SDOH Screenings   Alcohol Screen: Low Risk  (05/16/2021)  Tobacco Use: Low Risk  (02/23/2023)    Readmission Risk Interventions     No data to display

## 2023-03-25 NOTE — ED Notes (Signed)
Patient provided with breakfast tray.

## 2023-03-25 NOTE — TOC Progression Note (Signed)
Transition of Care [facility]) - Progression Note    Patient Details  Name: Larry Cook MRN: [id number] Date of Birth: 05/24/1961  Transition [facility] Care [facility]) CM/SW Contact  Halford Chessman Phone Number: 03/25/2023, 3:43 PM  Clinical Narrative:     Conference video call scheduled for Tuesday at 1:30pm with Vallery Ridge, [phone] intake specialist at [facility] residential care facilities.       Expected Discharge Plan and Services                                               Social Determinants of Health (SDOH) Interventions SDOH Screenings   Alcohol Screen: Low Risk  (05/16/2021)  Tobacco Use: Low Risk  (02/23/2023)    Readmission Risk Interventions     No data to display

## 2023-03-25 NOTE — ED Provider Notes (Signed)
Emergency Medicine Observation Re-evaluation Note  Larry Cook is a 62 y.o. male, seen on rounds today.  Pt initially presented to the ED for complaints of Psychiatric Evaluation (IVC)  Currently, the patient is is no acute distress. Denies any concerns at this time.  Physical Exam  Blood pressure (!) 144/93, pulse 96, temperature 98.1 F (36.7 C), temperature source Oral, resp. rate 18, height 4\' 2"  (1.27 m), weight 90 kg, SpO2 97%.  Physical Exam: General: No apparent distress Psych: Resting comfortably     ED Course / MDM     I have reviewed the labs performed to date as well as medications administered while in observation.  Recent changes in the last 24 hours include: No acute events overnight.  Plan   Current plan: Patient awaiting social work disposition    Corena Herter, MD 03/25/23 1029

## 2023-03-26 DIAGNOSIS — F209 Schizophrenia, unspecified: Secondary | ICD-10-CM | POA: Diagnosis not present

## 2023-03-26 LAB — CBG MONITORING, ED
Glucose-Capillary: 173 mg/dL — ABNORMAL HIGH (ref 70–99)
Glucose-Capillary: 234 mg/dL — ABNORMAL HIGH (ref 70–99)
Glucose-Capillary: 192 mg/dL — ABNORMAL HIGH (ref 70–99)
Glucose-Capillary: 110 mg/dL — ABNORMAL HIGH (ref 70–99)

## 2023-03-26 NOTE — ED Notes (Signed)
Patient is cooperative, ate 100% of breakfast and beverage, no signs of distress.

## 2023-03-26 NOTE — ED Provider Notes (Signed)
Emergency Medicine Observation Re-evaluation Note  Larry Cook is a 62 y.o. male, seen on rounds today.  Pt initially presented to the ED for complaints of Psychiatric Evaluation (IVC)  Currently, the patient is resting comfortably.  Physical Exam  BP 129/79   Pulse 89   Temp 98.2 F (36.8 C) (Oral)   Resp 17   Ht 4\' 2"  (1.27 m)   Wt 90 kg   SpO2 97%   BMI 55.80 kg/m  General: No acute distress Cardiac: Well-perfused extremities Lungs: No respiratory distress Psych: Appropriate mood and affect  ED Course / MDM  EKG:  I have reviewed the labs performed to date as well as medications administered while in observation.  Recent changes in the last 24 hours include none.  Plan  Current plan is for placement.   Merwyn Katos, MD 03/26/23 [phone]

## 2023-03-26 NOTE — ED Provider Notes (Signed)
-----------------------------------------   8:43 AM on 03/26/2023 -----------------------------------------   Blood pressure 129/79, pulse 89, temperature 98.2 F (36.8 C), temperature source Oral, resp. rate 17, height 4\' 2"  (1.27 m), weight 90 kg, SpO2 97%.  The patient is calm and cooperative at this time.  There have been no acute events since the last update.  Awaiting disposition plan from [facility] team.   Janith Lima, MD 03/26/23 [phone]

## 2023-03-26 NOTE — TOC Progression Note (Signed)
Transition of Care [facility]) - Progression Note    Patient Details  Name: Larry Cook MRN: [id number] Date of Birth: March 17, 1961  Transition of Care [facility]) CM/SW Contact  Halford Chessman Phone Number: 03/26/2023, 6:04 PM  Clinical Narrative:     CSW spoke to Cecile Sheerer from [facility]) group home.  She stated she will review findings from assessment with her supervisor and let CSW know if they can offer a bed for the patient.  Waiting for a call back or email from [facility]                                               Social Determinants of Health (SDOH) Interventions SDOH Screenings   Alcohol Screen: Low Risk  (05/16/2021)  Tobacco Use: Low Risk  (02/23/2023)    Readmission Risk Interventions     No data to display

## 2023-03-26 NOTE — ED Notes (Signed)
Snack given.

## 2023-03-26 NOTE — ED Notes (Signed)
vol/toc.... 

## 2023-03-27 DIAGNOSIS — F203 Undifferentiated schizophrenia: Secondary | ICD-10-CM | POA: Diagnosis not present

## 2023-03-27 DIAGNOSIS — F209 Schizophrenia, unspecified: Secondary | ICD-10-CM | POA: Diagnosis not present

## 2023-03-27 LAB — CBG MONITORING, ED
Glucose-Capillary: 151 mg/dL — ABNORMAL HIGH (ref 70–99)
Glucose-Capillary: 246 mg/dL — ABNORMAL HIGH (ref 70–99)
Glucose-Capillary: 255 mg/dL — ABNORMAL HIGH (ref 70–99)
Glucose-Capillary: 149 mg/dL — ABNORMAL HIGH (ref 70–99)

## 2023-03-27 NOTE — ED Notes (Signed)
Snack and drink given 
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
## 2023-03-27 NOTE — ED Provider Notes (Signed)
Vitals:   03/26/23 1428 03/26/23 1952  BP: 133/62 137/72  Pulse: 93 97  Resp: 20 18  Temp: 98 F (36.7 C) 98.2 F (36.8 C)  SpO2: 96% 97%   Patient is currently sleeping without distress.  Per nursing [facility]ail) team, no events of note occurred overnight.  There have been no acute events since the last update.  Awaiting disposition plan from [facility] team.     Sharyn Creamer, MD 03/27/23 [phone]

## 2023-03-27 NOTE — ED Notes (Signed)

## 2023-03-28 DIAGNOSIS — F209 Schizophrenia, unspecified: Secondary | ICD-10-CM | POA: Diagnosis not present

## 2023-03-28 LAB — CBG MONITORING, ED
Glucose-Capillary: 167 mg/dL — ABNORMAL HIGH (ref 70–99)
Glucose-Capillary: 244 mg/dL — ABNORMAL HIGH (ref 70–99)
Glucose-Capillary: 136 mg/dL — ABNORMAL HIGH (ref 70–99)
Glucose-Capillary: 189 mg/dL — ABNORMAL HIGH (ref 70–99)

## 2023-03-28 NOTE — ED Provider Notes (Signed)
-----------------------------------------   7:22 AM on 03/28/2023 -----------------------------------------   Blood pressure (!) 156/79, pulse 92, temperature 98.3 F (36.8 C), temperature source Oral, resp. rate 18, height 4\' 2"  (1.27 m), weight 90 kg, SpO2 99%.  The patient is sleeping at this time.  There have been no acute events since the last update per nursing staff.  Awaiting disposition plan from [facility] team.   Janith Lima, MD 03/28/23 [phone]

## 2023-03-28 NOTE — TOC Progression Note (Signed)
Transition of Care [facility]) - Progression Note    Patient Details  Name: Larry Cook MRN: [id number] Date of Birth: 09-May-1961  Transition of Care [facility]) CM/SW Contact  Halford Chessman Phone Number: 03/28/2023, 6:43 PM  Clinical Narrative:     CSW awaiting decision from group home if they can accept patient.        Expected Discharge Plan and Services                                               Social Determinants of Health (SDOH) Interventions SDOH Screenings   Alcohol Screen: Low Risk  (05/16/2021)  Tobacco Use: Low Risk  (02/23/2023)    Readmission Risk Interventions     No data to display

## 2023-03-28 NOTE — ED Notes (Signed)
Pt was provided with pm snack.

## 2023-03-29 DIAGNOSIS — F209 Schizophrenia, unspecified: Secondary | ICD-10-CM | POA: Diagnosis not present

## 2023-03-29 LAB — CBG MONITORING, ED
Glucose-Capillary: 143 mg/dL — ABNORMAL HIGH (ref 70–99)
Glucose-Capillary: 183 mg/dL — ABNORMAL HIGH (ref 70–99)
Glucose-Capillary: 97 mg/dL (ref 70–99)
Glucose-Capillary: 173 mg/dL — ABNORMAL HIGH (ref 70–99)

## 2023-03-29 NOTE — ED Notes (Signed)
Hospital meal provided.  100% consumed, pt tolerated w/o complaints.  Waste discarded appropriately.   

## 2023-03-29 NOTE — ED Notes (Signed)
Patient CBG 97.

## 2023-03-29 NOTE — ED Provider Notes (Signed)
Emergency Medicine Observation Re-evaluation Note  Larry Cook is a 62 y.o. male, seen on rounds today.  Pt initially presented to the ED for complaints of Psychiatric Evaluation (IVC)  Currently, the patient is resting in bed. No reported issues from nursing team.   Physical Exam  BP (!) 154/85 (BP Location: Right Arm)   Pulse 80   Temp 97.8 F (36.6 C) (Oral)   Resp 17   Ht 4\' 2"  (1.27 m)   Wt 90 kg   SpO2 97%   BMI 55.80 kg/m  Physical Exam General: Resting comfortably in bed  ED Course / MDM   Plan  Current plan is for dispo per social work .    Trinna Post, MD 03/29/23 [phone]

## 2023-03-29 NOTE — TOC Progression Note (Signed)
Transition of Care [facility]) - Progression Note    Patient Details  Name: Laron Burge MRN: [id number] Date of Birth: [birth year]  Transition of Care [facility]) CM/SW Contact  Kreg Shropshire, RN Phone Number: 03/29/2023, 8:51 AM  Clinical Narrative:      Cm sent updated Psych consult notes on 8.7.24 to Cecile Sheerer from [facility]) group home [email or web address]       Expected Discharge Plan and Services                                               Social Determinants of Health (SDOH) Interventions SDOH Screenings   Alcohol Screen: Low Risk  (05/16/2021)  Tobacco Use: Low Risk  (02/23/2023)    Readmission Risk Interventions     No data to display

## 2023-03-29 NOTE — ED Notes (Signed)
Snack and drink given 

## 2023-03-30 DIAGNOSIS — F209 Schizophrenia, unspecified: Secondary | ICD-10-CM | POA: Diagnosis not present

## 2023-03-30 LAB — CBG MONITORING, ED
Glucose-Capillary: 160 mg/dL — ABNORMAL HIGH (ref 70–99)
Glucose-Capillary: 177 mg/dL — ABNORMAL HIGH (ref 70–99)
Glucose-Capillary: 175 mg/dL — ABNORMAL HIGH (ref 70–99)
Glucose-Capillary: 126 mg/dL — ABNORMAL HIGH (ref 70–99)

## 2023-03-30 NOTE — ED Notes (Signed)
This tech obtained vital signs on pt.  

## 2023-03-30 NOTE — ED Notes (Signed)
Breakfast tray given. °

## 2023-03-30 NOTE — Group Note (Deleted)
Date:  03/30/2023 Time:  2:12 AM  Group Topic/Focus:  Wrap-Up Group:   The focus of this group is to help patients review their daily goal of treatment and discuss progress on daily workbooks.     Participation Level:  {BHH PARTICIPATION ZOXWR:60454}  Participation Quality:  {BHH PARTICIPATION QUALITY:22265}  Affect:  {BHH AFFECT:22266}  Cognitive:  {BHH COGNITIVE:22267}  Insight: {BHH Insight2:20797}  Engagement in Group:  {BHH ENGAGEMENT IN UJWJX:91478}  Modes of Intervention:  {BHH MODES OF INTERVENTION:22269}  Additional Comments:  ***  Maglione,Keniesha Adderly E 03/30/2023, 2:12 AM

## 2023-03-30 NOTE — TOC CM/SW Note (Signed)
CSW reached out to [facility] for update, left message.

## 2023-03-30 NOTE — ED Provider Notes (Signed)
Emergency Medicine Observation Re-evaluation Note  Larry Cook is a 62 y.o. male, seen on rounds today.  Pt initially presented to the ED for complaints of Psychiatric Evaluation (IVC) Currently, the patient is calm, in NAD.  Physical Exam  BP (!) 151/91 (BP Location: Right Arm)   Pulse 98   Temp 98.3 F (36.8 C) (Oral)   Resp 18   Ht 4\' 2"  (1.27 m)   Wt 90 kg   SpO2 100%   BMI 55.80 kg/m    ED Course / MDM  EKG:  I have reviewed the labs performed to date as well as medications administered while in observation.  Recent changes in the last 24 hours include none.  Plan  Current plan is for TOC dispo.    Shaune Pollack, MD 03/30/23 [phone]

## 2023-03-30 NOTE — ED Notes (Signed)
 This pt given snack and beverage.

## 2023-03-31 DIAGNOSIS — F209 Schizophrenia, unspecified: Secondary | ICD-10-CM | POA: Diagnosis not present

## 2023-03-31 LAB — CBG MONITORING, ED
Glucose-Capillary: 122 mg/dL — ABNORMAL HIGH (ref 70–99)
Glucose-Capillary: 159 mg/dL — ABNORMAL HIGH (ref 70–99)
Glucose-Capillary: 194 mg/dL — ABNORMAL HIGH (ref 70–99)
Glucose-Capillary: 129 mg/dL — ABNORMAL HIGH (ref 70–99)

## 2023-03-31 NOTE — ED Notes (Signed)
Patient is vol pending TOC placement 

## 2023-03-31 NOTE — ED Provider Notes (Signed)
Emergency Medicine Observation Re-evaluation Note  Larry Cook is a 62 y.o. male, seen on rounds today.  Pt initially presented to the ED for complaints of Psychiatric Evaluation (IVC)  Currently, the patient is resting comfortably.  Physical Exam  BP (!) 152/99 (BP Location: Left Arm)   Pulse 82   Temp (!) 97.5 F (36.4 C) (Oral)   Resp 18   Ht 4\' 2"  (1.27 m)   Wt 90 kg   SpO2 95%   BMI 55.80 kg/m  General: No acute distress Cardiac: Well-perfused extremities Lungs: No respiratory distress Psych: Appropriate mood and affect  ED Course / MDM  EKG:  I have reviewed the labs performed to date as well as medications administered while in observation.  Recent changes in the last 24 hours include none.  Plan  Current plan is for placement.   Merwyn Katos, MD 03/31/23 [phone]

## 2023-03-31 NOTE — ED Notes (Signed)
Pt was provided with clean linen, clean clothing and hygiene items. Pt encouraged to shower. Pt nodded his head in agreement.

## 2023-03-31 NOTE — ED Notes (Signed)
Pt provided with breakfast tray.

## 2023-03-31 NOTE — ED Notes (Signed)
Dinner tray provided

## 2023-04-01 DIAGNOSIS — F209 Schizophrenia, unspecified: Secondary | ICD-10-CM | POA: Diagnosis not present

## 2023-04-01 LAB — CBG MONITORING, ED
Glucose-Capillary: 100 mg/dL — ABNORMAL HIGH (ref 70–99)
Glucose-Capillary: 179 mg/dL — ABNORMAL HIGH (ref 70–99)
Glucose-Capillary: 163 mg/dL — ABNORMAL HIGH (ref 70–99)
Glucose-Capillary: 156 mg/dL — ABNORMAL HIGH (ref 70–99)

## 2023-04-01 NOTE — TOC Progression Note (Addendum)
Transition of Care [facility]) - Progression Note    Patient Details  Name: Jaired Kennemore MRN: [id number] Date of Birth: 08/02/1961  Transition [facility] Care [facility]) CM/SW Contact  Kreg Shropshire, RN Phone Number: 04/01/2023, 10:16 AM  Clinical Narrative:      Cm sent email to Jasmin with administration at [email or web address] to get updates.Cm awaiting to hear back.  1414-Received email from [facility] stating the following: "Unfortunately, we have not received any information from Mr. Pipes sister regarding his income. Without this crucial information, we are unable to proceed with his placement, and we have come to a standstill in the process."  Cm called pt sister Velvet. She stated pt gets around $900 a month for disability. Cm asked if the group home stated how to get that information sent to them. Cm sent email back to Jasmin asking if she could inform Velvet on specific documentation related to income.  1610- Cm sent message to [facility] that she needs to email or fax social security benefit information to group home [email or web address]      Expected Discharge Plan and Services                                               Social Determinants of Health (SDOH) Interventions SDOH Screenings   Alcohol Screen: Low Risk  (05/16/2021)  Tobacco Use: Low Risk  (02/23/2023)    Readmission Risk Interventions     No data to display

## 2023-04-01 NOTE — ED Provider Notes (Signed)
Emergency Medicine Observation Re-evaluation Note  Larry Cook is a 62 y.o. male, seen on rounds today.  Pt initially presented to the ED for complaints of Psychiatric Evaluation (IVC) Currently, the patient is resting comfortably.  Physical Exam  BP 137/68 (BP Location: Right Arm)   Pulse 87   Temp 97.7 F (36.5 C) (Oral)   Resp 18   Ht 4\' 2"  (1.27 m)   Wt 90 kg   SpO2 98%   BMI 55.80 kg/m  Physical Exam General: no acute distress Cardiac: extremities well perfused Lungs: normal work of breathing Psych: appropriate mood, calm  ED Course / MDM  EKG:EKG Interpretation Date/Time:  Saturday March 09 2023 11:13:43 EDT Ventricular Rate:  81 PR Interval:  192 QRS Duration:  152 QT Interval:  422 QTC Calculation: 490 R Axis:   -73  Text Interpretation: Normal sinus rhythm Right bundle branch block Left anterior fascicular block  Bifascicular block  Left ventricular hypertrophy with repolarization abnormality ( R in aVL , Romhilt-Estes ) Cannot rule out Septal infarct , age undetermined Abnormal ECG When compared with ECG of 26-Feb-2023 12:52, PREVIOUS ECG IS PRESENT Confirmed by UNCONFIRMED, DOCTOR ([id number]), editor Lonell Face [phone]) on 03/11/2023 7:17:16 AM  I have reviewed the labs performed to date as well as medications administered while in observation.  Recent changes in the last 24 hours include no changes.  Plan  Current plan is for placement.    Janith Lima, MD 04/01/23 [phone]

## 2023-04-02 DIAGNOSIS — F209 Schizophrenia, unspecified: Secondary | ICD-10-CM | POA: Diagnosis not present

## 2023-04-02 LAB — CBG MONITORING, ED
Glucose-Capillary: 126 mg/dL — ABNORMAL HIGH (ref 70–99)
Glucose-Capillary: 160 mg/dL — ABNORMAL HIGH (ref 70–99)
Glucose-Capillary: 74 mg/dL (ref 70–99)
Glucose-Capillary: 98 mg/dL (ref 70–99)

## 2023-04-02 NOTE — ED Provider Notes (Signed)
Emergency Medicine Observation Re-evaluation Note  Physical Exam   BP 124/66 (BP Location: Left Arm)   Pulse 77   Temp 97.6 F (36.4 C) (Oral)   Resp 18   Ht 4\' 2"  (1.27 m)   Wt 90 kg   SpO2 96%   BMI 55.80 kg/m   Patient appears in no acute distress.  ED Course / MDM   No reported events during my shift at the time of this note.   Pt is awaiting dispo from SW   Pilar Jarvis MD    Pilar Jarvis, MD 04/02/23 [phone]

## 2023-04-02 NOTE — ED Notes (Addendum)
Pt given dinner tray and placed in room with pt.

## 2023-04-02 NOTE — ED Notes (Signed)
Pt was given a snack. Was given a plain bagel.

## 2023-04-02 NOTE — TOC Progression Note (Addendum)
Transition of Care [facility]) - Progression Note    Patient Details  Name: Larry Cook MRN: [id number] Date of Birth: [birth year]  Transition [facility]) CM/SW Contact  Kreg Shropshire, RN Phone Number: 04/02/2023, 12:21 PM  Clinical Narrative:     Cm received call from [facility], pt sister in regards to pt social security paperwork. Velvet stated she is looking for paperwork and will fax to them as soon as she can find it. Cm emailed group home to inform them.  1453- Received email from group home administration. It stated the following "Once we have that I can send everything to the Q[facility] to determine which house would be best for Larry Cook"      Expected Discharge Plan and Services                                               Social Determinants of Health (SDOH) Interventions SDOH Screenings   Alcohol Screen: Low Risk  (05/16/2021)  Tobacco Use: Low Risk  (02/23/2023)    Readmission Risk Interventions     No data to display

## 2023-04-02 NOTE — ED Notes (Signed)
Pt provided with lunch tray.

## 2023-04-02 NOTE — ED Notes (Signed)
Blood glucose was 74. RN, Amy, was notified.

## 2023-04-03 DIAGNOSIS — F209 Schizophrenia, unspecified: Secondary | ICD-10-CM | POA: Diagnosis not present

## 2023-04-03 LAB — CBG MONITORING, ED
Glucose-Capillary: 97 mg/dL (ref 70–99)
Glucose-Capillary: 167 mg/dL — ABNORMAL HIGH (ref 70–99)
Glucose-Capillary: 173 mg/dL — ABNORMAL HIGH (ref 70–99)
Glucose-Capillary: 224 mg/dL — ABNORMAL HIGH (ref 70–99)

## 2023-04-03 NOTE — ED Notes (Signed)
Hospital meal provided.  100% consumed, pt tolerated w/o complaints.  Waste discarded appropriately.   

## 2023-04-03 NOTE — ED Notes (Signed)
Pt given lunch tray.

## 2023-04-03 NOTE — ED Notes (Signed)
Encouraged patient to tidy room, provided trash can for patient to throw away any trash in patient room with staff supervision.  

## 2023-04-03 NOTE — ED Provider Notes (Signed)
Emergency Medicine Observation Re-evaluation Note  Larry Cook is a 62 y.o. male, seen on rounds today.  Pt initially presented to the ED for complaints of Psychiatric Evaluation (IVC)  Currently, the patient is resting in bed. No reported issues overnight from nursing team.   Physical Exam  BP (!) 146/69 (BP Location: Right Arm)   Pulse 90   Temp 98 F (36.7 C) (Oral)   Resp 18   Ht 4\' 2"  (1.27 m)   Wt 90 kg   SpO2 100%   BMI 55.80 kg/m  Physical Exam General: Resting comfortably in bed  ED Course / MDM  BGL 74-160 last 24 hours   Plan  Current plan is for dispo per social work.    Trinna Post, MD 04/03/23 [phone]

## 2023-04-03 NOTE — ED Notes (Signed)
 Pt provided with pm snack.

## 2023-04-03 NOTE — ED Notes (Signed)
Did not eat full breakfast meal, 25%.

## 2023-04-03 NOTE — ED Notes (Signed)
Dinner given

## 2023-04-04 DIAGNOSIS — F209 Schizophrenia, unspecified: Secondary | ICD-10-CM | POA: Diagnosis not present

## 2023-04-04 LAB — CBG MONITORING, ED
Glucose-Capillary: 92 mg/dL (ref 70–99)
Glucose-Capillary: 172 mg/dL — ABNORMAL HIGH (ref 70–99)
Glucose-Capillary: 104 mg/dL — ABNORMAL HIGH (ref 70–99)
Glucose-Capillary: 136 mg/dL — ABNORMAL HIGH (ref 70–99)

## 2023-04-04 NOTE — ED Notes (Signed)
Dinner tray provided

## 2023-04-04 NOTE — ED Notes (Addendum)
During nursing assessment Larry Larry Cook was A/Ox 3 .  Marland KitchenHe  stated that  he is not currently have thoughts or feelings of SI/HI.  Larry Cook reported he does continue to have auditory hallucinations.  Pt affect is euthymic, eye contact is intermittent , speech is mumbled  with appropriate verbiage noted.  Staff addressed any feelings or concerns that have been brought up.  Medications were administered as ordered. Continue to monitor patient as ordered for any changes in behaviors and for continued safety.

## 2023-04-04 NOTE — ED Notes (Signed)
No issues during shift. Patient took PO medication as ordered.   ENVIRONMENTAL ASSESSMENT Potentially harmful objects out of patient reach: Yes.   Personal belongings secured: Yes.   Patient dressed in hospital provided attire only: Yes.   Plastic bags out of patient reach: Yes.   Patient care equipment (cords, cables, call bells, lines, and drains) shortened, removed, or accounted for: Yes.   Equipment and supplies removed from bottom of stretcher: Yes.   Potentially toxic materials out of patient reach: Yes.   Sharps container removed or out of patient reach: Yes.

## 2023-04-04 NOTE — TOC Progression Note (Signed)
Transition of Care [facility]) - Progression Note    Patient Details  Name: Larry Cook MRN: [id number] Date of Birth: 20-Mar-1961  Transition of Care [facility]) CM/SW Contact  Kreg Shropshire, RN Phone Number: 04/04/2023, 4:11 PM  Clinical Narrative:     Received called from sister Velvet that social security is mailing her pt social security benefits so she can turn into group home.       Expected Discharge Plan and Services                                               Social Determinants of Health (SDOH) Interventions SDOH Screenings   Alcohol Screen: Low Risk  (05/16/2021)  Tobacco Use: Low Risk  (02/23/2023)    Readmission Risk Interventions     No data to display

## 2023-04-04 NOTE — ED Notes (Signed)
Hospital meal provided, pt tolerated w/o complaints.  Waste discarded appropriately.  

## 2023-04-04 NOTE — ED Provider Notes (Signed)
Emergency Medicine Observation Re-evaluation Note  Larry Cook is a 62 y.o. male, currently boarding in the emergency department.  No acute events since last update.  Physical Exam  BP 126/73 (BP Location: Left Arm)   Pulse 83   Temp 98.4 F (36.9 C) (Oral)   Resp 17   Ht 4\' 2"  (1.27 m)   Wt 90 kg   SpO2 97%   BMI 55.80 kg/m   ED Course / MDM   No recent lab work available for review besides CBGs which appear to be largely well-controlled.  Plan  Current plan is for placement to an appropriate living facility once available.  Social worker is currently working with the patient to achieve this.    Minna Antis, MD 04/04/23 [phone]

## 2023-04-05 DIAGNOSIS — F209 Schizophrenia, unspecified: Secondary | ICD-10-CM | POA: Diagnosis not present

## 2023-04-05 LAB — CBG MONITORING, ED
Glucose-Capillary: 127 mg/dL — ABNORMAL HIGH (ref 70–99)
Glucose-Capillary: 145 mg/dL — ABNORMAL HIGH (ref 70–99)
Glucose-Capillary: 85 mg/dL (ref 70–99)
Glucose-Capillary: 137 mg/dL — ABNORMAL HIGH (ref 70–99)
Glucose-Capillary: 158 mg/dL — ABNORMAL HIGH (ref 70–99)

## 2023-04-05 NOTE — ED Notes (Signed)
Pt consumed 100% of snack and drink

## 2023-04-05 NOTE — ED Notes (Signed)
Snack and drink given 

## 2023-04-06 DIAGNOSIS — F209 Schizophrenia, unspecified: Secondary | ICD-10-CM | POA: Diagnosis not present

## 2023-04-06 LAB — CBG MONITORING, ED
Glucose-Capillary: 103 mg/dL — ABNORMAL HIGH (ref 70–99)
Glucose-Capillary: 140 mg/dL — ABNORMAL HIGH (ref 70–99)
Glucose-Capillary: 98 mg/dL (ref 70–99)
Glucose-Capillary: 107 mg/dL — ABNORMAL HIGH (ref 70–99)

## 2023-04-06 NOTE — ED Provider Notes (Signed)
Emergency Medicine Observation Re-evaluation Note  Larry Cook is a 62 y.o. male, seen on rounds today.  Pt initially presented to the ED for complaints of Psychiatric Evaluation (IVC)   Physical Exam  BP 123/67 (BP Location: Left Arm)   Pulse 85   Temp 97.6 F (36.4 C) (Oral)   Resp 17   Ht 1.27 m (4\' 2" )   Wt 90 kg   SpO2 97%   BMI 55.80 kg/m  Physical Exam No issues reported overnight  ED Course / MDM     Plan  Current plan is for [facility] is attempting to place the patient into a group home    Jene Every, MD 04/06/23 [phone]

## 2023-04-06 NOTE — ED Notes (Signed)
Patient provided snack at appropriate snack time.  Pt consumed 100% of snack provided, tolerated well w/o complaints   Trash disposted of appropriately by patient.  

## 2023-04-06 NOTE — ED Notes (Signed)
Hospital meal provided, pt tolerated w/o complaints.  Waste discarded appropriately.  

## 2023-04-06 NOTE — ED Notes (Signed)
During nursing assessment Mr Larry Cook did not participate in the daily assessment.  He was nonverbal and refused to answer question from staff. Cont to monitor as ordered.

## 2023-04-06 NOTE — ED Notes (Signed)
Patient is vol pending TOC placement 

## 2023-04-07 DIAGNOSIS — F209 Schizophrenia, unspecified: Secondary | ICD-10-CM | POA: Diagnosis not present

## 2023-04-07 LAB — CBG MONITORING, ED
Glucose-Capillary: 124 mg/dL — ABNORMAL HIGH (ref 70–99)
Glucose-Capillary: 63 mg/dL — ABNORMAL LOW (ref 70–99)
Glucose-Capillary: 92 mg/dL (ref 70–99)
Glucose-Capillary: 162 mg/dL — ABNORMAL HIGH (ref 70–99)
Glucose-Capillary: 124 mg/dL — ABNORMAL HIGH (ref 70–99)

## 2023-04-07 NOTE — ED Notes (Signed)
ENVIRONMENTAL ASSESSMENT Potentially harmful objects out of patient reach: Yes.   Personal belongings secured: Yes.   Patient dressed in hospital provided attire only: Yes.   Plastic bags out of patient reach: Yes.   Patient care equipment (cords, cables, call bells, lines, and drains) shortened, removed, or accounted for: Yes.   Equipment and supplies removed from bottom of stretcher: Yes.   Potentially toxic materials out of patient reach: Yes.   Sharps container removed or out of patient reach: Yes.    

## 2023-04-07 NOTE — ED Notes (Signed)
Patient received snack.  

## 2023-04-07 NOTE — ED Notes (Signed)
Patient is vol pending TOC placement 

## 2023-04-07 NOTE — ED Notes (Signed)
VOL/Pending TOC Placement

## 2023-04-07 NOTE — ED Notes (Signed)
Blood sugar 63. Pt given of orange juice and a lunch tray. 1200 insulin not given.

## 2023-04-07 NOTE — ED Provider Notes (Signed)
Emergency Medicine Observation Re-evaluation Note  Physical Exam   BP 120/76 (BP Location: Left Arm)   Pulse 99   Temp 98.3 F (36.8 C) (Oral)   Resp 17   Ht 4\' 2"  (1.27 m)   Wt 90 kg   SpO2 96%   BMI 55.80 kg/m   Patient appears in no acute distress.  ED Course / MDM   No reported events during my shift at the time of this note.   Pt is awaiting dispo from SW   Pilar Jarvis MD    Pilar Jarvis, MD 04/07/23 [phone]

## 2023-04-08 DIAGNOSIS — F209 Schizophrenia, unspecified: Secondary | ICD-10-CM | POA: Diagnosis not present

## 2023-04-08 LAB — CBG MONITORING, ED
Glucose-Capillary: 166 mg/dL — ABNORMAL HIGH (ref 70–99)
Glucose-Capillary: 178 mg/dL — ABNORMAL HIGH (ref 70–99)
Glucose-Capillary: 117 mg/dL — ABNORMAL HIGH (ref 70–99)
Glucose-Capillary: 133 mg/dL — ABNORMAL HIGH (ref 70–99)

## 2023-04-08 NOTE — ED Notes (Signed)
Lunch tray and water provided  

## 2023-04-08 NOTE — ED Notes (Signed)
Breakfast and water provided

## 2023-04-08 NOTE — ED Notes (Signed)
This NT provided pt with pm snack. 

## 2023-04-08 NOTE — ED Notes (Signed)
ENVIRONMENTAL ASSESSMENT Potentially harmful objects out of patient reach: Yes.   Personal belongings secured: Yes.   Patient dressed in hospital provided attire only: Yes.   Plastic bags out of patient reach: Yes.   Patient care equipment (cords, cables, call bells, lines, and drains) shortened, removed, or accounted for: Yes.   Equipment and supplies removed from bottom of stretcher: Yes.   Potentially toxic materials out of patient reach: Yes.   Sharps container removed or out of patient reach: Yes.    

## 2023-04-08 NOTE — ED Provider Notes (Signed)
BP 132/77 (BP Location: Left Arm)   Pulse 90   Temp 97.7 F (36.5 C) (Oral)   Resp 16   Ht 4\' 2"  (1.27 m)   Wt 90 kg   SpO2 97%   BMI 55.80 kg/m   NAD Awaiting dispo from SW   Willy Eddy, MD 04/08/23 0825

## 2023-04-08 NOTE — TOC Progression Note (Signed)
Transition of Care [facility]) - Progression Note    Patient Details  Name: Shandon Leys MRN: [id number] Date of Birth: [birth year]  Transition of Care [facility]) CM/SW Contact  Kreg Shropshire, RN Phone Number: 04/08/2023, 11:54 AM  Clinical Narrative:     Cm called sister Velvet to see if she was available to get Social Security paperwork turned in. LVM and sent secure message. Awaiting for callback.       Expected Discharge Plan and Services                                               Social Determinants of Health (SDOH) Interventions SDOH Screenings   Alcohol Screen: Low Risk  (05/16/2021)  Tobacco Use: Low Risk  (02/23/2023)    Readmission Risk Interventions     No data to display

## 2023-04-08 NOTE — ED Notes (Signed)
Dinner tray and water provided.

## 2023-04-09 DIAGNOSIS — F209 Schizophrenia, unspecified: Secondary | ICD-10-CM | POA: Diagnosis not present

## 2023-04-09 LAB — CBG MONITORING, ED
Glucose-Capillary: 131 mg/dL — ABNORMAL HIGH (ref 70–99)
Glucose-Capillary: 160 mg/dL — ABNORMAL HIGH (ref 70–99)
Glucose-Capillary: 71 mg/dL (ref 70–99)
Glucose-Capillary: 152 mg/dL — ABNORMAL HIGH (ref 70–99)
Glucose-Capillary: 120 mg/dL — ABNORMAL HIGH (ref 70–99)

## 2023-04-09 NOTE — ED Provider Notes (Signed)
Emergency Medicine Observation Re-evaluation Note  Larry Cook is a 62 y.o. male, currently boarding in the emergency department.  No acute events since last update.  Physical Exam  BP 121/73 (BP Location: Left Arm)   Pulse 75   Temp 97.9 F (36.6 C) (Oral)   Resp 17   Ht 4\' 2"  (1.27 m)   Wt 90 kg   SpO2 99%   BMI 55.80 kg/m   ED Course / MDM   No recent lab work available for review besides CBGs which appear well-controlled.  Plan  Current plan is for placement to an appropriate living facility once available.  Social worker is currently working with the patient to achieve this.Minna Antis, MD 04/09/23 [phone]

## 2023-04-09 NOTE — ED Notes (Addendum)
Patient received dinner tray and beverage, blood-sugar 71, he is asymptomatic , encouraged to eat dinner, He sat up to ingest meal , recheck blood sugar will be done.

## 2023-04-09 NOTE — ED Notes (Signed)
ENVIRONMENTAL ASSESSMENT Potentially harmful objects out of patient reach: Yes.   Personal belongings secured: Yes.   Patient dressed in hospital provided attire only: Yes.   Plastic bags out of patient reach: Yes.   Patient care equipment (cords, cables, call bells, lines, and drains) shortened, removed, or accounted for: Yes.   Equipment and supplies removed from bottom of stretcher: Yes.   Potentially toxic materials out of patient reach: Yes.   Sharps container removed or out of patient reach: Yes.    

## 2023-04-09 NOTE — ED Notes (Signed)
No behavioral issues to report during the shift.

## 2023-04-10 DIAGNOSIS — F209 Schizophrenia, unspecified: Secondary | ICD-10-CM | POA: Diagnosis not present

## 2023-04-10 LAB — CBG MONITORING, ED
Glucose-Capillary: 89 mg/dL (ref 70–99)
Glucose-Capillary: 148 mg/dL — ABNORMAL HIGH (ref 70–99)

## 2023-04-10 NOTE — ED Notes (Signed)
ENVIRONMENTAL ASSESSMENT Potentially harmful objects out of patient reach: Yes.   Personal belongings secured: Yes.   Patient dressed in hospital provided attire only: Yes.   Plastic bags out of patient reach: Yes.   Patient care equipment (cords, cables, call bells, lines, and drains) shortened, removed, or accounted for: Yes.   Equipment and supplies removed from bottom of stretcher: Yes.   Potentially toxic materials out of patient reach: Yes.   Sharps container removed or out of patient reach: Yes.    

## 2023-04-10 NOTE — TOC Progression Note (Signed)
Transition of Care [facility]) - Progression Note    Patient Details  Name: Larry Cook MRN: [id number] Date of Birth: 1961-07-03  Transition of Care [facility]) CM/SW Contact  Kreg Shropshire, RN Phone Number: 04/10/2023, 11:49 AM  Clinical Narrative:    Cm reached out to Jasmin with [facility] [email or web address] to see if she has any updates on pt status to come to group home        Expected Discharge Plan and Services                                               Social Determinants of Health (SDOH) Interventions SDOH Screenings   Alcohol Screen: Low Risk  (05/16/2021)  Tobacco Use: Low Risk  (02/23/2023)    Readmission Risk Interventions     No data to display

## 2023-04-10 NOTE — ED Notes (Signed)
Hospital meal provided, pt tolerated w/o complaints.  Waste discarded appropriately.  

## 2023-04-10 NOTE — ED Provider Notes (Signed)
Emergency Medicine Observation Re-evaluation Note  Physical Exam   BP 113/70   Pulse 88   Temp 97.8 F (36.6 C) (Oral)   Resp 17   Ht 4\' 2"  (1.27 m)   Wt 90 kg   SpO2 96%   BMI 55.80 kg/m   Patient appears in no acute distress.  ED Course / MDM   No reported events during my shift at the time of this note.   Pt is awaiting dispo from SW   Pilar Jarvis MD    Pilar Jarvis, MD 04/10/23 [phone]

## 2023-04-10 NOTE — ED Notes (Signed)
Snack and drink given 

## 2023-04-10 NOTE — ED Notes (Signed)
SNACKS GIVEN.

## 2023-04-11 DIAGNOSIS — F209 Schizophrenia, unspecified: Secondary | ICD-10-CM | POA: Diagnosis not present

## 2023-04-11 LAB — CBG MONITORING, ED
Glucose-Capillary: 124 mg/dL — ABNORMAL HIGH (ref 70–99)
Glucose-Capillary: 122 mg/dL — ABNORMAL HIGH (ref 70–99)
Glucose-Capillary: 97 mg/dL (ref 70–99)
Glucose-Capillary: 159 mg/dL — ABNORMAL HIGH (ref 70–99)

## 2023-04-11 NOTE — ED Notes (Signed)
Patient is pleasant, no signs of distress, no behavioral issues noted, will continue to monitor for safety.

## 2023-04-11 NOTE — ED Provider Notes (Signed)
-----------------------------------------   7:20 AM on 04/11/2023 -----------------------------------------   Blood pressure (!) 111/50, pulse 85, temperature 97.7 F (36.5 C), temperature source Oral, resp. rate 17, height 4\' 2"  (1.27 m), weight 90 kg, SpO2 94%.  The patient is calm and cooperative at this time.  There have been no acute events since the last update.  Awaiting disposition plan from [facility] team.   Janith Lima, MD 04/11/23 [phone]

## 2023-04-11 NOTE — ED Notes (Signed)
Fsbs 78

## 2023-04-11 NOTE — ED Notes (Signed)
No behavioral issues to report during shift.

## 2023-04-12 DIAGNOSIS — F209 Schizophrenia, unspecified: Secondary | ICD-10-CM | POA: Diagnosis not present

## 2023-04-12 LAB — CBG MONITORING, ED
Glucose-Capillary: 78 mg/dL (ref 70–99)
Glucose-Capillary: 135 mg/dL — ABNORMAL HIGH (ref 70–99)
Glucose-Capillary: 111 mg/dL — ABNORMAL HIGH (ref 70–99)
Glucose-Capillary: 117 mg/dL — ABNORMAL HIGH (ref 70–99)
Glucose-Capillary: 141 mg/dL — ABNORMAL HIGH (ref 70–99)
Glucose-Capillary: 122 mg/dL — ABNORMAL HIGH (ref 70–99)

## 2023-04-12 NOTE — ED Notes (Signed)
VOL/Pending TOC Placement

## 2023-04-12 NOTE — ED Notes (Signed)
Pt received dinner tray.

## 2023-04-12 NOTE — ED Notes (Signed)
Lunch tray given. 

## 2023-04-12 NOTE — ED Provider Notes (Signed)
Emergency Medicine Observation Re-evaluation Note  Larry Cook is a 62 y.o. male, seen on rounds today.  Pt initially presented to the ED for complaints of Psychiatric Evaluation (IVC) Currently, the patient is resting comfortably, no issues overnight..  Physical Exam  BP (!) 146/86 (BP Location: Left Arm)   Pulse 86   Temp 97.8 F (36.6 C) (Oral)   Resp 18   Ht 4\' 2"  (1.27 m)   Wt 90 kg   SpO2 97%   BMI 55.80 kg/m  Physical Exam Patient appears well, no acute distress, normal WOB    ED Course / MDM  EKG:EKG Interpretation Date/Time:  Saturday March 09 2023 11:13:43 EDT Ventricular Rate:  81 PR Interval:  192 QRS Duration:  152 QT Interval:  422 QTC Calculation: 490 R Axis:   -73  Text Interpretation: Normal sinus rhythm Right bundle branch block Left anterior fascicular block Bifascicular block Left ventricular hypertrophy with repolarization abnormality ( R in aVL , Romhilt-Estes ) Cannot rule out Septal infarct , age undetermined Abnormal ECG When compared with ECG of 26-Feb-2023 12:52, PREVIOUS ECG IS PRESENT Confirmed by UNCONFIRMED, DOCTOR ([id number]), editor Lonell Face [phone]) on 03/11/2023 7:17:16 AM  I have reviewed the labs performed to date as well as medications administered while in observation.  Recent changes in the last 24 hours include none.  Plan  Current plan is for dispo per social work.    Chesley Noon, MD 04/12/23 [phone]

## 2023-04-12 NOTE — ED Notes (Signed)
pt recieved snack and drink 

## 2023-04-12 NOTE — TOC Progression Note (Addendum)
Transition of Care [facility]) - Progression Note    Patient Details  Name: Elii Oscarson MRN: [id number] Date of Birth: 04-07-1961  Transition of Care [facility]) CM/SW Contact  Kreg Shropshire, RN Phone Number: 04/12/2023, 9:20 AM  Clinical Narrative:    Cm reached out to [facility] pt sister. She stated she still has not received the SS disability paperwork. The paperwork is needed for [facility] to verify income. Velvet stated she is going to call SS now to status of paperwork. Cm called Therapist, music of [facility], [phone] to inform. Jasmin stated that they also need  Special assistance Medicaid. Velvet will have to go down to the county DSS and apply for special assistance Medicaid. According to previous documented conversation with Velvet, pt receives over $900 a month from disability. Jasmin stated that pt needs $1400 for group home stay. The special assistance medicaid will assist the the additional income. When pt applies for special assistance Medicaid, she has to put [facility] on recipient and then put their phone number [phone].   Cm called sister Velvet back and informed her the steps in applying for the special assistance medicaid. Cm informed Velvet that she needs to do that ASAP.   Jasmin stated that they are going to work on putting things in motion on their end to speed up the process. They will also look into seeing if they can apply for special assistance Medicaid for pt.       Expected Discharge Plan and Services                                               Social Determinants of Health (SDOH) Interventions SDOH Screenings   Alcohol Screen: Low Risk  (05/16/2021)  Tobacco Use: Low Risk  (02/23/2023)    Readmission Risk Interventions     No data to display

## 2023-04-13 DIAGNOSIS — F209 Schizophrenia, unspecified: Secondary | ICD-10-CM | POA: Diagnosis not present

## 2023-04-13 LAB — CBG MONITORING, ED
Glucose-Capillary: 111 mg/dL — ABNORMAL HIGH (ref 70–99)
Glucose-Capillary: 179 mg/dL — ABNORMAL HIGH (ref 70–99)
Glucose-Capillary: 60 mg/dL — ABNORMAL LOW (ref 70–99)

## 2023-04-13 NOTE — ED Provider Notes (Signed)
Emergency Medicine Observation Re-evaluation Note  Larry Cook is a 62 y.o. male, seen on rounds today.  Pt initially presented to the ED for complaints of Psychiatric Evaluation (IVC)  Currently, the patient is resting in bed. No reported issues overnight from nursing team.   Physical Exam  BP 126/72 (BP Location: Right Arm)   Pulse 72   Temp 98.1 F (36.7 C) (Oral)   Resp 16   Ht 4\' 2"  (1.27 m)   Wt 90 kg   SpO2 99%   BMI 55.80 kg/m  Physical Exam General: Resting comfortably in bed  ED Course / MDM   BGL 111 to 141 over past 24 hours  Plan  Current plan is for dispo per social work.    Trinna Post, MD 04/13/23 [phone]

## 2023-04-13 NOTE — ED Notes (Signed)
During nursing assessment Larry Cook did not verbalize answers to assessment questions . He continues to isolate to self / room sleeping >20 hours a day. Pt affect is flat , eye contact is poor , patient does not speak. Medications were administered as ordered. Continue to monitor patient as ordered for any changes in behaviors and for continued safety.

## 2023-04-13 NOTE — ED Notes (Signed)
Hospital meal provided, pt tolerated w/o complaints.  Waste discarded appropriately.  

## 2023-04-14 DIAGNOSIS — F209 Schizophrenia, unspecified: Secondary | ICD-10-CM | POA: Diagnosis not present

## 2023-04-14 LAB — CBG MONITORING, ED
Glucose-Capillary: 96 mg/dL (ref 70–99)
Glucose-Capillary: 157 mg/dL — ABNORMAL HIGH (ref 70–99)
Glucose-Capillary: 77 mg/dL (ref 70–99)
Glucose-Capillary: 103 mg/dL — ABNORMAL HIGH (ref 70–99)

## 2023-04-14 NOTE — ED Notes (Signed)
Patient is vol pending TOC placement 

## 2023-04-14 NOTE — ED Notes (Signed)
Snack given.

## 2023-04-14 NOTE — ED Notes (Signed)
Hospital meal provided, pt tolerated w/o complaints.  Waste discarded appropriately.  

## 2023-04-14 NOTE — ED Notes (Signed)
Pt taking shower. Pt was given hygiene items and the following, 1 clean top, 1 clean bottom, with 1 pair of disposable underwear.  Pt changed out into clean clothing.  Staff disposed of all shower supplies.   

## 2023-04-15 DIAGNOSIS — F209 Schizophrenia, unspecified: Secondary | ICD-10-CM | POA: Diagnosis not present

## 2023-04-15 LAB — CBG MONITORING, ED
Glucose-Capillary: 116 mg/dL — ABNORMAL HIGH (ref 70–99)
Glucose-Capillary: 121 mg/dL — ABNORMAL HIGH (ref 70–99)
Glucose-Capillary: 115 mg/dL — ABNORMAL HIGH (ref 70–99)
Glucose-Capillary: 152 mg/dL — ABNORMAL HIGH (ref 70–99)
Glucose-Capillary: 122 mg/dL — ABNORMAL HIGH (ref 70–99)

## 2023-04-15 NOTE — ED Notes (Signed)
Hospital meal provided, pt tolerated w/o complaints.  Waste discarded appropriately.  

## 2023-04-15 NOTE — ED Provider Notes (Signed)
Emergency Medicine Observation Re-evaluation Note  Larry Cook is a 62 y.o. male, seen on rounds today.  Pt initially presented to the ED for complaints of Psychiatric Evaluation (IVC) Currently, the patient is awaiting placement.  Physical Exam  BP 136/87 (BP Location: Right Arm)   Pulse 88   Temp 98 F (36.7 C) (Oral)   Resp 16   Ht 4\' 2"  (1.27 m)   Wt 90 kg   SpO2 97%   BMI 55.80 kg/m  Physical Exam General: Calm  ED Course / MDM   Blood sugar slightly elevated  Plan  Current plan is for awaiting placement.    Phineas Semen, MD 04/15/23 [phone]

## 2023-04-16 DIAGNOSIS — F209 Schizophrenia, unspecified: Secondary | ICD-10-CM | POA: Diagnosis not present

## 2023-04-16 LAB — CBG MONITORING, ED
Glucose-Capillary: 132 mg/dL — ABNORMAL HIGH (ref 70–99)
Glucose-Capillary: 145 mg/dL — ABNORMAL HIGH (ref 70–99)
Glucose-Capillary: 182 mg/dL — ABNORMAL HIGH (ref 70–99)
Glucose-Capillary: 122 mg/dL — ABNORMAL HIGH (ref 70–99)

## 2023-04-16 NOTE — ED Notes (Signed)
VOL/TOC Placement 

## 2023-04-16 NOTE — TOC Progression Note (Signed)
Transition of Care [facility]) - Progression Note    Patient Details  Name: Larry Cook MRN: [id number] Date of Birth: 27-Aug-1960  Transition [facility] Care [facility]) CM/SW Contact  Kreg Shropshire, RN Phone Number: 04/16/2023, 8:53 AM  Clinical Narrative:     Cm received call from Sister/legal guardian stating that she has the disability paperwork. Cm informed Sister to fax over disability paperwork to Group Home administration Fax: [phone]. Sister also stated that pt has group home coverage with [employer]. Cm called Psychiatric nurse at [facility] group home to inform her about sister faxing over information and [employer] group home coverage. Jasmin stated she is going to call sister/legal guardian to get more information. Cm expressed the urgency of getting pt placed ASAP. Jasmin stated she is going to call me back today.       Expected Discharge Plan and Services                                               Social Determinants of Health (SDOH) Interventions SDOH Screenings   Alcohol Screen: Low Risk  (05/16/2021)  Tobacco Use: Low Risk  (02/23/2023)    Readmission Risk Interventions     No data to display

## 2023-04-16 NOTE — ED Notes (Signed)
 Pt given pm snack and beverage.

## 2023-04-16 NOTE — ED Notes (Signed)
Patient ate 50% of lunch and beverage, He is calm and cooperative.

## 2023-04-16 NOTE — ED Provider Notes (Signed)
Emergency Medicine Observation Re-evaluation Note  Larry Cook is a 62 y.o. male, seen on rounds today.  Pt initially presented to the ED for complaints of Psychiatric Evaluation (IVC)  Currently, the patient is resting comfortably.  Physical Exam  BP (!) 147/97 (BP Location: Left Arm)   Pulse 83   Temp 98.2 F (36.8 C) (Oral)   Resp 18   Ht 4\' 2"  (1.27 m)   Wt 90 kg   SpO2 96%   BMI 55.80 kg/m  General: No acute distress Cardiac: Well-perfused extremities Lungs: No respiratory distress Psych: Appropriate mood and affect  ED Course / MDM  EKG:  I have reviewed the labs performed to date as well as medications administered while in observation.  Recent changes in the last 24 hours include none.  Plan  Current plan is for placement.   Merwyn Katos, MD 04/16/23 [phone]

## 2023-04-16 NOTE — ED Provider Notes (Signed)
Emergency Medicine Observation Re-evaluation Note  Physical Exam   BP 132/85   Pulse 98   Temp 97.7 F (36.5 C) (Oral)   Resp 18   Ht 4\' 2"  (1.27 m)   Wt 90 kg   SpO2 99%   BMI 55.80 kg/m   Patient appears in no acute distress.  ED Course / MDM   No reported events during my shift at the time of this note.   Pt is awaiting dispo from SW   Pilar Jarvis MD    Pilar Jarvis, MD 04/16/23 [phone]

## 2023-04-17 DIAGNOSIS — F209 Schizophrenia, unspecified: Secondary | ICD-10-CM | POA: Diagnosis not present

## 2023-04-17 LAB — CBG MONITORING, ED
Glucose-Capillary: 118 mg/dL — ABNORMAL HIGH (ref 70–99)
Glucose-Capillary: 98 mg/dL (ref 70–99)
Glucose-Capillary: 99 mg/dL (ref 70–99)
Glucose-Capillary: 81 mg/dL (ref 70–99)

## 2023-04-17 NOTE — ED Provider Notes (Signed)
-----------------------------------------   7:10 AM on 04/17/2023 -----------------------------------------   Blood pressure 118/73, pulse 84, temperature 98.4 F (36.9 C), temperature source Oral, resp. rate 17, height 4\' 2"  (1.27 m), weight 90 kg, SpO2 96%.  The patient is calm and cooperative at this time.  There have been no acute events since the last update.  Awaiting disposition plan from [facility] team.   Janith Lima, MD 04/17/23 [phone]

## 2023-04-17 NOTE — ED Notes (Signed)
VOL/TOC Placement Pending

## 2023-04-17 NOTE — ED Provider Notes (Signed)
Emergency Medicine Observation Re-evaluation Note  Physical Exam   BP 118/73 (BP Location: Left Arm)   Pulse 84   Temp 98.4 F (36.9 C) (Oral)   Resp 17   Ht 4\' 2"  (1.27 m)   Wt 90 kg   SpO2 96%   BMI 55.80 kg/m   Patient appears in no acute distress.  ED Course / MDM   No reported events during my shift at the time of this note.   Pt is awaiting dispo from SW   Pilar Jarvis MD    Pilar Jarvis, MD 04/17/23 [phone]

## 2023-04-17 NOTE — ED Notes (Signed)
Pt slept through snack but this NT left pt snack and water.

## 2023-04-17 NOTE — ED Notes (Signed)
Pt provide dinner tray and beverage.

## 2023-04-17 NOTE — ED Notes (Signed)
Pt is currently sleeping, dinner will be provided when he is awake.

## 2023-04-18 DIAGNOSIS — F209 Schizophrenia, unspecified: Secondary | ICD-10-CM | POA: Diagnosis not present

## 2023-04-18 LAB — CBG MONITORING, ED
Glucose-Capillary: 209 mg/dL — ABNORMAL HIGH (ref 70–99)
Glucose-Capillary: 169 mg/dL — ABNORMAL HIGH (ref 70–99)
Glucose-Capillary: 72 mg/dL (ref 70–99)
Glucose-Capillary: 92 mg/dL (ref 70–99)

## 2023-04-18 NOTE — TOC Progression Note (Addendum)
Transition of Care [facility]) - Progression Note    Patient Details  Name: Larry Cook MRN: [id number] Date of Birth: 04-Aug-1961  Transition [facility] Care [facility]) CM/SW Contact  Kreg Shropshire, RN Phone Number: 04/18/2023, 2:35 PM  Clinical Narrative:    Cm received email from [facility] Administrative Coordinator with [facility] in [facility] [email or web address], stating the following:  "I've spoken to the QP and the house manager, and they had some questions regarding recent aggressive behavior and the multiple times he has been admitted for being aggressive. Her concern is whether he has been given the correct balance of medications to prevent these incidents from happening so often. I think it's primarily the aggressive behavior that concerns them. She also wants to know the level of assistance he will need for bathing and getting dressed."  Cm messaged RN Toniann Fail to ask. She stated that pt has been humble and sweet. No aggressions whatsoever. Cm sent email back and called Jasmin. Cm expressed the urgency of getting pt placed. She stated she will call me back by 5pm.  1622- Jasmin called back to ask if pt can give himself insulin and check his own sugar. That will determine the appropriate home for him. Cm asked rn to give cm a call. Nurse stated that pt does not have the intellectual ability to check his sugar, read findings and give himself insulin. Cm called jasmin back and informed her. She stated he will go to another location that will be able to have to staff to help with medication. She will send email with the location.       Expected Discharge Plan and Services                                               Social Determinants of Health (SDOH) Interventions SDOH Screenings   Alcohol Screen: Low Risk  (05/16/2021)  Tobacco Use: Low Risk  (02/23/2023)    Readmission Risk Interventions     No data to display

## 2023-04-18 NOTE — ED Notes (Signed)
Dinner tray provided

## 2023-04-18 NOTE — ED Notes (Signed)
Pt up to bathroom , now sitting in day room

## 2023-04-18 NOTE — ED Provider Notes (Signed)
Emergency Medicine Observation Re-evaluation Note  Larry Cook is a 62 y.o. male, seen on rounds today.  Pt initially presented to the ED for complaints of Psychiatric Evaluation (IVC) Currently, the patient is resting comfortably.  Physical Exam  BP 127/78 (BP Location: Left Arm)   Pulse 90   Temp 97.8 F (36.6 C) (Oral)   Resp 18   Ht 4\' 2"  (1.27 m)   Wt 90 kg   SpO2 99%   BMI 55.80 kg/m  Physical Exam General: No acute distress  ED Course / MDM   I have reviewed the labs performed to date as well as medications administered while in observation.  There have been no significant changes in the last 24 hours.  Plan  Current plan is for TOC placement.    Dionne Bucy, MD 04/18/23 1035

## 2023-04-18 NOTE — ED Notes (Signed)
Patient ate 75% of lunch and beverage.

## 2023-04-18 NOTE — ED Notes (Signed)
VOL/toc placement 

## 2023-04-19 DIAGNOSIS — F209 Schizophrenia, unspecified: Secondary | ICD-10-CM | POA: Diagnosis not present

## 2023-04-19 LAB — CBG MONITORING, ED
Glucose-Capillary: 138 mg/dL — ABNORMAL HIGH (ref 70–99)
Glucose-Capillary: 182 mg/dL — ABNORMAL HIGH (ref 70–99)
Glucose-Capillary: 98 mg/dL (ref 70–99)
Glucose-Capillary: 144 mg/dL — ABNORMAL HIGH (ref 70–99)

## 2023-04-19 NOTE — ED Notes (Signed)
Patient ate 75% of breakfast and beverage, no signs of distress, Blood sugar was obtained and He received insulin as ordered.

## 2023-04-19 NOTE — TOC Progression Note (Signed)
Transition of Care [facility]) - Progression Note    Patient Details  Name: Khylin Kittler MRN: [id number] Date of Birth: 12/25/1960  Transition of Care [facility]) CM/SW Contact  Kreg Shropshire, RN Phone Number: 04/19/2023, 10:58 AM  Clinical Narrative:     Cm sent secure message to Jasmin with administration of [facility] to get details on pick pick up date [email or web address]. Awaiting to hear back       Expected Discharge Plan and Services                                               Social Determinants of Health (SDOH) Interventions SDOH Screenings   Alcohol Screen: Low Risk  (05/16/2021)  Tobacco Use: Low Risk  (02/23/2023)    Readmission Risk Interventions     No data to display

## 2023-04-19 NOTE — ED Notes (Signed)
Patient sitting in the dayroom, no signs of distress, staff will continue to monitor for safety, no behavioral issues noted.

## 2023-04-19 NOTE — ED Provider Notes (Signed)
Emergency Medicine Observation Re-evaluation Note  Larry Cook is a 63 y.o. male, seen on rounds today.  Pt initially presented to the ED for complaints of Psychiatric Evaluation (IVC)    Physical Exam  BP (!) 144/79 (BP Location: Left Arm)   Pulse 62   Temp 98 F (36.7 C) (Oral)   Resp 16   Ht 4\' 2"  (1.27 m)   Wt 90 kg   SpO2 97%   BMI 55.80 kg/m  Physical Exam General: nad  ED Course / MDM  EKG:  I have reviewed the labs performed to date as well as medications administered while in observation.  Recent changes in the last 24 hours include  .  Plan  Current plan is for psyc/soc.    Willy Eddy, MD 04/19/23 [phone]

## 2023-04-19 NOTE — ED Notes (Signed)
Dinner tray provided

## 2023-04-20 DIAGNOSIS — F209 Schizophrenia, unspecified: Secondary | ICD-10-CM | POA: Diagnosis not present

## 2023-04-20 LAB — CBG MONITORING, ED
Glucose-Capillary: 180 mg/dL — ABNORMAL HIGH (ref 70–99)
Glucose-Capillary: 122 mg/dL — ABNORMAL HIGH (ref 70–99)
Glucose-Capillary: 112 mg/dL — ABNORMAL HIGH (ref 70–99)
Glucose-Capillary: 162 mg/dL — ABNORMAL HIGH (ref 70–99)

## 2023-04-20 NOTE — ED Notes (Signed)
vol/toc.... 

## 2023-04-20 NOTE — TOC CM/SW Note (Signed)
CSW left vm for group home for update.

## 2023-04-20 NOTE — ED Notes (Signed)
Breakfast tray with milk provided. Pt sitting up in bed eating breakfast.

## 2023-04-20 NOTE — ED Notes (Signed)
Hospital meal provided, pt tolerated w/o complaints.  Waste discarded appropriately.  

## 2023-04-20 NOTE — ED Notes (Signed)
Pt received snack and drink 

## 2023-04-20 NOTE — ED Notes (Signed)
Pt received lunch 

## 2023-04-20 NOTE — ED Provider Notes (Signed)
Emergency Medicine Observation Re-evaluation Note  Larry Cook is a 62 y.o. male, seen on rounds today.  Pt initially presented to the ED for complaints of Psychiatric Evaluation (IVC)    Physical Exam  BP 123/60 (BP Location: Right Arm)   Pulse 68   Temp (!) 97.1 F (36.2 C) (Axillary)   Resp 16   Ht 4\' 2"  (1.27 m)   Wt 90 kg   SpO2 93%   BMI 55.80 kg/m  Physical Exam General: nad  ED Course / MDM  EKG:  I have reviewed the labs performed to date as well as medications administered while in observation.     Plan  Current plan is for psych/cm    Willy Eddy, MD 04/20/23 [phone]

## 2023-04-21 DIAGNOSIS — F209 Schizophrenia, unspecified: Secondary | ICD-10-CM | POA: Diagnosis not present

## 2023-04-21 LAB — CBG MONITORING, ED
Glucose-Capillary: 145 mg/dL — ABNORMAL HIGH (ref 70–99)
Glucose-Capillary: 155 mg/dL — ABNORMAL HIGH (ref 70–99)
Glucose-Capillary: 93 mg/dL (ref 70–99)
Glucose-Capillary: 158 mg/dL — ABNORMAL HIGH (ref 70–99)

## 2023-04-21 NOTE — TOC CM/SW Note (Addendum)
CSW spoke with [facility] regarding DC, I advised we are awaiting update and he will be ready to go, she advised she needs some time to confirm if there is someone that can care for him since he is diabetic and the home and requested a call back in an hour.  415PM: Jasmine called back and advised they won't be able to accept him due to the level of medical care that he would need and they sent a letter to [facility]

## 2023-04-21 NOTE — ED Notes (Signed)
ENVIRONMENTAL ASSESSMENT Potentially harmful objects out of patient reach: Yes.   Personal belongings secured: Yes.   Patient dressed in hospital provided attire only: Yes.   Plastic bags out of patient reach: Yes.   Patient care equipment (cords, cables, call bells, lines, and drains) shortened, removed, or accounted for: Yes.   Equipment and supplies removed from bottom of stretcher: Yes.   Potentially toxic materials out of patient reach: Yes.   Sharps container removed or out of patient reach: Yes.    

## 2023-04-21 NOTE — ED Provider Notes (Signed)
Emergency Medicine Observation Re-evaluation Note  Larry Cook is a 62 y.o. male, seen on rounds today.  Pt initially presented to the ED for complaints of Psychiatric Evaluation (IVC)    Physical Exam  BP 121/82 (BP Location: Left Arm)   Pulse 87   Temp 97.7 F (36.5 C) (Oral)   Resp 17   Ht 4\' 2"  (1.27 m)   Wt 90 kg   SpO2 100%   BMI 55.80 kg/m  Physical Exam General: nad  ED Course / MDM  EKG:  I have reviewed the labs performed to date as well as medications administered while in observation.    Plan  Current plan is for psych.cm.    Willy Eddy, MD 04/21/23 [phone]

## 2023-04-21 NOTE — ED Notes (Signed)
Hospital meal provided, pt tolerated w/o complaints.  Waste discarded appropriately.  

## 2023-04-22 DIAGNOSIS — F209 Schizophrenia, unspecified: Secondary | ICD-10-CM | POA: Diagnosis not present

## 2023-04-22 LAB — CBG MONITORING, ED
Glucose-Capillary: 100 mg/dL — ABNORMAL HIGH (ref 70–99)
Glucose-Capillary: 228 mg/dL — ABNORMAL HIGH (ref 70–99)
Glucose-Capillary: 81 mg/dL (ref 70–99)
Glucose-Capillary: 151 mg/dL — ABNORMAL HIGH (ref 70–99)

## 2023-04-22 NOTE — ED Notes (Signed)
Pt awoken to eat his dinner. Assisted pt to sitting position and handed him his fruit and showed him his meal.

## 2023-04-22 NOTE — ED Notes (Signed)
vol/toc.... 

## 2023-04-22 NOTE — ED Provider Notes (Signed)
Emergency Medicine Observation Re-evaluation Note  Physical Exam   BP (!) 145/98 (BP Location: Left Arm)   Pulse 81   Temp 97.9 F (36.6 C) (Oral)   Resp 18   Ht 4\' 2"  (1.27 m)   Wt 90 kg   SpO2 100%   BMI 55.80 kg/m   Patient appears in no acute distress.  ED Course / MDM   No reported events during my shift at the time of this note.   Pt is awaiting dispo from SW   Pilar Jarvis MD    Pilar Jarvis, MD 04/22/23 [phone]

## 2023-04-23 DIAGNOSIS — F209 Schizophrenia, unspecified: Secondary | ICD-10-CM | POA: Diagnosis not present

## 2023-04-23 LAB — CBG MONITORING, ED
Glucose-Capillary: 124 mg/dL — ABNORMAL HIGH (ref 70–99)
Glucose-Capillary: 158 mg/dL — ABNORMAL HIGH (ref 70–99)
Glucose-Capillary: 141 mg/dL — ABNORMAL HIGH (ref 70–99)
Glucose-Capillary: 169 mg/dL — ABNORMAL HIGH (ref 70–99)

## 2023-04-23 NOTE — ED Notes (Signed)
Hospital meal provided, pt tolerated w/o complaints.  Waste discarded appropriately.  

## 2023-04-23 NOTE — ED Provider Notes (Signed)
Emergency Medicine Observation Re-evaluation Note  Larry Cook is a 62 y.o. male, seen on rounds today.  Pt initially presented to the ED for complaints of Psychiatric Evaluation (IVC) Currently, the patient is resting comfortably.  Physical Exam  BP (!) 149/89   Pulse 88   Temp 97.7 F (36.5 C) (Oral)   Resp 18   Ht 4\' 2"  (1.27 m)   Wt 90 kg   SpO2 99%   BMI 55.80 kg/m  Physical Exam General: No acute distress Cardiac: No cyanosis Lungs: Normal work of breathing Psych: Calm and cooperative  ED Course / MDM  EKG:EKG Interpretation Date/Time:  Saturday March 09 2023 11:13:43 EDT Ventricular Rate:  81 PR Interval:  192 QRS Duration:  152 QT Interval:  422 QTC Calculation: 490 R Axis:   -73  Text Interpretation: Normal sinus rhythm Right bundle branch block Left anterior fascicular block  Bifascicular block  Left ventricular hypertrophy with repolarization abnormality ( R in aVL , Romhilt-Estes ) Cannot rule out Septal infarct , age undetermined Abnormal ECG When compared with ECG of 26-Feb-2023 12:52, PREVIOUS ECG IS PRESENT Confirmed by UNCONFIRMED, DOCTOR ([id number]), editor Lonell Face [phone]) on 03/11/2023 7:17:16 AM  I have reviewed the labs performed to date as well as medications administered while in observation.  Recent changes in the last 24 hours include no acute events.  Plan  Current plan is for pending social work.    Janith Lima, MD 04/23/23 [phone]

## 2023-04-23 NOTE — TOC Progression Note (Signed)
Transition of Care [facility]) - Progression Note    Patient Details  Name: Larry Cook MRN: [id number] Date of Birth: 01-02-1961  [facility] Care [facility]) CM/SW Contact  Darolyn Rua, [place] Phone Number: 04/23/2023, 10:35 AM  Clinical Narrative:     CSW lvm with Iline Oven with [facility]  who is working on placement- pending call back with updates on placement.   [address] [place] [address] O:: [phone] Fax: [phone] M:: [phone]        Expected Discharge Plan and Services                                               Social Determinants of Health (SDOH) Interventions SDOH Screenings   Alcohol Screen: Low Risk  (05/16/2021)  Tobacco Use: Low Risk  (02/23/2023)    Readmission Risk Interventions     No data to display

## 2023-04-23 NOTE — ED Notes (Signed)
 Pt provided pm snack and beverage.

## 2023-04-23 NOTE — ED Notes (Signed)
Pt provided with dinner tray and unsweet tea. Pt sitting up in bed eating meal.

## 2023-04-23 NOTE — ED Notes (Signed)
No behavioral issues to report during shift.    ENVIRONMENTAL ASSESSMENT Potentially harmful objects out of patient reach: Yes.   Personal belongings secured: Yes.   Patient dressed in hospital provided attire only: Yes.   Plastic bags out of patient reach: Yes.   Patient care equipment (cords, cables, call bells, lines, and drains) shortened, removed, or accounted for: Yes.   Equipment and supplies removed from bottom of stretcher: Yes.   Potentially toxic materials out of patient reach: Yes.   Sharps container removed or out of patient reach: Yes.

## 2023-04-24 DIAGNOSIS — F209 Schizophrenia, unspecified: Secondary | ICD-10-CM | POA: Diagnosis not present

## 2023-04-24 LAB — CBG MONITORING, ED
Glucose-Capillary: 132 mg/dL — ABNORMAL HIGH (ref 70–99)
Glucose-Capillary: 165 mg/dL — ABNORMAL HIGH (ref 70–99)
Glucose-Capillary: 75 mg/dL (ref 70–99)
Glucose-Capillary: 144 mg/dL — ABNORMAL HIGH (ref 70–99)

## 2023-04-24 NOTE — ED Notes (Signed)
Pt received snack at bedside, trash was removed from room

## 2023-04-24 NOTE — ED Notes (Signed)
Patient ate 100% of dinner and beverage, no signs of distress, staff will continue to monitor for safety.

## 2023-04-24 NOTE — ED Provider Notes (Signed)
Emergency Medicine Observation Re-evaluation Note  Larry Cook is a 62 y.o. male, seen on rounds today.  Pt initially presented to the ED for complaints of Psychiatric Evaluation (IVC) Currently, the patient is awaiting social work dispo.  Physical Exam  BP 121/76 (BP Location: Right Arm)   Pulse 72   Temp 98.2 F (36.8 C)   Resp 18   Ht 4\' 2"  (1.27 m)   Wt 90 kg   SpO2 99%   BMI 55.80 kg/m  Physical Exam General: resting calmly  ED Course / MDM  Blood sugar slightly elevated yesterday   Plan  Current plan is for social work dispo.    Phineas Semen, MD 04/24/23 [phone]

## 2023-04-24 NOTE — ED Notes (Signed)
Pt was given lunch on safe tray and water.

## 2023-04-24 NOTE — ED Notes (Signed)
Patient eating His lunch and He is calm and cooperative, no signs of distress, no behavioral issues noted, will continue to monitor for safety.

## 2023-04-25 DIAGNOSIS — F209 Schizophrenia, unspecified: Secondary | ICD-10-CM | POA: Diagnosis not present

## 2023-04-25 LAB — CBG MONITORING, ED
Glucose-Capillary: 127 mg/dL — ABNORMAL HIGH (ref 70–99)
Glucose-Capillary: 305 mg/dL — ABNORMAL HIGH (ref 70–99)
Glucose-Capillary: 64 mg/dL — ABNORMAL LOW (ref 70–99)
Glucose-Capillary: 86 mg/dL (ref 70–99)
Glucose-Capillary: 122 mg/dL — ABNORMAL HIGH (ref 70–99)
Glucose-Capillary: 148 mg/dL — ABNORMAL HIGH (ref 70–99)

## 2023-04-25 NOTE — ED Notes (Signed)
ENVIRONMENTAL ASSESSMENT Potentially harmful objects out of patient reach: Yes.   Personal belongings secured: Yes.   Patient dressed in hospital provided attire only: Yes.   Plastic bags out of patient reach: Yes.   Patient care equipment (cords, cables, call bells, lines, and drains) shortened, removed, or accounted for: Yes.   Equipment and supplies removed from bottom of stretcher: Yes.   Potentially toxic materials out of patient reach: Yes.   Sharps container removed or out of patient reach: Yes.    

## 2023-04-25 NOTE — ED Notes (Signed)
PT provided evening snack

## 2023-04-25 NOTE — ED Provider Notes (Signed)
Emergency Medicine Observation Re-evaluation Note  Anderson Lepera is a 62 y.o. male, seen on rounds today.  Pt initially presented to the ED for complaints of Psychiatric Evaluation (IVC) Currently, the patient is resting comfortably.  Physical Exam  BP (!) 116/57 (BP Location: Right Arm)   Pulse 80   Temp 98.1 F (36.7 C) (Oral)   Resp 16   Ht 4\' 2"  (1.27 m)   Wt 90 kg   SpO2 98%   BMI 55.80 kg/m  Physical Exam General: No acute distress Cardiac: No cyanosis Lungs: Normal work of breathing Psych: Calm and cooperative  ED Course / MDM  EKG:EKG Interpretation Date/Time:  Saturday March 09 2023 11:13:43 EDT Ventricular Rate:  81 PR Interval:  192 QRS Duration:  152 QT Interval:  422 QTC Calculation: 490 R Axis:   -73  Text Interpretation: Normal sinus rhythm Right bundle branch block Left anterior fascicular block  Bifascicular block  Left ventricular hypertrophy with repolarization abnormality ( R in aVL , Romhilt-Estes ) Cannot rule out Septal infarct , age undetermined Abnormal ECG When compared with ECG of 26-Feb-2023 12:52, PREVIOUS ECG IS PRESENT Confirmed by UNCONFIRMED, DOCTOR ([id number]), editor Lonell Face [phone]) on 03/11/2023 7:17:16 AM  I have reviewed the labs performed to date as well as medications administered while in observation.  Recent changes in the last 24 hours include no acute events.  Plan  Current plan is for social work dispo.    Janith Lima, MD 04/25/23 [phone]

## 2023-04-25 NOTE — ED Notes (Signed)
Pt given orange juice.

## 2023-04-25 NOTE — ED Notes (Signed)
vol/toc.... 

## 2023-04-26 DIAGNOSIS — F209 Schizophrenia, unspecified: Secondary | ICD-10-CM | POA: Diagnosis not present

## 2023-04-26 LAB — CBG MONITORING, ED
Glucose-Capillary: 113 mg/dL — ABNORMAL HIGH (ref 70–99)
Glucose-Capillary: 143 mg/dL — ABNORMAL HIGH (ref 70–99)
Glucose-Capillary: 117 mg/dL — ABNORMAL HIGH (ref 70–99)
Glucose-Capillary: 88 mg/dL (ref 70–99)

## 2023-04-26 NOTE — ED Notes (Signed)
Mr Larry Cook did not want to answer daily assessment questions.  He continues to be calm, cooperative and compliant with medications. Cont to monitor as ordered

## 2023-04-26 NOTE — TOC Progression Note (Addendum)
Transition of Care [facility]) - Progression Note    Patient Details  Name: Larry Cook MRN: [id number] Date of Birth: 07-08-1961  Transition of Care [facility]) CM/SW Contact  Darolyn Rua, [place] Phone Number: 04/26/2023, 12:49 PM  Clinical Narrative:     Update 2:48: French Ana emailed back informing CSW that hospital needs to call and fax updated clinicals to a [facility] , she provided contact information that is a non working number. CSW notes past RNCM notes indicate this facility was unable to accept patient due ot his medical needs, csw has emailed French Ana back inquiring as to updated contact information and inquiring if facility is reconsidering their offer? Pending response.    CSW has Materials [email or web address] To inquire on any new updates for placement options, pending response. TOC supervisor aware.        Expected Discharge Plan and Services                                               Social Determinants of Health (SDOH) Interventions SDOH Screenings   Alcohol Screen: Low Risk  (05/16/2021)  Tobacco Use: Low Risk  (02/23/2023)    Readmission Risk Interventions     No data to display

## 2023-04-26 NOTE — ED Notes (Signed)
VOL/Still pending TOC Placement

## 2023-04-26 NOTE — ED Provider Notes (Signed)
Emergency Medicine Observation Re-evaluation Note  Larry Cook is a 62 y.o. male, seen on rounds today.  Pt initially presented to the ED for complaints of Psychiatric Evaluation (IVC)  Currently, the patient is resting in bed. No reported issues from nursing team.   Physical Exam  BP 133/82 (BP Location: Left Arm)   Pulse 98   Temp 98.1 F (36.7 C) (Oral)   Resp 18   Ht 4\' 2"  (1.27 m)   Wt 90 kg   SpO2 98%   BMI 55.80 kg/m  Physical Exam General: Resting comfortably in bed  ED Course / MDM  BGL 64-305 over the last 24 hours  Plan  Current plan is for dispo per social work.    Trinna Post, MD 04/26/23 0830

## 2023-04-26 NOTE — ED Notes (Signed)
Snack and drink given 

## 2023-04-26 NOTE — ED Notes (Signed)
Dinner tray provided

## 2023-04-27 DIAGNOSIS — F209 Schizophrenia, unspecified: Secondary | ICD-10-CM | POA: Diagnosis not present

## 2023-04-27 LAB — CBG MONITORING, ED
Glucose-Capillary: 129 mg/dL — ABNORMAL HIGH (ref 70–99)
Glucose-Capillary: 215 mg/dL — ABNORMAL HIGH (ref 70–99)
Glucose-Capillary: 144 mg/dL — ABNORMAL HIGH (ref 70–99)
Glucose-Capillary: 127 mg/dL — ABNORMAL HIGH (ref 70–99)

## 2023-04-27 MED ORDER — IBUPROFEN 600 MG PO TABS
600.0000 mg | ORAL_TABLET | Freq: Three times a day (TID) | ORAL | Status: DC
  Administered 2023-04-27 – 2023-05-08 (×29): 600 mg via ORAL
  Filled 2023-04-27 (×31): qty 1

## 2023-04-27 NOTE — ED Notes (Signed)
TOC

## 2023-04-27 NOTE — ED Notes (Signed)
Patient is pleasant, no signs of distress, ate 100% of breakfast and beverage, and went to dayroom, interacting with other Patients, shook their hand. He is friendly and calm, staff will continue to monitor for safety.

## 2023-04-27 NOTE — ED Notes (Signed)
Patient received snack.  

## 2023-04-27 NOTE — ED Provider Notes (Signed)
Emergency Medicine Observation Re-evaluation Note  Larry Cook is a 62 y.o. male, seen on rounds today.  Pt initially presented to the ED for complaints of Psychiatric Evaluation (IVC)    Physical Exam  BP (!) 112/54   Pulse 82   Temp 98.4 F (36.9 C) (Oral)   Resp 16   Ht 4\' 2"  (1.27 m)   Wt 90 kg   SpO2 95%   BMI 55.80 kg/m  Physical Exam General: nad  ED Course / MDM  EKG:  I have reviewed the labs performed to date as well as medications administered while in observation.     Plan  Current plan is for soc.    Willy Eddy, MD 04/27/23 [phone]

## 2023-04-27 NOTE — ED Notes (Signed)
Patient up to the bathroom, no signs of distress. 

## 2023-04-28 DIAGNOSIS — F209 Schizophrenia, unspecified: Secondary | ICD-10-CM | POA: Diagnosis not present

## 2023-04-28 LAB — CBG MONITORING, ED
Glucose-Capillary: 94 mg/dL (ref 70–99)
Glucose-Capillary: 143 mg/dL — ABNORMAL HIGH (ref 70–99)
Glucose-Capillary: 76 mg/dL (ref 70–99)
Glucose-Capillary: 103 mg/dL — ABNORMAL HIGH (ref 70–99)

## 2023-04-28 NOTE — ED Notes (Signed)
Breakfast tray and water provided

## 2023-04-28 NOTE — ED Provider Notes (Signed)
Emergency Medicine Observation Re-evaluation Note  Carney Mercadel is a 62 y.o. male, seen on rounds today.  Pt initially presented to the ED for complaints of Psychiatric Evaluation (IVC) Currently, the patient is calm and cooperative.  Physical Exam  BP 132/89 (BP Location: Right Arm)   Pulse 94   Temp 98 F (36.7 C) (Oral)   Resp 18   Ht 4\' 2"  (1.27 m)   Wt 90 kg   SpO2 99%   BMI 55.80 kg/m  Physical Exam General: No acute distress Cardiac: No cyanosis Lungs: Normal work of breathing Psych: Calm and cooperative  ED Course / MDM    I have reviewed the labs performed to date as well as medications administered while in observation.  Recent changes in the last 24 hours include no acute events.  Plan  Current plan is for social work disposition.    Janith Lima, MD 04/28/23 [phone]

## 2023-04-28 NOTE — ED Notes (Signed)
Hospital meal provided, pt tolerated w/o complaints.  Waste discarded appropriately.  

## 2023-04-29 DIAGNOSIS — F209 Schizophrenia, unspecified: Secondary | ICD-10-CM | POA: Diagnosis not present

## 2023-04-29 LAB — CBG MONITORING, ED
Glucose-Capillary: 109 mg/dL — ABNORMAL HIGH (ref 70–99)
Glucose-Capillary: 158 mg/dL — ABNORMAL HIGH (ref 70–99)
Glucose-Capillary: 112 mg/dL — ABNORMAL HIGH (ref 70–99)
Glucose-Capillary: 96 mg/dL (ref 70–99)

## 2023-04-29 NOTE — TOC Progression Note (Signed)
Transition of Care [facility]) - Progression Note    Patient Details  Name: Larry Cook MRN: [id number] Date of Birth: 1960/09/26  Transition of Care [facility]) CM/SW Contact  Margarito Liner, LCSW Phone Number: 04/29/2023, 1:31 PM  Clinical Narrative:   Kenn[facility] Center with [facility] recommended that CSW contact [facility] for placement assistance. CSW left a voicemail for their long-term services and supports referral coordinator.  Expected Discharge Plan and Services                                               Social Determinants of Health (SDOH) Interventions SDOH Screenings   Alcohol Screen: Low Risk  (05/16/2021)  Tobacco Use: Low Risk  (02/23/2023)    Readmission Risk Interventions     No data to display

## 2023-04-29 NOTE — ED Provider Notes (Signed)
Emergency Medicine Observation Re-evaluation Note  [facility] rounding  Patient is noted to be resting in bed comfortably without distress, sleeping.  Physical Exam   Vitals:   04/28/23 0959 04/28/23 2100  BP: 126/62 (!) 115/44  Pulse: 75 80  Resp: 18 16  Temp: 97.9 F (36.6 C) 98.5 F (36.9 C)  SpO2: 100% 98%    Blood glucose remain appropriate  ED Course / MDM    I have reviewed the labs performed to date as well as medications administered while in observation.  Recent changes in the last 24 hours include no acute events.  Plan  Current plan is for social work disposition.     Sharyn Creamer, MD 04/29/23 [phone]

## 2023-04-30 DIAGNOSIS — F209 Schizophrenia, unspecified: Secondary | ICD-10-CM | POA: Diagnosis not present

## 2023-04-30 LAB — CBG MONITORING, ED
Glucose-Capillary: 100 mg/dL — ABNORMAL HIGH (ref 70–99)
Glucose-Capillary: 218 mg/dL — ABNORMAL HIGH (ref 70–99)
Glucose-Capillary: 114 mg/dL — ABNORMAL HIGH (ref 70–99)
Glucose-Capillary: 90 mg/dL (ref 70–99)

## 2023-04-30 NOTE — ED Provider Notes (Signed)
Emergency Medicine Observation Re-evaluation Note  Larry Cook is a 62 y.o. male, seen on rounds today.  Pt initially presented to the ED for complaints of Psychiatric Evaluation (IVC) Currently, the patient is calm in NAD.  Physical Exam  BP 130/77 (BP Location: Right Arm)   Pulse 79   Temp 98.1 F (36.7 C) (Oral)   Resp 17   Ht 4\' 2"  (1.27 m)   Wt 90 kg   SpO2 99%   BMI 55.80 kg/m    ED Course / MDM  EKG:EKG Interpretation Date/Time:  Saturday March 09 2023 11:13:43 EDT Ventricular Rate:  81 PR Interval:  192 QRS Duration:  152 QT Interval:  422 QTC Calculation: 490 R Axis:   -73  Text Interpretation: Normal sinus rhythm Right bundle branch block Left anterior fascicular block  Bifascicular block  Left ventricular hypertrophy with repolarization abnormality ( R in aVL , Romhilt-Estes ) Cannot rule out Septal infarct , age undetermined Abnormal ECG When compared with ECG of 26-Feb-2023 12:52, PREVIOUS ECG IS PRESENT Confirmed by UNCONFIRMED, DOCTOR ([id number]), editor Lonell Face [phone]) on 03/11/2023 7:17:16 AM  I have reviewed the labs performed to date as well as medications administered while in observation.  Recent changes in the last 24 hours include none.  Plan  Current plan is for TOC disposition.    Shaune Pollack, MD 04/30/23 [phone]

## 2023-04-30 NOTE — TOC Progression Note (Signed)
Transition of Care [facility]) - Progression Note    Patient Details  Name: Larry Cook MRN: [id number] Date of Birth: 20-Aug-1961  Transition of Care [facility]) CM/SW Contact  Harriet Masson, RN Phone Number: 04/30/2023, 1:01 PM  Clinical Narrative:    Left VM with Velvet, Legal Guardian, requesting return call.         Expected Discharge Plan and Services                                               Social Determinants of Health (SDOH) Interventions SDOH Screenings   Alcohol Screen: Low Risk  (05/16/2021)  Tobacco Use: Low Risk  (02/23/2023)    Readmission Risk Interventions     No data to display

## 2023-05-01 DIAGNOSIS — F209 Schizophrenia, unspecified: Secondary | ICD-10-CM | POA: Diagnosis not present

## 2023-05-01 LAB — CBG MONITORING, ED
Glucose-Capillary: 113 mg/dL — ABNORMAL HIGH (ref 70–99)
Glucose-Capillary: 152 mg/dL — ABNORMAL HIGH (ref 70–99)
Glucose-Capillary: 86 mg/dL (ref 70–99)
Glucose-Capillary: 149 mg/dL — ABNORMAL HIGH (ref 70–99)

## 2023-05-01 NOTE — ED Notes (Signed)
Pt given PM snack at this time.  

## 2023-05-01 NOTE — ED Notes (Signed)
Pt cbg 86, did not want to get up to eat meal or take med.(S) due.  Will pass on to oncoming shift.

## 2023-05-01 NOTE — ED Provider Notes (Signed)
Emergency Medicine Observation Re-evaluation Note  Larry Cook is a 62 y.o. male, seen on rounds today.  Pt initially presented to the ED for complaints of Psychiatric Evaluation (IVC) Currently, the patient is calm in NAD.  Physical Exam  BP 122/71 (BP Location: Right Arm)   Pulse 97   Temp 98 F (36.7 C) (Oral)   Resp 18   Ht 4\' 2"  (1.27 m)   Wt 90 kg   SpO2 97%   BMI 55.80 kg/m    ED Course / MDM  EKG:EKG Interpretation Date/Time:  Saturday March 09 2023 11:13:43 EDT Ventricular Rate:  81 PR Interval:  192 QRS Duration:  152 QT Interval:  422 QTC Calculation: 490 R Axis:   -73  Text Interpretation: Normal sinus rhythm Right bundle branch block Left anterior fascicular block  Bifascicular block  Left ventricular hypertrophy with repolarization abnormality ( R in aVL , Romhilt-Estes ) Cannot rule out Septal infarct , age undetermined Abnormal ECG When compared with ECG of 26-Feb-2023 12:52, PREVIOUS ECG IS PRESENT Confirmed by UNCONFIRMED, DOCTOR ([id number]), editor Lonell Face [phone]) on 03/11/2023 7:17:16 AM  I have reviewed the labs performed to date as well as medications administered while in observation.  Recent changes in the last 24 hours include none.  Plan  Current plan is for TOC dispo.    Shaune Pollack, MD 05/01/23 [phone]

## 2023-05-01 NOTE — ED Notes (Signed)
TOC

## 2023-05-01 NOTE — ED Notes (Signed)
During nursing assessment Larry Cook shows baseline behavior, he smiles however does not respond to assessment questions.
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
## 2023-05-02 DIAGNOSIS — F209 Schizophrenia, unspecified: Secondary | ICD-10-CM | POA: Diagnosis not present

## 2023-05-02 LAB — CBG MONITORING, ED
Glucose-Capillary: 122 mg/dL — ABNORMAL HIGH (ref 70–99)
Glucose-Capillary: 240 mg/dL — ABNORMAL HIGH (ref 70–99)
Glucose-Capillary: 88 mg/dL (ref 70–99)
Glucose-Capillary: 149 mg/dL — ABNORMAL HIGH (ref 70–99)

## 2023-05-02 NOTE — ED Notes (Signed)
Patient given snack.  

## 2023-05-02 NOTE — ED Notes (Signed)
vol/toc.... 

## 2023-05-02 NOTE — ED Provider Notes (Signed)
Emergency Medicine Observation Re-evaluation Note  Larry Cook is a 62 y.o. male, seen on rounds today.  Pt initially presented to the ED for complaints of Psychiatric Evaluation (IVC)  Currently, the patient is resting in bed. No reported issues overnight from nursing team.   Physical Exam  BP 116/64   Pulse 91   Temp (!) 96.8 F (36 C) (Axillary)   Resp 14   Ht 4\' 2"  (1.27 m)   Wt 90 kg   SpO2 97%   BMI 55.80 kg/m  Physical Exam General: Resting comfortably in bed  ED Course / MDM   BGL 86-149 last 24 hours  Plan  Current plan is for dispo per social work.    Trinna Post, MD 05/02/23 [phone]

## 2023-05-02 NOTE — ED Notes (Signed)
VOL/Pending Placement after assessment on 09/13

## 2023-05-02 NOTE — TOC Progression Note (Addendum)
Transition of Care [facility]) - Progression Note    Patient Details  Name: Larry Cook MRN: [id number] Date of Birth: July 14, 1961  Transition of Care [facility]) CM/SW Contact  Margarito Liner, LCSW Phone Number: 05/02/2023, 10:29 AM  Clinical Narrative:  Phone number that sister gave for "Amber" is actually the previous ED RNCM's phone number. CSW still unable to reach [place] long-term care coordinator by phone. CSW sent them an email to ask about referral process.   12:32 pm: [facility] requires that an intake form be filled out and it needs to include a psychological evaluation within the last 5 years to include IQ scores, adaptive functioning scores, and statement of IDD diagnosis prior to age 50 or proof of IDD diagnosis prior to age 5. Per [facility] director, the hospital is unable to provide this evaluation. CSW emailed the [facility] Coordinator to notify and see if they had that documentation on file.  3:17 pm: Received notification that a local group home owner has open male beds. CSW faxed referral for review.  3:42: Synetta Fail with [facility] will come assess patient tomorrow around 10:00. RN is aware.  Expected Discharge Plan and Services                                               Social Determinants of Health (SDOH) Interventions SDOH Screenings   Alcohol Screen: Low Risk  (05/16/2021)  Tobacco Use: Low Risk  (02/23/2023)    Readmission Risk Interventions     No data to display

## 2023-05-03 DIAGNOSIS — F209 Schizophrenia, unspecified: Secondary | ICD-10-CM | POA: Diagnosis not present

## 2023-05-03 LAB — CBG MONITORING, ED
Glucose-Capillary: 112 mg/dL — ABNORMAL HIGH (ref 70–99)
Glucose-Capillary: 124 mg/dL — ABNORMAL HIGH (ref 70–99)
Glucose-Capillary: 70 mg/dL (ref 70–99)
Glucose-Capillary: 90 mg/dL (ref 70–99)

## 2023-05-03 NOTE — ED Provider Notes (Signed)
Emergency Medicine Observation Re-evaluation Note  Harvel Eisenman is a 62 y.o. male, seen on rounds today.  Pt initially presented to the ED for complaints of Psychiatric Evaluation (IVC)  Currently, the patient is is no acute distress. Denies any concerns at this time.  Physical Exam  Blood pressure (!) 162/88, pulse 100, temperature (!) 97.5 F (36.4 C), resp. rate 20, height 4\' 2"  (1.27 m), weight 90 kg, SpO2 98%.  Physical Exam: General: No apparent distress Psych: Resting comfortably     ED Course / MDM     I have reviewed the labs performed to date as well as medications administered while in observation.  Recent changes in the last 24 hours include: No acute events overnight.  Plan   Current plan: Patient awaiting social work disposition    Corena Herter, MD 05/03/23 [phone]

## 2023-05-03 NOTE — TOC Progression Note (Signed)
Transition of Care [facility]) - Progression Note    Patient Details  Name: Rhamel Bouley MRN: [id number] Date of Birth: 1960-11-19  Transition of Care [facility]) CM/SW Contact  Margarito Liner, LCSW Phone Number: 05/03/2023, 11:55 AM  Clinical Narrative:   [facility] in [place]  assessed patient this morning and would like to offer him a bed. CSW sent a secure email to Iline Oven with [facility] to notify and asked her to call Synetta Fail regarding financial logistics. Synetta Fail will also call sister. CSW called sister and provided update.  Expected Discharge Plan and Services                                               Social Determinants of Health (SDOH) Interventions SDOH Screenings   Alcohol Screen: Low Risk  (05/16/2021)  Tobacco Use: Low Risk  (02/23/2023)    Readmission Risk Interventions     No data to display

## 2023-05-03 NOTE — ED Notes (Signed)
Dinner tray provided

## 2023-05-04 DIAGNOSIS — F209 Schizophrenia, unspecified: Secondary | ICD-10-CM | POA: Diagnosis not present

## 2023-05-04 LAB — CBG MONITORING, ED
Glucose-Capillary: 98 mg/dL (ref 70–99)
Glucose-Capillary: 99 mg/dL (ref 70–99)
Glucose-Capillary: 97 mg/dL (ref 70–99)
Glucose-Capillary: 121 mg/dL — ABNORMAL HIGH (ref 70–99)

## 2023-05-04 NOTE — ED Notes (Signed)
Patient took medications, insulin administered as ordered, no signs of distress, no behavioral issues, He ate 100% of breakfast and beverage, staff will continue to monitor.

## 2023-05-04 NOTE — ED Notes (Signed)
vol/toc.... 

## 2023-05-04 NOTE — ED Notes (Signed)
Patient ate 100% of lunch and beverage.  

## 2023-05-04 NOTE — ED Provider Notes (Signed)
Emergency Medicine Observation Re-evaluation Note  Larry Cook is a 62 y.o. male, seen on rounds today.  Pt initially presented to the ED for complaints of Psychiatric Evaluation (IVC)  Currently, the patient is is no acute distress. Denies any concerns at this time.  Physical Exam   Vitals:   05/03/23 1005 05/03/23 2013  BP: 115/75 (!) 141/87  Pulse: 62 85  Resp: 17 18  Temp: 97.9 F (36.6 C) 98.6 F (37 C)  SpO2: 98% 98%     Physical Exam: General: No apparent distress Psych: Resting comfortably     Glucose 90-149  ED Course / MDM     I have reviewed the labs performed to date as well as medications administered while in observation.  Recent changes in the last 24 hours include: No acute events overnight.  Plan   Current plan: Patient awaiting social work disposition      Sharyn Creamer, MD 05/04/23 [phone]

## 2023-05-05 DIAGNOSIS — F209 Schizophrenia, unspecified: Secondary | ICD-10-CM | POA: Diagnosis not present

## 2023-05-05 LAB — CBG MONITORING, ED
Glucose-Capillary: 108 mg/dL — ABNORMAL HIGH (ref 70–99)
Glucose-Capillary: 112 mg/dL — ABNORMAL HIGH (ref 70–99)
Glucose-Capillary: 79 mg/dL (ref 70–99)
Glucose-Capillary: 117 mg/dL — ABNORMAL HIGH (ref 70–99)

## 2023-05-05 NOTE — ED Notes (Signed)
Patient CBG 112. RN notified

## 2023-05-05 NOTE — ED Notes (Signed)
Pt given dinner tray.

## 2023-05-05 NOTE — ED Notes (Signed)
Pt up and walking around dayroom, no distress noted. Will cont to monitor

## 2023-05-05 NOTE — ED Notes (Signed)
Hospital meal provided, pt tolerated w/o complaints.  Waste discarded appropriately.  

## 2023-05-05 NOTE — ED Provider Notes (Signed)
Emergency Medicine Observation Re-evaluation Note  Physical Exam   BP (!) 145/83   Pulse 82   Temp 97.9 F (36.6 C)   Resp 17   Ht 4\' 2"  (1.27 m)   Wt 90 kg   SpO2 100%   BMI 55.80 kg/m   Patient appears in no acute distress.  ED Course / MDM   No reported events during my shift at the time of this note.   Pt is awaiting dispo from SW   Pilar Jarvis MD    Pilar Jarvis, MD 05/05/23 [phone]

## 2023-05-06 DIAGNOSIS — F209 Schizophrenia, unspecified: Secondary | ICD-10-CM | POA: Diagnosis not present

## 2023-05-06 LAB — CBG MONITORING, ED
Glucose-Capillary: 96 mg/dL (ref 70–99)
Glucose-Capillary: 185 mg/dL — ABNORMAL HIGH (ref 70–99)
Glucose-Capillary: 104 mg/dL — ABNORMAL HIGH (ref 70–99)
Glucose-Capillary: 95 mg/dL (ref 70–99)

## 2023-05-06 NOTE — ED Provider Notes (Signed)
Emergency Medicine Observation Re-evaluation Note  Physical Exam   BP 134/74   Pulse 71   Temp 98.1 F (36.7 C) (Oral)   Resp 18   Ht 4\' 2"  (1.27 m)   Wt 90 kg   SpO2 100%   BMI 55.80 kg/m   Patient appears in no acute distress.  ED Course / MDM   No reported events during my shift at the time of this note.   Pt is awaiting dispo from consultants   Pilar Jarvis MD    Pilar Jarvis, MD 05/06/23 [phone]

## 2023-05-07 DIAGNOSIS — F209 Schizophrenia, unspecified: Secondary | ICD-10-CM | POA: Diagnosis not present

## 2023-05-07 LAB — CBG MONITORING, ED
Glucose-Capillary: 82 mg/dL (ref 70–99)
Glucose-Capillary: 58 mg/dL — ABNORMAL LOW (ref 70–99)
Glucose-Capillary: 61 mg/dL — ABNORMAL LOW (ref 70–99)
Glucose-Capillary: 83 mg/dL (ref 70–99)
Glucose-Capillary: 97 mg/dL (ref 70–99)
Glucose-Capillary: 112 mg/dL — ABNORMAL HIGH (ref 70–99)
Glucose-Capillary: 78 mg/dL (ref 70–99)

## 2023-05-07 NOTE — Inpatient Diabetes Management (Signed)
Inpatient Diabetes Program Recommendations  AACE/ADA: New Consensus Statement on Inpatient Glycemic Control (2015)  Target Ranges:  Prepandial:   less than 140 mg/dL      Peak postprandial:   less than 180 mg/dL (1-2 hours)      Critically ill patients:  140 - 180 mg/dL   Lab Results  Component Value Date   GLUCAP 61 (L) 05/07/2023   HGBA1C 10.9 (H) 05/16/2021    Revi  Latest Reference Range & Units 05/06/23 09:06 05/06/23 11:47 05/06/23 16:31 05/06/23 21:18 05/07/23 07:54 05/07/23 11:45  Glucose-Capillary 70 - 99 mg/dL 96 782 (H) 956 (H) 95 82 61 (L)  (H): Data is abnormally high (L): Data is abnormally lowew of Glycemic Control  Diabetes history: DM2  Current orders for Inpatient glycemic control:  Semglee 15 units every day Novolog 0-9 units TID and 0-5 units at bedtime Novolog 4 units TID with meals Metformin 1000 mg BID  Inpatient Diabetes Program Recommendations:    Might consider decreasing basal insulin a bit:  Semglee 12 units every day  Will continue to follow while inpatient.  Thank you, Dulce Sellar, MSN, CDCES Diabetes Coordinator Inpatient Diabetes Program [phone] (team pager from 8a-5p)

## 2023-05-07 NOTE — ED Notes (Signed)
VOL/Pending TOC Placement

## 2023-05-07 NOTE — ED Notes (Signed)
Pt eating the remainder of his lunch.

## 2023-05-07 NOTE — ED Notes (Signed)
Hospital meal provided.  100% consumed, pt tolerated w/o complaints.  Waste discarded appropriately.   

## 2023-05-07 NOTE — TOC Progression Note (Signed)
Transition of Care [facility]) - Progression Note    Patient Details  Name: Deitrick Capra MRN: [id number] Date of Birth: Dec 02, 1960  [facility]) CM/SW Contact  Margarito Liner, LCSW Phone Number: 05/07/2023, 10:44 AM  Clinical Narrative:  [facility] case manager is checking to see what clinicals she needs for placement.   Expected Discharge Plan and Services                                               Social Determinants of Health (SDOH) Interventions SDOH Screenings   Alcohol Screen: Low Risk  (05/16/2021)  Tobacco Use: Low Risk  (02/23/2023)    Readmission Risk Interventions     No data to display

## 2023-05-07 NOTE — ED Notes (Signed)
Given 4 oz orange juice and lunch tray.  Hospital meal provided.  100% consumed, pt tolerated w/o complaints.

## 2023-05-07 NOTE — ED Notes (Signed)
Breakfast given.

## 2023-05-07 NOTE — ED Notes (Signed)
Fsbs 112

## 2023-05-07 NOTE — ED Notes (Signed)
Hospital meal provided, pt tolerated w/o complaints.  Waste discarded appropriately.  

## 2023-05-07 NOTE — ED Notes (Signed)
Pt given 4 oz soda and cookies.

## 2023-05-07 NOTE — ED Notes (Signed)
Snack provided

## 2023-05-08 DIAGNOSIS — F209 Schizophrenia, unspecified: Secondary | ICD-10-CM | POA: Diagnosis not present

## 2023-05-08 LAB — CBG MONITORING, ED
Glucose-Capillary: 74 mg/dL (ref 70–99)
Glucose-Capillary: 89 mg/dL (ref 70–99)
Glucose-Capillary: 68 mg/dL — ABNORMAL LOW (ref 70–99)
Glucose-Capillary: 101 mg/dL — ABNORMAL HIGH (ref 70–99)

## 2023-05-08 MED ORDER — IBUPROFEN 600 MG PO TABS
600.0000 mg | ORAL_TABLET | Freq: Three times a day (TID) | ORAL | Status: DC | PRN
  Administered 2023-05-09 – 2023-05-16 (×3): 600 mg via ORAL
  Filled 2023-05-08 (×3): qty 1

## 2023-05-08 NOTE — TOC Progression Note (Addendum)
Transition of Care [facility]) - Progression Note    Patient Details  Name: Sharif Wolverton MRN: [id number] Date of Birth: 1961-02-20  Transition of Care [facility]) CM/SW Contact  Margarito Liner, LCSW Phone Number: 05/08/2023, 9:40 AM  Clinical Narrative:   CSW emailed patient's [facility] care coordinator to see what documents she needs for placement. Patient's Quantiferon Gold came back negative. CSW left voicemail for [facility] owner to see if she had spoken to sister yet.  3:41 pm: Sent psych notes to [facility] with [facility] and Synetta Fail with [facility]. Synetta Fail spoke to sister regarding payment, paperwork, etc. Synetta Fail will create a patient-centered-plan with the psych notes that are provided and the psychiatrist will have to sign it.  Expected Discharge Plan and Services                                               Social Determinants of Health (SDOH) Interventions SDOH Screenings   Alcohol Screen: Low Risk  (05/16/2021)  Tobacco Use: Low Risk  (02/23/2023)    Readmission Risk Interventions     No data to display

## 2023-05-08 NOTE — ED Notes (Signed)
Pt continues to present at baseline.  Compliant with medications

## 2023-05-08 NOTE — ED Notes (Signed)
VOL/Pending placement

## 2023-05-08 NOTE — ED Notes (Signed)
Hospital meal provided, pt tolerated w/o complaints.  Waste discarded appropriately.  

## 2023-05-08 NOTE — ED Provider Notes (Signed)
Emergency Medicine Observation Re-evaluation Note  Larry Cook is a 62 y.o. male, seen on rounds today.  Pt initially presented to the ED for complaints of Psychiatric Evaluation (IVC)  Currently, the patient is calm, no acute complaints.  Physical Exam  Blood pressure (!) 146/94, pulse 75, temperature 98.3 F (36.8 C), temperature source Oral, resp. rate 14, height 4\' 2"  (1.27 m), weight 90 kg, SpO2 94%. Physical Exam General: NAD Lungs: CTAB Psych: not agitated  ED Course / MDM  EKG:    I have reviewed the labs performed to date as well as medications administered while in observation.  Recent changes in the last 24 hours include no acute events overnight.    Plan  Current plan is for TOC placement.   Sharman Cheek, MD 05/08/23 1046

## 2023-05-08 NOTE — ED Notes (Signed)
VOL  PENDING  PLACEMENT 

## 2023-05-09 DIAGNOSIS — F209 Schizophrenia, unspecified: Secondary | ICD-10-CM | POA: Diagnosis not present

## 2023-05-09 LAB — CBG MONITORING, ED
Glucose-Capillary: 79 mg/dL (ref 70–99)
Glucose-Capillary: 153 mg/dL — ABNORMAL HIGH (ref 70–99)
Glucose-Capillary: 56 mg/dL — ABNORMAL LOW (ref 70–99)
Glucose-Capillary: 57 mg/dL — ABNORMAL LOW (ref 70–99)
Glucose-Capillary: 175 mg/dL — ABNORMAL HIGH (ref 70–99)
Glucose-Capillary: 135 mg/dL — ABNORMAL HIGH (ref 70–99)

## 2023-05-09 MED ORDER — INSULIN ASPART 100 UNIT/ML IJ SOLN
2.0000 [IU] | Freq: Three times a day (TID) | INTRAMUSCULAR | Status: DC
  Administered 2023-05-09 – 2023-05-17 (×18): 2 [IU] via SUBCUTANEOUS
  Filled 2023-05-09 (×18): qty 1

## 2023-05-09 MED ORDER — INSULIN GLARGINE-YFGN 100 UNIT/ML ~~LOC~~ SOLN
10.0000 [IU] | SUBCUTANEOUS | Status: DC
  Administered 2023-05-09 – 2023-05-14 (×6): 10 [IU] via SUBCUTANEOUS
  Filled 2023-05-09 (×7): qty 0.1

## 2023-05-09 NOTE — ED Notes (Signed)
When patient CBG was taken was 56 patient has been sleeping and his lunch tray was full does not look like patient attempted to consume any lunch. Patient was given OJ and apples and peanut butter and will recheck CBG level. MD Larinda Buttery was notified. Patient has insulin and metformin due at 1700. Provider stated to hold insulin but still give metformin medication at this time.

## 2023-05-09 NOTE — ED Provider Notes (Signed)
Emergency Medicine Observation Re-evaluation Note  Larry Cook is a 62 y.o. male, seen on rounds today.  Pt initially presented to the ED for complaints of Psychiatric Evaluation (IVC)   Physical Exam  BP (!) 130/109 (BP Location: Left Arm)   Pulse 87   Temp 98.2 F (36.8 C) (Oral)   Resp 18   Ht 4\' 2"  (1.27 m)   Wt 90 kg   SpO2 100%   BMI 55.80 kg/m  Physical Exam General: NAD  ED Course / MDM  EKG:  I have reviewed the labs performed to date as well as medications administered while in observation.     Plan  Current plan is for [facility] placement    Willy Eddy, MD 05/09/23 [phone]

## 2023-05-09 NOTE — ED Notes (Signed)
Patient is eating lunch tray provided instead at this time and drinking liquids.

## 2023-05-09 NOTE — ED Notes (Signed)
Patient received snack, no signs of distress, will continue to monitor for safety.

## 2023-05-09 NOTE — Inpatient Diabetes Management (Signed)
Inpatient Diabetes Program Recommendations  AACE/ADA: New Consensus Statement on Inpatient Glycemic Control  Target Ranges:  Prepandial:   less than 140 mg/dL      Peak postprandial:   less than 180 mg/dL (1-2 hours)      Critically ill patients:  140 - 180 mg/dL    Latest Reference Range & Units 05/08/23 08:40 05/08/23 11:39 05/08/23 16:44 05/08/23 21:24  Glucose-Capillary 70 - 99 mg/dL 74 89 68 (L) 528 (H)    Latest Reference Range & Units 05/07/23 07:54 05/07/23 11:45 05/07/23 12:01 05/07/23 12:19 05/07/23 12:36 05/07/23 16:58 05/07/23 21:01  Glucose-Capillary 70 - 99 mg/dL 82 61 (L) 58 (L) 83 97 112 (H) 78   Review of Glycemic Control  Diabetes history: DM2 Outpatient Diabetes medications: None listed on home med list; per PCP note on 08/02/22 was prescribed Lantus 50 units at bedtime and Metformin 1000 mg BID Current orders for Inpatient glycemic control: Semglee 15 units Q24H, Novolog 0-9 units TID with meals, Novolog 0-5 units at bedtime, Novolog 4 units TID with meals, Metformin 1000 mg BID  Inpatient Diabetes Program Recommendations:    Insulin: Please decrease Semglee to 10 units Q24H and meal coverage to Novolog 2 units TID with meals.  Thanks, Orlando Penner, RN, MSN, CDCES Diabetes Coordinator Inpatient Diabetes Program [phone] (Team Pager from 8am to 5pm)

## 2023-05-09 NOTE — ED Notes (Signed)
Patient ate 25 % of lunch and beverage. No signs of distress.

## 2023-05-09 NOTE — ED Notes (Signed)
Patient refusing to want to eat dinner tray at this time. At [facility] recheck only up to 57. Patient provided another pop and going to get patient lunch tray with sandwich and attempt to eat him to eat. Provider notified will recheck CBG at 1800.

## 2023-05-09 NOTE — TOC Progression Note (Signed)
Transition of Care [facility]) - Progression Note    Patient Details  Name: Larry Cook MRN: [id number] Date of Birth: 04/25/1961  Transition of Care [facility]) CM/SW Contact  Margarito Liner, LCSW Phone Number: 05/09/2023, 11:28 AM  Clinical Narrative:  [facility] owner will call sister to coordinate a time to meet here tomorrow for paperwork. CSW notified [facility] care coordinator.   Expected Discharge Plan and Services                                               Social Determinants of Health (SDOH) Interventions SDOH Screenings   Alcohol Screen: Low Risk  (05/16/2021)  Tobacco Use: Low Risk  (02/23/2023)    Readmission Risk Interventions     No data to display

## 2023-05-09 NOTE — ED Notes (Signed)
Trash taken out of patients room and patient provided snack at this time.

## 2023-05-09 NOTE — ED Notes (Signed)
vol/pending placement.. 

## 2023-05-09 NOTE — ED Notes (Signed)
Hospital meal provided, pt tolerated w/o complaints.  Waste discarded appropriately.

## 2023-05-09 NOTE — ED Notes (Signed)
Checked patient CBG now 175. MD Jessup aware. Next CBG check will be doneat patients next scheduled check around 2200.

## 2023-05-09 NOTE — ED Notes (Addendum)
Patient is awake, blood sugar obtained 79 gave patient juice and breakfast, He ate 100% of breakfast and beverage, Patient is pleasant, no signs of distress, no behavioral issues noted, does not speak often, but nods head and follows commands.

## 2023-05-10 DIAGNOSIS — F209 Schizophrenia, unspecified: Secondary | ICD-10-CM | POA: Diagnosis not present

## 2023-05-10 LAB — CBG MONITORING, ED
Glucose-Capillary: 94 mg/dL (ref 70–99)
Glucose-Capillary: 122 mg/dL — ABNORMAL HIGH (ref 70–99)
Glucose-Capillary: 95 mg/dL (ref 70–99)
Glucose-Capillary: 139 mg/dL — ABNORMAL HIGH (ref 70–99)

## 2023-05-10 NOTE — ED Notes (Signed)
VOl TOC

## 2023-05-10 NOTE — ED Provider Notes (Signed)
Emergency Medicine Observation Re-evaluation Note  Larry Cook is a 62 y.o. male, seen on rounds today.  Pt initially presented to the ED for complaints of Psychiatric Evaluation (IVC) Currently, the patient is resting comfortably in his room.  Physical Exam  BP 106/66   Pulse 74   Temp 98 F (36.7 C) (Oral)   Resp 18   Ht 4\' 2"  (1.27 m)   Wt 90 kg   SpO2 98%   BMI 55.80 kg/m  Physical Exam General: No acute distress  ED Course / MDM   No significant changes in the last 24 hours.  Plan  Current plan is for TOC placement.    Dionne Bucy, MD 05/10/23 1349

## 2023-05-10 NOTE — ED Notes (Signed)
Breakfast placed at side. 

## 2023-05-10 NOTE — TOC Progression Note (Signed)
Transition of Care [facility]) - Progression Note    Patient Details  Name: Larry Cook MRN: [id number] Date of Birth: 04/23/1961  Transition of Care [facility]) CM/SW Contact  Marquita Palms, LCSW Phone Number: 05/10/2023, 10:01 AM  Clinical Narrative:     CSW spoke with Synetta Fail of [facility] and she reports that the sister should be signing the paperwork today. She will be calling her this afternoon and possible placement of patient on Monday, if not available on the weekend.       Expected Discharge Plan and Services                                               Social Determinants of Health (SDOH) Interventions SDOH Screenings   Alcohol Screen: Low Risk  (05/16/2021)  Tobacco Use: Low Risk  (02/23/2023)    Readmission Risk Interventions     No data to display

## 2023-05-10 NOTE — ED Notes (Signed)
VOL Pending TOC placement

## 2023-05-10 NOTE — ED Notes (Signed)
Ate 25% of meal tray.

## 2023-05-11 DIAGNOSIS — F209 Schizophrenia, unspecified: Secondary | ICD-10-CM | POA: Diagnosis not present

## 2023-05-11 LAB — CBG MONITORING, ED
Glucose-Capillary: 95 mg/dL (ref 70–99)
Glucose-Capillary: 183 mg/dL — ABNORMAL HIGH (ref 70–99)
Glucose-Capillary: 85 mg/dL (ref 70–99)
Glucose-Capillary: 97 mg/dL (ref 70–99)

## 2023-05-11 NOTE — ED Notes (Signed)
Hospital meal provided, pt tolerated w/o complaints.  Waste discarded appropriately.  

## 2023-05-11 NOTE — ED Notes (Signed)
Spoke to patients guardian Velvet to inquire if she has signed the required paperwork for Mr Bessett to be transported and stay at [facility] stated " The Dr. Era Skeen want me driving, I just got out of the hospital with congestive heart failure."  Staff asked if there was someone who could drive her, she stated "I don't know I can see if I can get somebody to take me over there"  Staff reiterated the importance of signing these documents as Mr Serven has placement ready, pending her signature, the guardian.  Ms Annett Fabian stated that she would "call you back later and let you know if I get a ride."

## 2023-05-11 NOTE — ED Notes (Signed)
Spoke to Ms Synetta Fail @ [facility] - [phone], She had been waiting to meet with Mr Lunn's guardian/sister [name] - [phone]  to obtain Mr Beltre' insurance cards as well as have Ms Jac Canavan sign over payee papers to the group home.  Ms Synetta Fail asked that we contact the guardian and have a meeting set up for Monday 05-13-2023 @ 1300 to finalize the paperwork.  Ms Synetta Fail from [facility] stated she will be taking Mr Babauta back with her to the group home Monday 05-13-23

## 2023-05-12 DIAGNOSIS — F209 Schizophrenia, unspecified: Secondary | ICD-10-CM | POA: Diagnosis not present

## 2023-05-12 NOTE — ED Notes (Signed)
Patient is vol pending placement 

## 2023-05-12 NOTE — ED Notes (Signed)
VOLUNTARY/PENDING PLACEMENT: (SEE UPDATED TOC PROGRESS NOTE)

## 2023-05-12 NOTE — ED Notes (Signed)
Blood sugar 91. Monitor failed to record patient I.d

## 2023-05-12 NOTE — ED Notes (Signed)
Pt continues to present at baseline.  Calm, and compliant with medications. Cont to monitor as ordered.

## 2023-05-12 NOTE — ED Notes (Signed)
Lunch tray and beverage provided 

## 2023-05-12 NOTE — ED Notes (Signed)
Pt CBG 74.

## 2023-05-12 NOTE — ED Notes (Signed)
Pts CBG was 96. Glucometer not recognizing pts ID. POC sheet emailed

## 2023-05-12 NOTE — ED Notes (Signed)
Hospital meal provided, pt tolerated w/o complaints.  Waste discarded appropriately.  

## 2023-05-12 NOTE — ED Provider Notes (Signed)
Emergency Medicine Observation Re-evaluation Note  Larry Cook is a 62 y.o. male, seen on rounds today.  Pt initially presented to the ED for complaints of Psychiatric Evaluation (IVC)  Currently, the patient is resting in bed. No reported issues from nursing team.   Physical Exam  BP 114/69   Pulse 84   Temp 97.7 F (36.5 C)   Resp 16   Ht 4\' 2"  (1.27 m)   Wt 90 kg   SpO2 97%   BMI 55.80 kg/m  Physical Exam General: Resting in bed  ED Course / MDM   BGL 85-183 yesterday  Plan  Current plan is for dispo per social work.    Trinna Post, MD 05/12/23 [phone]

## 2023-05-13 DIAGNOSIS — F209 Schizophrenia, unspecified: Secondary | ICD-10-CM | POA: Diagnosis not present

## 2023-05-13 LAB — CBG MONITORING, ED
Glucose-Capillary: 91 mg/dL (ref 70–99)
Glucose-Capillary: 96 mg/dL (ref 70–99)
Glucose-Capillary: 74 mg/dL (ref 70–99)
Glucose-Capillary: 96 mg/dL (ref 70–99)
Glucose-Capillary: 134 mg/dL — ABNORMAL HIGH (ref 70–99)
Glucose-Capillary: 132 mg/dL — ABNORMAL HIGH (ref 70–99)

## 2023-05-13 NOTE — TOC Progression Note (Signed)
Transition of Care [facility]) - Progression Note    Patient Details  Name: Finnigan Orbin MRN: [id number] Date of Birth: 03-Mar-1961  Transition of Care [facility]) CM/SW Contact  Marquita Palms, LCSW Phone Number: 05/13/2023, 4:27 PM  Clinical Narrative:     CSW met with patient's guardian and Synetta Fail from the [facility]. Synetta Fail explained to the guardian that she is owed $300 and could not take him until she paid it. The guardian reported that she was not able to pay $300. CSW spoke with supervisor concerning LOG which is not feesible in the case and Synetta Fail with [facility] reported she would take him on Friday at a loss. She explained to the guardian that the SSI check would need to be signed over to her on the 1st or a direct deposit. The guardian agreed to signing over the check.       Expected Discharge Plan and Services                                Social Determinants of Health (SDOH) Interventions SDOH Screenings   Alcohol Screen: Low Risk  (05/16/2021)  Tobacco Use: Low Risk  (02/23/2023)    Readmission Risk Interventions     No data to display

## 2023-05-13 NOTE — ED Notes (Signed)
Larry Cook  continues to present at baseline.  Calm, and compliant with medications. Cont to monitor as ordered.

## 2023-05-13 NOTE — ED Notes (Signed)
Pt's guardian/sister, Personal assistant W. arrived to meet with group home owner to sign papers as well as turn over Larry. Cook insurance cards.  The group home owner explained that she is ready to take Larry Cook with her today, however his guadian/payee will be responsible for the monies from the 9-23 - 9-30.  Larry Cook refused to pay.  The group home owner stated she would be back Friday to pick up Larry Cook.  SW was onsite during this meeting. Larry Cook continues to be clear of any medical or acute psych concerns.

## 2023-05-13 NOTE — ED Notes (Signed)
This NT provided pt with pm snack. 

## 2023-05-13 NOTE — ED Notes (Signed)
CBG=131  Glucometer would not scan patient's arm band was obtained by override.

## 2023-05-13 NOTE — ED Notes (Signed)
ENVIRONMENTAL ASSESSMENT Potentially harmful objects out of patient reach: Yes.   Personal belongings secured: Yes.   Patient dressed in hospital provided attire only: Yes.   Plastic bags out of patient reach: Yes.   Patient care equipment (cords, cables, call bells, lines, and drains) shortened, removed, or accounted for: Yes.   Equipment and supplies removed from bottom of stretcher: Yes.   Potentially toxic materials out of patient reach: Yes.   Sharps container removed or out of patient reach: Yes.

## 2023-05-13 NOTE — ED Provider Notes (Signed)
Emergency Medicine Observation Re-evaluation Note  Larry Cook is a 62 y.o. male, seen on rounds today.  Pt initially presented to the ED for complaints of Psychiatric Evaluation (IVC) Currently, the patient is awaiting dispo.  Physical Exam  BP 129/67   Pulse 89   Temp 98.1 F (36.7 C) (Oral)   Resp 17   Ht 4\' 2"  (1.27 m)   Wt 90 kg   SpO2 99%   BMI 55.80 kg/m  Physical Exam General: resting comfortably   ED Course / MDM  Blood sugar slightly elevated this morning  Plan  Current plan is for dispo.    Phineas Semen, MD 05/13/23 1005

## 2023-05-14 DIAGNOSIS — F209 Schizophrenia, unspecified: Secondary | ICD-10-CM | POA: Diagnosis not present

## 2023-05-14 LAB — CBG MONITORING, ED
Glucose-Capillary: 82 mg/dL (ref 70–99)
Glucose-Capillary: 131 mg/dL — ABNORMAL HIGH (ref 70–99)
Glucose-Capillary: 82 mg/dL (ref 70–99)
Glucose-Capillary: 112 mg/dL — ABNORMAL HIGH (ref 70–99)

## 2023-05-14 NOTE — ED Notes (Signed)
Snack was provided.

## 2023-05-14 NOTE — ED Notes (Signed)
Pt received dinner tray, diet soda and cookies given to help with low blood sugar.

## 2023-05-14 NOTE — ED Notes (Signed)
Pt. Is VOL./ pending placement

## 2023-05-14 NOTE — ED Notes (Signed)
Pt. Alert and oriented, warm and dry, in no distress. Pt. Denies SI, HI, and AVH. Pt. Encouraged to let nursing staff know of any concerns or needs.

## 2023-05-14 NOTE — ED Notes (Signed)
CBG was 65. RN notified

## 2023-05-15 DIAGNOSIS — F209 Schizophrenia, unspecified: Secondary | ICD-10-CM | POA: Diagnosis not present

## 2023-05-15 LAB — CBG MONITORING, ED
Glucose-Capillary: 65 mg/dL — ABNORMAL LOW (ref 70–99)
Glucose-Capillary: 89 mg/dL (ref 70–99)
Glucose-Capillary: 134 mg/dL — ABNORMAL HIGH (ref 70–99)
Glucose-Capillary: 90 mg/dL (ref 70–99)
Glucose-Capillary: 91 mg/dL (ref 70–99)

## 2023-05-15 MED ORDER — INSULIN GLARGINE-YFGN 100 UNIT/ML ~~LOC~~ SOLN
7.0000 [IU] | SUBCUTANEOUS | Status: DC
  Administered 2023-05-16 – 2023-05-17 (×2): 7 [IU] via SUBCUTANEOUS
  Filled 2023-05-15 (×2): qty 0.07

## 2023-05-15 NOTE — ED Notes (Signed)
Pt CBG 91.

## 2023-05-15 NOTE — ED Notes (Signed)
Pt given pm snack and beverage.

## 2023-05-15 NOTE — ED Provider Notes (Signed)
Emergency Medicine Observation Re-evaluation Note  Larry Cook is a 62 y.o. male, seen on rounds today.  Pt initially presented to the ED for complaints of Psychiatric Evaluation (IVC)  Currently, the patient is resting comfortably.  Physical Exam  BP 125/78 (BP Location: Right Arm)   Pulse 85   Temp 97.8 F (36.6 C) (Oral)   Resp 18   Ht 4\' 2"  (1.27 m)   Wt 90 kg   SpO2 98%   BMI 55.80 kg/m  General: No acute distress Cardiac: Well-perfused extremities Lungs: No respiratory distress Psych: Appropriate mood and affect  ED Course / MDM  EKG:  I have reviewed the labs performed to date as well as medications administered while in observation.  Recent changes in the last 24 hours include none.  Plan  Current plan is for placement.   Merwyn Katos, MD 05/15/23 [phone]

## 2023-05-15 NOTE — Inpatient Diabetes Management (Signed)
Inpatient Diabetes Program Recommendations  AACE/ADA: New Consensus Statement on Inpatient Glycemic Control   Target Ranges:  Prepandial:   less than 140 mg/dL      Peak postprandial:   less than 180 mg/dL (1-2 hours)      Critically ill patients:  140 - 180 mg/dL    Latest Reference Range & Units 05/14/23 09:06 05/14/23 12:35 05/14/23 16:25 05/14/23 17:07 05/14/23 21:09  Glucose-Capillary 70 - 99 mg/dL 82 638 (H) 65 (L) 89 756 (H)  (H): Data is abnormally high (L): Data is abnormally low  Review of Glycemic Control  Current orders for Inpatient glycemic control: Semglee 10 units Q24H, Novolog 2 units TID with meals, Novolog 0-9 units TID with meals, Novolog 0-5 untis at bedtime, Metformin 1000 mg BID  Inpatient Diabetes Program Recommendations:    Insulin: CBG ranged from 65-134 mg/dl on 4/33/29; no CBG in chart yet for today. Please consider decreasing Semglee to 7 units Q24H.  Thanks, Orlando Penner, RN, MSN, CDCES Diabetes Coordinator Inpatient Diabetes Program [phone] (Team Pager from 8am to 5pm)

## 2023-05-15 NOTE — ED Notes (Signed)
ENVIRONMENTAL ASSESSMENT Potentially harmful objects out of patient reach: Yes.   Personal belongings secured: Yes.   Patient dressed in hospital provided attire only: Yes.   Plastic bags out of patient reach: Yes.   Patient care equipment (cords, cables, call bells, lines, and drains) shortened, removed, or accounted for: Yes.   Equipment and supplies removed from bottom of stretcher: Yes.   Potentially toxic materials out of patient reach: Yes.   Sharps container removed or out of patient reach: Yes.

## 2023-05-15 NOTE — ED Notes (Signed)
CBG 134

## 2023-05-16 DIAGNOSIS — F209 Schizophrenia, unspecified: Secondary | ICD-10-CM | POA: Diagnosis not present

## 2023-05-16 LAB — CBG MONITORING, ED
Glucose-Capillary: 123 mg/dL — ABNORMAL HIGH (ref 70–99)
Glucose-Capillary: 91 mg/dL (ref 70–99)
Glucose-Capillary: 125 mg/dL — ABNORMAL HIGH (ref 70–99)
Glucose-Capillary: 152 mg/dL — ABNORMAL HIGH (ref 70–99)

## 2023-05-16 NOTE — ED Notes (Signed)
Pt given pm snack and beverage.

## 2023-05-16 NOTE — ED Notes (Signed)
VOL/Pending Placement

## 2023-05-16 NOTE — ED Notes (Signed)
CBG 96. RN Kim notified.

## 2023-05-16 NOTE — ED Notes (Signed)
Patient's blood sugar 152

## 2023-05-16 NOTE — ED Notes (Signed)
pATIENT ATE 100% OF LUNCH AND BEVERAGE.

## 2023-05-16 NOTE — ED Notes (Signed)

## 2023-05-16 NOTE — ED Provider Notes (Signed)
Emergency Medicine Observation Re-evaluation Note  Larry Cook is a 62 y.o. male, seen on rounds today.  Pt initially presented to the ED for complaints of Psychiatric Evaluation (IVC)  Currently, the patient is calm, no acute complaints.  Physical Exam  Blood pressure 130/81, pulse 68, temperature 97.6 F (36.4 C), temperature source Axillary, resp. rate 17, height 4\' 2"  (1.27 m), weight 90 kg, SpO2 98%. Physical Exam General: NAD Lungs: CTAB Psych: not agitated  ED Course / MDM  EKG:    I have reviewed the labs performed to date as well as medications administered while in observation.  Recent changes in the last 24 hours include no acute events overnight.    Plan  Current plan is for TOC placement.   Sharman Cheek, MD 05/16/23 [phone]

## 2023-05-16 NOTE — ED Notes (Signed)
Patient ate 100% of supper and beverage.

## 2023-05-16 NOTE — ED Notes (Signed)
Pt. Is VOL./ pending placement

## 2023-05-17 ENCOUNTER — Other Ambulatory Visit: Payer: Self-pay

## 2023-05-17 DIAGNOSIS — F209 Schizophrenia, unspecified: Secondary | ICD-10-CM | POA: Diagnosis not present

## 2023-05-17 LAB — CBG MONITORING, ED
Glucose-Capillary: 124 mg/dL — ABNORMAL HIGH (ref 70–99)
Glucose-Capillary: 96 mg/dL (ref 70–99)
Glucose-Capillary: 105 mg/dL — ABNORMAL HIGH (ref 70–99)
Glucose-Capillary: 158 mg/dL — ABNORMAL HIGH (ref 70–99)

## 2023-05-17 MED ORDER — DEXCOM G7 RECEIVER DEVI: 1.0000 | Freq: Every day | 1 refills | Status: AC | PRN

## 2023-05-17 MED ORDER — OLANZAPINE 10 MG PO TABS
10.0000 mg | ORAL_TABLET | Freq: Two times a day (BID) | ORAL | 11 refills | Status: AC
  Filled 2023-05-17: qty 60, 30d supply, fill #0

## 2023-05-17 MED ORDER — INSULIN GLARGINE 100 UNIT/ML SOLOSTAR PEN
7.0000 [IU] | PEN_INJECTOR | Freq: Every day | SUBCUTANEOUS | 11 refills | Status: AC
  Filled 2023-05-17: qty 15, 214d supply, fill #0

## 2023-05-17 MED ORDER — PALIPERIDONE ER 3 MG PO TB24
3.0000 mg | ORAL_TABLET | Freq: Every day | ORAL | 5 refills | Status: AC
  Filled 2023-05-17: qty 30, 30d supply, fill #0

## 2023-05-17 MED ORDER — DEXCOM G7 SENSOR MISC: 1.0000 | Freq: Every day | 1 refills | Status: AC | PRN

## 2023-05-17 MED ORDER — METFORMIN HCL ER 500 MG PO TB24
1000.0000 mg | ORAL_TABLET | Freq: Two times a day (BID) | ORAL | 2 refills | Status: AC
  Filled 2023-05-17: qty 120, 30d supply, fill #0

## 2023-05-17 MED ORDER — HALOPERIDOL 5 MG PO TABS
5.0000 mg | ORAL_TABLET | Freq: Every day | ORAL | 2 refills | Status: AC | PRN
Start: 2023-05-17 — End: ?
  Filled 2023-05-17: qty 30, 30d supply, fill #0

## 2023-05-17 MED ORDER — INSULIN LISPRO (1 UNIT DIAL) 100 UNIT/ML (KWIKPEN)
2.0000 [IU] | PEN_INJECTOR | Freq: Three times a day (TID) | SUBCUTANEOUS | 11 refills | Status: AC
  Filled 2023-05-17: qty 15, 250d supply, fill #0

## 2023-05-17 MED ORDER — RISPERIDONE 1 MG PO TABS
1.0000 mg | ORAL_TABLET | Freq: Every day | ORAL | 2 refills | Status: AC
  Filled 2023-05-17: qty 60, 60d supply, fill #0

## 2023-05-17 NOTE — ED Notes (Signed)
Safe  transport  called  for transport to  the  group home

## 2023-05-17 NOTE — ED Provider Notes (Addendum)
-----------------------------------------   7:24 AM on 05/17/2023 -----------------------------------------   Blood pressure 115/66, pulse 70, temperature 98 F (36.7 C), temperature source Oral, resp. rate 18, height 4\' 2"  (1.27 m), weight 90 kg, SpO2 99%.  The patient is calm and cooperative at this time.  There have been no acute events since the last update.  Patient has been cleared and safe for discharge to group home.  Medications were prescribed to him and follow-up with PCP instructed.   Janith Lima, MD 05/17/23 1610    Janith Lima, MD 05/17/23 [phone]

## 2023-05-17 NOTE — TOC Transition Note (Signed)
Transition of Care [facility]) - CM/SW Discharge Note   Patient Details  Name: Martise Waddell MRN: [id number] Date of Birth: Jan 09, 1961  Transition of Care [facility]) CM/SW Contact:  Marquita Palms, LCSW Phone Number: 05/17/2023, 12:53 PM   Clinical Narrative:     Patient to discharge today to [facility] with a 30 day supply of medication requested by the group home. CSW spoke with guardian Velvet to confirm his transportation option. CSW spoke with Synetta Fail at [facility] and she reported that she will be waiting for him to arrived. Nothing follows.   Final next level of care: Group Home Barriers to Discharge: No Barriers Identified   Patient Goals and CMS Choice      Discharge Placement                  Patient to be transferred to facility by: Safe Transport Name of family member notified: Riley Nearing (Sister)  [phone] [facility] Phone) Patient and family notified of of transfer: 05/16/23  Discharge Plan and Services Additional resources added to the After Visit Summary for Autism/IDD                                     Social Determinants of Health (SDOH) Interventions SDOH Screenings   Alcohol Screen: Low Risk  (05/16/2021)  Tobacco Use: Low Risk  (02/23/2023)     Readmission Risk Interventions     No data to display

## 2023-05-17 NOTE — TOC Progression Note (Signed)
Transition of Care [facility]) - Progression Note    Patient Details  Name: Larry Cook MRN: [id number] Date of Birth: 15-Aug-1961  Transition of Care [facility]) CM/SW Contact  Margarito Liner, LCSW Phone Number: 05/17/2023, 9:13 AM  Clinical Narrative:   Per [facility] coworker, [facility] is requesting 30 day supply of medications. CSW contacted the pharmacist to facilitate getting patient's prescriptions from [facility]. RN and MD are aware.  Expected Discharge Plan and Services                                               Social Determinants of Health (SDOH) Interventions SDOH Screenings   Alcohol Screen: Low Risk  (05/16/2021)  Tobacco Use: Low Risk  (02/23/2023)    Readmission Risk Interventions     No data to display

## 2023-05-17 NOTE — Discharge Instructions (Signed)
Patient was in the emergency department for his aggressive behavior.  Medications have been sent home with him.  Please keep a close eye on his diabetes and follow-up with his regular doctor for ongoing dosing instructions.

## 2023-05-17 NOTE — ED Notes (Signed)
BS 105 

## 2023-05-17 NOTE — ED Notes (Signed)
Pt discharging to [facility]. Discharge teaching done but pt didn't appear interested with teaching. Given all his personal belongings and his medications. Stable, in NAD and ambulating with steady gait.

## 2023-05-17 NOTE — ED Notes (Signed)
BS: 158

## 2023-08-12 ENCOUNTER — Encounter (INDEPENDENT_AMBULATORY_CARE_PROVIDER_SITE_OTHER): Payer: Self-pay | Admitting: *Deleted

## 2023-11-12 DIAGNOSIS — I10 Essential (primary) hypertension: Secondary | ICD-10-CM | POA: Diagnosis not present

## 2023-11-12 DIAGNOSIS — N183 Chronic kidney disease, stage 3 unspecified: Secondary | ICD-10-CM | POA: Diagnosis not present

## 2023-11-12 DIAGNOSIS — E785 Hyperlipidemia, unspecified: Secondary | ICD-10-CM | POA: Diagnosis not present

## 2023-11-12 DIAGNOSIS — E1122 Type 2 diabetes mellitus with diabetic chronic kidney disease: Secondary | ICD-10-CM | POA: Diagnosis not present

## 2023-12-13 DIAGNOSIS — E1122 Type 2 diabetes mellitus with diabetic chronic kidney disease: Secondary | ICD-10-CM | POA: Diagnosis not present

## 2023-12-13 DIAGNOSIS — I10 Essential (primary) hypertension: Secondary | ICD-10-CM | POA: Diagnosis not present

## 2024-01-12 DIAGNOSIS — E1322 Other specified diabetes mellitus with diabetic chronic kidney disease: Secondary | ICD-10-CM | POA: Diagnosis not present

## 2024-01-12 DIAGNOSIS — I10 Essential (primary) hypertension: Secondary | ICD-10-CM | POA: Diagnosis not present

## 2024-01-22 ENCOUNTER — Ambulatory Visit (INDEPENDENT_AMBULATORY_CARE_PROVIDER_SITE_OTHER): Admitting: Podiatry

## 2024-01-22 ENCOUNTER — Encounter: Payer: Self-pay | Admitting: Podiatry

## 2024-01-22 DIAGNOSIS — B351 Tinea unguium: Secondary | ICD-10-CM | POA: Diagnosis not present

## 2024-01-22 DIAGNOSIS — E1151 Type 2 diabetes mellitus with diabetic peripheral angiopathy without gangrene: Secondary | ICD-10-CM | POA: Diagnosis not present

## 2024-01-22 DIAGNOSIS — I70209 Unspecified atherosclerosis of native arteries of extremities, unspecified extremity: Secondary | ICD-10-CM

## 2024-01-22 DIAGNOSIS — M79672 Pain in left foot: Secondary | ICD-10-CM | POA: Diagnosis not present

## 2024-01-22 DIAGNOSIS — M79671 Pain in right foot: Secondary | ICD-10-CM

## 2024-01-22 NOTE — Progress Notes (Signed)
 Patient presents for evaluation and treatment of tenderness and some redness around nails feet.  Tenderness around toes with walking and wearing shoes.  Physical exam:  General appearance: Alert, pleasant, and in no acute distress.  Vascular: Pedal pulses: DP 2/4 B/L, PT 0/4 B/L.  Moderate edema lower legs bilaterally  Neurological:  No noted paresthesias or burning  Dermatologic:  Nails thickened, disfigured, discolored 1-5 BL with subungual debris.  Redness and hypertrophic nail folds along nail folds bilaterally but no signs of drainage or infection.  Musculoskeletal:  \   Diagnosis: 1. Painful onychomycotic nails 1 through 5 bilaterally. 2. Pain toes 1 through 5 bilaterally.  Plan: Debrided onychomycotic nails 1 through 5 bilaterally.  Return 3 months

## 2024-02-06 ENCOUNTER — Ambulatory Visit (HOSPITAL_COMMUNITY): Admitting: Psychiatry

## 2024-02-12 ENCOUNTER — Encounter (INDEPENDENT_AMBULATORY_CARE_PROVIDER_SITE_OTHER): Payer: Self-pay | Admitting: *Deleted

## 2024-02-12 DIAGNOSIS — E1322 Other specified diabetes mellitus with diabetic chronic kidney disease: Secondary | ICD-10-CM | POA: Diagnosis not present

## 2024-02-12 DIAGNOSIS — I10 Essential (primary) hypertension: Secondary | ICD-10-CM | POA: Diagnosis not present

## 2024-03-13 DIAGNOSIS — I10 Essential (primary) hypertension: Secondary | ICD-10-CM | POA: Diagnosis not present

## 2024-03-13 DIAGNOSIS — E1322 Other specified diabetes mellitus with diabetic chronic kidney disease: Secondary | ICD-10-CM | POA: Diagnosis not present

## 2024-04-01 DIAGNOSIS — H25813 Combined forms of age-related cataract, bilateral: Secondary | ICD-10-CM | POA: Diagnosis not present

## 2024-04-13 DIAGNOSIS — I10 Essential (primary) hypertension: Secondary | ICD-10-CM | POA: Diagnosis not present

## 2024-04-13 DIAGNOSIS — E1322 Other specified diabetes mellitus with diabetic chronic kidney disease: Secondary | ICD-10-CM | POA: Diagnosis not present

## 2024-04-27 ENCOUNTER — Ambulatory Visit: Admitting: Podiatry

## 2024-04-30 DIAGNOSIS — H40059 Ocular hypertension, unspecified eye: Secondary | ICD-10-CM | POA: Diagnosis not present

## 2024-04-30 DIAGNOSIS — H527 Unspecified disorder of refraction: Secondary | ICD-10-CM | POA: Diagnosis not present

## 2024-04-30 DIAGNOSIS — H25813 Combined forms of age-related cataract, bilateral: Secondary | ICD-10-CM | POA: Diagnosis not present

## 2024-05-01 DIAGNOSIS — Z23 Encounter for immunization: Secondary | ICD-10-CM | POA: Diagnosis not present

## 2024-05-14 DIAGNOSIS — E1322 Other specified diabetes mellitus with diabetic chronic kidney disease: Secondary | ICD-10-CM | POA: Diagnosis not present

## 2024-05-14 DIAGNOSIS — I10 Essential (primary) hypertension: Secondary | ICD-10-CM | POA: Diagnosis not present

## 2024-05-26 ENCOUNTER — Encounter: Payer: Self-pay | Admitting: *Deleted

## 2024-05-26 NOTE — Progress Notes (Signed)
 Larry Cook                                          MRN: [id number]   05/26/2024   The [facility] Quality Team Specialist reviewed this patient medical record for the purposes of chart review for care gap closure. The following were reviewed: chart review for care gap closure-kidney health evaluation for diabetes:eGFR  and uACR.    [facility] Quality Team

## 2024-05-28 ENCOUNTER — Ambulatory Visit: Admitting: Podiatry

## 2024-06-02 DIAGNOSIS — I10 Essential (primary) hypertension: Secondary | ICD-10-CM | POA: Diagnosis not present

## 2024-06-02 DIAGNOSIS — J45909 Unspecified asthma, uncomplicated: Secondary | ICD-10-CM | POA: Diagnosis not present

## 2024-06-02 DIAGNOSIS — Z0001 Encounter for general adult medical examination with abnormal findings: Secondary | ICD-10-CM | POA: Diagnosis not present

## 2024-06-02 DIAGNOSIS — E785 Hyperlipidemia, unspecified: Secondary | ICD-10-CM | POA: Diagnosis not present

## 2024-06-02 DIAGNOSIS — K219 Gastro-esophageal reflux disease without esophagitis: Secondary | ICD-10-CM | POA: Diagnosis not present

## 2024-06-02 DIAGNOSIS — E559 Vitamin D deficiency, unspecified: Secondary | ICD-10-CM | POA: Diagnosis not present

## 2024-06-02 DIAGNOSIS — Z1389 Encounter for screening for other disorder: Secondary | ICD-10-CM | POA: Diagnosis not present

## 2024-06-02 DIAGNOSIS — E1122 Type 2 diabetes mellitus with diabetic chronic kidney disease: Secondary | ICD-10-CM | POA: Diagnosis not present

## 2024-06-05 ENCOUNTER — Ambulatory Visit: Admitting: Podiatry

## 2024-06-17 ENCOUNTER — Ambulatory Visit (INDEPENDENT_AMBULATORY_CARE_PROVIDER_SITE_OTHER): Admitting: Podiatry

## 2024-06-17 DIAGNOSIS — M79671 Pain in right foot: Secondary | ICD-10-CM | POA: Diagnosis not present

## 2024-06-17 DIAGNOSIS — M79672 Pain in left foot: Secondary | ICD-10-CM

## 2024-06-17 DIAGNOSIS — B351 Tinea unguium: Secondary | ICD-10-CM | POA: Diagnosis not present

## 2024-06-17 NOTE — Progress Notes (Signed)
 Patient presents for evaluation and treatment of tenderness and some redness around nails feet.  Tenderness around toes with walking and wearing shoes.  Physical exam:  General appearance: Alert, pleasant, and in no acute distress.  Vascular: Pedal pulses: DP 2/4 B/L, PT 0/4 B/L. Moderate edema lower legs bilaterally  Neurologic:  Dermatologic:  Nails thickened, disfigured, discolored 1-5 BL with subungual debris.  Redness and hypertrophic nail folds along nail folds bilaterally but no signs of drainage or infection.  Musculoskeletal:     Diagnosis: 1. Painful onychomycotic nails 1 through 5 bilaterally. 2. Pain toes 1 through 5 bilaterally.  Plan: -Debrided onychomycotic nails 1 through 5 bilaterally.  Sharply debrided nails with nail clipper and reduced with a power bur.  Return 3 months [facility]

## 2024-06-18 ENCOUNTER — Other Ambulatory Visit (HOSPITAL_COMMUNITY)
Admission: RE | Admit: 2024-06-18 | Discharge: 2024-06-18 | Disposition: A | Discharge status: Home / Self Care | Source: Ambulatory Visit | Attending: Internal Medicine | Admitting: Internal Medicine

## 2024-06-18 DIAGNOSIS — I129 Hypertensive chronic kidney disease with stage 1 through stage 4 chronic kidney disease, or unspecified chronic kidney disease: Secondary | ICD-10-CM | POA: Diagnosis not present

## 2024-06-18 DIAGNOSIS — E1122 Type 2 diabetes mellitus with diabetic chronic kidney disease: Secondary | ICD-10-CM | POA: Insufficient documentation

## 2024-06-18 DIAGNOSIS — E1322 Other specified diabetes mellitus with diabetic chronic kidney disease: Secondary | ICD-10-CM | POA: Diagnosis present

## 2024-06-18 DIAGNOSIS — N183 Chronic kidney disease, stage 3 unspecified: Secondary | ICD-10-CM | POA: Insufficient documentation

## 2024-06-18 DIAGNOSIS — E785 Hyperlipidemia, unspecified: Secondary | ICD-10-CM | POA: Diagnosis not present

## 2024-06-18 LAB — CBC WITH DIFFERENTIAL/PLATELET
Abs Immature Granulocytes: 0.03 K/uL (ref 0.00–0.07)
Basophils Absolute: 0 K/uL (ref 0.0–0.1)
Basophils Relative: 0 %
Eosinophils Absolute: 0.1 K/uL (ref 0.0–0.5)
Eosinophils Relative: 1 %
HCT: 37.3 % — ABNORMAL LOW (ref 39.0–52.0)
Hemoglobin: 12.3 g/dL — ABNORMAL LOW (ref 13.0–17.0)
Immature Granulocytes: 0 %
Lymphocytes Relative: 29 %
Lymphs Abs: 2.3 K/uL (ref 0.7–4.0)
MCH: 31.1 pg (ref 26.0–34.0)
MCHC: 33 g/dL (ref 30.0–36.0)
MCV: 94.4 fL (ref 80.0–100.0)
Monocytes Absolute: 0.6 K/uL (ref 0.1–1.0)
Monocytes Relative: 7 %
Neutro Abs: 4.9 K/uL (ref 1.7–7.7)
Neutrophils Relative %: 63 %
Platelets: 270 K/uL (ref 150–400)
RBC: 3.95 MIL/uL — ABNORMAL LOW (ref 4.22–5.81)
RDW: 12.5 % (ref 11.5–15.5)
WBC: 7.8 K/uL (ref 4.0–10.5)
nRBC: 0 % (ref 0.0–0.2)

## 2024-06-18 LAB — LIPID PANEL
Cholesterol: 161 mg/dL (ref 0–200)
HDL: 69 mg/dL (ref >40)
LDL Cholesterol: 72 mg/dL (ref 0–99)
Total CHOL/HDL Ratio: 2.3 ratio
Triglycerides: 101 mg/dL (ref <150)
VLDL: 20 mg/dL (ref 0–40)

## 2024-06-18 LAB — HEPATIC FUNCTION PANEL
ALT: 29 U/L (ref 0–44)
AST: 35 U/L (ref 15–41)
Albumin: 4.2 g/dL (ref 3.5–5.0)
Alkaline Phosphatase: 195 U/L — ABNORMAL HIGH (ref 38–126)
Bilirubin, Direct: 0.1 mg/dL (ref 0.0–0.2)
Indirect Bilirubin: 0.2 mg/dL — ABNORMAL LOW (ref 0.3–0.9)
Total Bilirubin: 0.3 mg/dL (ref 0.0–1.2)
Total Protein: 8 g/dL (ref 6.5–8.1)

## 2024-06-18 LAB — HEMOGLOBIN A1C
Hgb A1c MFr Bld: 6.7 % — ABNORMAL HIGH (ref 4.8–5.6)
Mean Plasma Glucose: 145.59 mg/dL

## 2024-06-18 LAB — BASIC METABOLIC PANEL WITH GFR
Anion gap: 9 (ref 5–15)
BUN: 27 mg/dL — ABNORMAL HIGH (ref 8–23)
CO2: 30 mmol/L (ref 22–32)
Calcium: 10.4 mg/dL — ABNORMAL HIGH (ref 8.9–10.3)
Chloride: 103 mmol/L (ref 98–111)
Creatinine, Ser: 1.34 mg/dL — ABNORMAL HIGH (ref 0.61–1.24)
GFR, Estimated: 60 mL/min — ABNORMAL LOW (ref >60)
Glucose, Bld: 118 mg/dL — ABNORMAL HIGH (ref 70–99)
Potassium: 4.2 mmol/L (ref 3.5–5.1)
Sodium: 142 mmol/L (ref 135–145)

## 2024-06-19 LAB — MICROALBUMIN / CREATININE URINE RATIO
Creatinine, Urine: 53.1 mg/dL
Microalb Creat Ratio: 32 mg/g{creat} — ABNORMAL HIGH (ref 0–29)
Microalb, Ur: 17.2 ug/mL — ABNORMAL HIGH

## 2024-06-19 LAB — CALCITRIOL (1,25 DI-OH VIT D): Vit D, 1,25-Dihydroxy: 34.5 pg/mL (ref 24.8–81.5)

## 2024-09-15 ENCOUNTER — Ambulatory Visit: Admitting: Podiatry

## 2024-10-02 ENCOUNTER — Ambulatory Visit: Admitting: Podiatry
